# Patient Record
Sex: Female | Born: 1980
Health system: Southern US, Community
[De-identification: ages and names within clinical notes are randomized; demographics above are authoritative.]

## PROBLEM LIST (undated history)

## (undated) ENCOUNTER — Inpatient Hospital Stay (HOSPITAL_COMMUNITY): Payer: Self-pay

## (undated) DIAGNOSIS — N809 Endometriosis, unspecified: Secondary | ICD-10-CM

## (undated) DIAGNOSIS — Z862 Personal history of diseases of the blood and blood-forming organs and certain disorders involving the immune mechanism: Secondary | ICD-10-CM

## (undated) DIAGNOSIS — O24419 Gestational diabetes mellitus in pregnancy, unspecified control: Secondary | ICD-10-CM

## (undated) DIAGNOSIS — Z8742 Personal history of other diseases of the female genital tract: Secondary | ICD-10-CM

## (undated) DIAGNOSIS — F329 Major depressive disorder, single episode, unspecified: Secondary | ICD-10-CM

## (undated) DIAGNOSIS — Z973 Presence of spectacles and contact lenses: Secondary | ICD-10-CM

## (undated) DIAGNOSIS — E7800 Pure hypercholesterolemia, unspecified: Secondary | ICD-10-CM

## (undated) DIAGNOSIS — D649 Anemia, unspecified: Secondary | ICD-10-CM

## (undated) DIAGNOSIS — N801 Endometriosis of ovary: Secondary | ICD-10-CM

## (undated) DIAGNOSIS — F32A Depression, unspecified: Secondary | ICD-10-CM

## (undated) DIAGNOSIS — M7989 Other specified soft tissue disorders: Secondary | ICD-10-CM

## (undated) DIAGNOSIS — Z889 Allergy status to unspecified drugs, medicaments and biological substances status: Secondary | ICD-10-CM

## (undated) DIAGNOSIS — E785 Hyperlipidemia, unspecified: Secondary | ICD-10-CM

## (undated) DIAGNOSIS — F419 Anxiety disorder, unspecified: Secondary | ICD-10-CM

## (undated) DIAGNOSIS — O132 Gestational [pregnancy-induced] hypertension without significant proteinuria, second trimester: Secondary | ICD-10-CM

## (undated) DIAGNOSIS — E559 Vitamin D deficiency, unspecified: Secondary | ICD-10-CM

## (undated) DIAGNOSIS — E282 Polycystic ovarian syndrome: Secondary | ICD-10-CM

## (undated) HISTORY — DX: Vitamin D deficiency, unspecified: E55.9

## (undated) HISTORY — PX: OVARIAN CYST REMOVAL: SHX89

## (undated) HISTORY — DX: Other specified soft tissue disorders: M79.89

## (undated) HISTORY — DX: Pure hypercholesterolemia, unspecified: E78.00

## (undated) HISTORY — DX: Hyperlipidemia, unspecified: E78.5

## (undated) HISTORY — PX: LAPAROSCOPIC OVARIAN CYSTECTOMY: SUR786

## (undated) HISTORY — DX: Endometriosis, unspecified: N80.9

## (undated) HISTORY — DX: Anxiety disorder, unspecified: F41.9

## (undated) HISTORY — DX: Depression, unspecified: F32.A

## (undated) HISTORY — DX: Polycystic ovarian syndrome: E28.2

## (undated) HISTORY — PX: OTHER SURGICAL HISTORY: SHX169

## (undated) HISTORY — DX: Major depressive disorder, single episode, unspecified: F32.9

---

## 2011-12-11 HISTORY — PX: OTHER SURGICAL HISTORY: SHX169

## 2013-09-09 LAB — HM PAP SMEAR: HM Pap smear: NORMAL

## 2014-08-12 ENCOUNTER — Telehealth: Payer: Self-pay

## 2014-08-12 NOTE — Telephone Encounter (Signed)
PCP: [facility]  No previous records on file. Pt encouraged to have records faxed to office.  Pt only wants to establish care during visit.  Pt has already had her annual exam and labs.    Medication and allergies:  Reviewed and updated  90 day supply/mail order: n/a Local pharmacy:  [facility], [place] - [address]     Immunizations due:  Please see below  A/P: Personal, family history and past surgical hx: Reviewed and updated PAP- 09/2013 Flu- pt states she has already received for work Tdap- 03/2014   To Discuss with Provider: Nothing at this time.

## 2014-08-13 ENCOUNTER — Ambulatory Visit: Payer: Self-pay | Admitting: Family Medicine

## 2014-11-22 ENCOUNTER — Encounter: Payer: Self-pay | Admitting: Family Medicine

## 2014-11-22 ENCOUNTER — Ambulatory Visit (INDEPENDENT_AMBULATORY_CARE_PROVIDER_SITE_OTHER): Payer: 59 | Admitting: Family Medicine

## 2014-11-22 VITALS — BP 120/80 | HR 76 | Temp 98.2°F | Resp 16 | Ht 62.0 in | Wt 229.0 lb

## 2014-11-22 DIAGNOSIS — E669 Obesity, unspecified: Secondary | ICD-10-CM | POA: Insufficient documentation

## 2014-11-22 DIAGNOSIS — N926 Irregular menstruation, unspecified: Secondary | ICD-10-CM

## 2014-11-22 DIAGNOSIS — Z6838 Body mass index (BMI) 38.0-38.9, adult: Secondary | ICD-10-CM | POA: Insufficient documentation

## 2014-11-22 DIAGNOSIS — F411 Generalized anxiety disorder: Secondary | ICD-10-CM

## 2014-11-22 DIAGNOSIS — E785 Hyperlipidemia, unspecified: Secondary | ICD-10-CM | POA: Insufficient documentation

## 2014-11-22 NOTE — Progress Notes (Signed)
   Subjective:    Patient ID: Cynthia [name], female    DOB: 1981/01/28, 33 y.o.   MRN: [id number]  HPI New to establish.  Previous MD- Lin Landsman  Hyperlipidemia- chronic problem, on Pravastatin.  + family hx.  Denies abd pain, N/V, myalgias.  Anxiety- chronic problem.  Started shortly after father's death.  Came off meds and then restarted.  On Citalopram 10 mg daily (1/2 tab)  Irregular menses- ongoing issue for pt.  Pt is attempting to get pregnant.  Has tried for ~6 months.  Obese- pt is interested in seeing Nutritionist.  'massive weight gain' in the last 4 yrs.  Was exercising regularly up until recently.  Has gained ~100 lbs.  Review of Systems For ROS see HPI     Objective:   Physical Exam  Constitutional: She is oriented to person, place, and time. She appears well-developed and well-nourished. No distress.  obese  HENT:  Head: Normocephalic and atraumatic.  Eyes: Conjunctivae and EOM are normal. Pupils are equal, round, and reactive to light.  Neck: Normal range of motion. Neck supple. No thyromegaly present.  Cardiovascular: Normal rate, regular rhythm, normal heart sounds and intact distal pulses.   No murmur heard. Pulmonary/Chest: Effort normal and breath sounds normal. No respiratory distress.  Abdominal: Soft. She exhibits no distension. There is no tenderness.  Musculoskeletal: She exhibits no edema.  Lymphadenopathy:    She has no cervical adenopathy.  Neurological: She is alert and oriented to person, place, and time.  Skin: Skin is warm and dry.  Psychiatric: She has a normal mood and affect. Her behavior is normal.  Vitals reviewed.         Assessment & Plan:

## 2014-11-22 NOTE — Progress Notes (Signed)
Pre visit review using our clinic review tool, if applicable. No additional management support is needed unless otherwise documented below in the visit note. 

## 2014-11-22 NOTE — Patient Instructions (Signed)
Schedule your complete physical in 6 months Call [facility] and schedule an appt w/ one of their nutritionists We'll call you with your OB appt We'll notify you of your lab results and make any changes if needed Try and make healthy food choices and get regular exercise Call with any questions or concerns Welcome!  We're glad to have you!!! Happy Holidays!!!

## 2014-11-23 ENCOUNTER — Encounter: Payer: Self-pay | Admitting: Family Medicine

## 2014-11-23 LAB — CBC WITH DIFFERENTIAL/PLATELET
BASOS ABS: 0 10*3/uL (ref 0.0–0.1)
BASOS PCT: 0.3 % (ref 0.0–3.0)
EOS ABS: 0.3 10*3/uL (ref 0.0–0.7)
Eosinophils Relative: 3.4 % (ref 0.0–5.0)
HCT: 35.8 % — ABNORMAL LOW (ref 36.0–46.0)
HEMOGLOBIN: 11.6 g/dL — AB (ref 12.0–15.0)
LYMPHS PCT: 25.1 % (ref 12.0–46.0)
Lymphs Abs: 2.5 10*3/uL (ref 0.7–4.0)
MCHC: 32.5 g/dL (ref 30.0–36.0)
MCV: 78.4 fl (ref 78.0–100.0)
MONOS PCT: 4.8 % (ref 3.0–12.0)
Monocytes Absolute: 0.5 10*3/uL (ref 0.1–1.0)
Neutro Abs: 6.6 10*3/uL (ref 1.4–7.7)
Neutrophils Relative %: 66.4 % (ref 43.0–77.0)
Platelets: 405 10*3/uL — ABNORMAL HIGH (ref 150.0–400.0)
RBC: 4.57 Mil/uL (ref 3.87–5.11)
RDW: 16.1 % — AB (ref 11.5–15.5)
WBC: 9.9 10*3/uL (ref 4.0–10.5)

## 2014-11-23 LAB — LDL CHOLESTEROL, DIRECT: Direct LDL: 148.5 mg/dL

## 2014-11-23 LAB — BASIC METABOLIC PANEL
BUN: 10 mg/dL (ref 6–23)
CALCIUM: 9.2 mg/dL (ref 8.4–10.5)
CO2: 21 meq/L (ref 19–32)
Chloride: 106 mEq/L (ref 96–112)
Creatinine, Ser: 0.7 mg/dL (ref 0.4–1.2)
GFR: 100.04 mL/min (ref >60.00)
GLUCOSE: 80 mg/dL (ref 70–99)
Potassium: 4 mEq/L (ref 3.5–5.1)
SODIUM: 137 meq/L (ref 135–145)

## 2014-11-23 LAB — HEPATIC FUNCTION PANEL
ALBUMIN: 4.3 g/dL (ref 3.5–5.2)
ALT: 20 U/L (ref 0–35)
AST: 22 U/L (ref 0–37)
Alkaline Phosphatase: 68 U/L (ref 39–117)
Bilirubin, Direct: 0 mg/dL (ref 0.0–0.3)
TOTAL PROTEIN: 7.6 g/dL (ref 6.0–8.3)
Total Bilirubin: 0.3 mg/dL (ref 0.2–1.2)

## 2014-11-23 LAB — LIPID PANEL
CHOLESTEROL: 214 mg/dL — AB (ref 0–200)
HDL: 38 mg/dL — AB (ref >39.00)
NonHDL: 176
TRIGLYCERIDES: 203 mg/dL — AB (ref 0.0–149.0)
Total CHOL/HDL Ratio: 6
VLDL: 40.6 mg/dL — AB (ref 0.0–40.0)

## 2014-11-23 LAB — TSH: TSH: 2.22 u[IU]/mL (ref 0.35–4.50)

## 2014-11-23 NOTE — Assessment & Plan Note (Signed)
New.  Check labs to risk stratify.  Stressed need for healthy diet, regular exercise.  Discussed plan for pt to see nutritionist- names and #s provided.  Will follow.

## 2014-11-23 NOTE — Assessment & Plan Note (Signed)
Chronic problem.  Tolerating statin w/o difficulty.  Admits to excessive recent weight gain and limited exercise.  Pt plans to get back on track w/ both.  She would like to get off meds b/c she would like to get pregnant.  Will follow.

## 2014-11-23 NOTE — Assessment & Plan Note (Signed)
New to provider, ongoing for pt.  Will refer to OB/GYN as pt desires to get pregnant.

## 2014-11-23 NOTE — Assessment & Plan Note (Signed)
New to provider, ongoing for pt.  Adequate control.  Pt is considering weaning off meds in the near future but w/ recent career change, she is not going to do it now.  Will follow.

## 2014-11-24 ENCOUNTER — Encounter: Payer: Self-pay | Admitting: General Practice

## 2014-12-08 ENCOUNTER — Telehealth: Payer: Self-pay | Admitting: Family Medicine

## 2014-12-08 ENCOUNTER — Encounter: Payer: Self-pay | Admitting: Family Medicine

## 2014-12-08 NOTE — Telephone Encounter (Signed)
Ok for referral?

## 2014-12-08 NOTE — Telephone Encounter (Signed)
Caller name:Glandon, Alandria Relation to MQ:TTCN Call back number:[phone] Pharmacy:  Reason for call: pt states she needs a referral  For the nutritionist, pt has [facility], [facility] [phone].

## 2014-12-08 NOTE — Telephone Encounter (Signed)
Referral placed.

## 2014-12-18 ENCOUNTER — Ambulatory Visit (INDEPENDENT_AMBULATORY_CARE_PROVIDER_SITE_OTHER): Payer: 59 | Admitting: Family Medicine

## 2014-12-18 ENCOUNTER — Encounter: Payer: Self-pay | Admitting: Family Medicine

## 2014-12-18 VITALS — BP 102/78 | HR 97 | Temp 98.1°F | Wt 229.0 lb

## 2014-12-18 DIAGNOSIS — N921 Excessive and frequent menstruation with irregular cycle: Secondary | ICD-10-CM

## 2014-12-18 DIAGNOSIS — D508 Other iron deficiency anemias: Secondary | ICD-10-CM

## 2014-12-18 MED ORDER — MEDROXYPROGESTERONE ACETATE 10 MG PO TABS: 10.0000 mg | ORAL_TABLET | Freq: Two times a day (BID) | ORAL | Status: DC

## 2014-12-18 NOTE — Progress Notes (Addendum)
[facility] PCP Annye Asa, MD   Subjective:  Cynthia [name] is a 34 y.o. year old very pleasant female patient who presents with prolonged menstrual bleeding. She is present with her husband Dr. Tomi Likens of neurology.  LMP 11/30/14. Initially very heavy with chanign tampon every hour but has slowed to 4-5 tampons per day. Patient had an ob/gyn appointment scheduled but this had to be rescheduled for uncear reasons. When she was in the office, she mentioned her complaints and was advised to take an iron which she is taking 1 a day of. She states they did this based off a finger prick where they told her that her iron was low (? If this was POC CBC). Patient denies hgb being checked. Patient is not on birth control and states was last on them in 2004. No children. Periods susspicious for anovulatory bleeding as occur about ever 2 months and heavy for about 4 days. Denies history of PCOS. She has been able to go to work. She does get intermittently dizzy but is not dizzy with standing. Mild difficulty concentrating. Mild swelling in hands and at ankles. Patient with known mild anemia on last CBC.   ROS-denies  Shortness of breath or dyspnea on exertion. Patient is currently experiencing some URI symptoms which are improving and does have some fatigue (difficult to tease out level of fatigue and also more difficult to interpret given URI). She has no chest pain.   Past Medical History- history BPPV,  Anxiety, hyperlipidemia  Medications- reviewed  Current Outpatient Prescriptions  Medication Sig Dispense Refill  . citalopram (CELEXA) 20 MG tablet Take 0.5 tablets by mouth daily.  4  . pravastatin (PRAVACHOL) 40 MG tablet Take 1 tablet by mouth daily.  3   No current facility-administered medications for this visit.    Objective: BP 102/78 mmHg  Pulse 97  Temp(Src) 98.1 F (36.7 C) (Oral)  Wt 229 lb (103.874 kg)  SpO2 97% Gen: NAD, resting comfortably HEENT: only  mild mucus membrane pallor CV: RRR no murmurs rubs or gallops Lungs: CTAB no crackles, wheeze, rhonchi Ext:  Trace to 1+ pitting edema as well as swelling in hands Neuro: walks without difficulty   Assessment/Plan:  Prolonged menstrual bleeding Provera 10mg  BID. Suspicious for anovulatory bleeding and unopposed estrogen. Progesterone to stabilize uterine lining. We do not have hgb/cbc capability in office today. Given improving frequency of bleeding, we decided to hold off on sending to ED as long as no worsening of fatigue and if patient continues without DOE, SOB, chest pain. Close follow up Monday with Dr. Birdie Riddle. Patient does have BPPV and intermittent dizziness which seems to be worse with anemia-we discussed seeking care if this increased in frequncy before Monday.   Regarding anemia, baseline hgb at 11.6 with MCV at 78.4 and elevated RDW suspicious for possible baseline anemia. Patient states her iron level was 8 at ob/gyn (hopeful this was in fact iron and not hgb level). I encouraged her to continue her iron and suspect Dr. Birdie Riddle will increase frequency.   Meds ordered this encounter  Medications  . medroxyPROGESTERone (PROVERA) 10 MG tablet    Sig: Take 1 tablet (10 mg total) by mouth 2 (two) times daily.    Dispense:  20 tablet    Refill:  0

## 2014-12-18 NOTE — Patient Instructions (Signed)
Provera for at least 5 days twice a day. Take 10 days if not fully resolved.   See Dr. Birdie Riddle this week. Likely will get a CBC/hgb to check your blood counts.   Continue the iron.   If you had worsening dizziness, new chest pain or shortness of breath or worsening fatigue consider going to an  ED where they can check your hgb and transfuse if needed.

## 2014-12-18 NOTE — Progress Notes (Signed)
Pre visit review using our clinic review tool, if applicable. No additional management support is needed unless otherwise documented below in the visit note. 

## 2014-12-20 ENCOUNTER — Telehealth: Payer: Self-pay | Admitting: Family Medicine

## 2014-12-20 ENCOUNTER — Ambulatory Visit (INDEPENDENT_AMBULATORY_CARE_PROVIDER_SITE_OTHER): Payer: 59 | Admitting: Medical

## 2014-12-20 VITALS — BP 119/80 | HR 109 | Temp 98.5°F | Ht 62.0 in | Wt 229.2 lb

## 2014-12-20 DIAGNOSIS — D6489 Other specified anemias: Secondary | ICD-10-CM

## 2014-12-20 DIAGNOSIS — J069 Acute upper respiratory infection, unspecified: Secondary | ICD-10-CM

## 2014-12-20 DIAGNOSIS — D649 Anemia, unspecified: Secondary | ICD-10-CM | POA: Insufficient documentation

## 2014-12-20 DIAGNOSIS — N921 Excessive and frequent menstruation with irregular cycle: Secondary | ICD-10-CM | POA: Insufficient documentation

## 2014-12-20 LAB — CBC WITH DIFFERENTIAL/PLATELET
Basophils Absolute: 0 10*3/uL (ref 0.0–0.1)
Basophils Relative: 0 % (ref 0–1)
Eosinophils Absolute: 0.3 10*3/uL (ref 0.0–0.7)
Eosinophils Relative: 3 % (ref 0–5)
HCT: 29.3 % — ABNORMAL LOW (ref 36.0–46.0)
Hemoglobin: 9.6 g/dL — ABNORMAL LOW (ref 12.0–15.0)
LYMPHS ABS: 2.9 10*3/uL (ref 0.7–4.0)
LYMPHS PCT: 32 % (ref 12–46)
MCH: 25.9 pg — ABNORMAL LOW (ref 26.0–34.0)
MCHC: 32.8 g/dL (ref 30.0–36.0)
MCV: 79 fL (ref 78.0–100.0)
MONOS PCT: 6 % (ref 3–12)
MPV: 8.1 fL — AB (ref 8.6–12.4)
Monocytes Absolute: 0.6 10*3/uL (ref 0.1–1.0)
Neutro Abs: 5.4 10*3/uL (ref 1.7–7.7)
Neutrophils Relative %: 59 % (ref 43–77)
Platelets: 468 10*3/uL — ABNORMAL HIGH (ref 150–400)
RBC: 3.71 MIL/uL — ABNORMAL LOW (ref 3.87–5.11)
RDW: 16.8 % — AB (ref 11.5–15.5)
WBC: 9.2 10*3/uL (ref 4.0–10.5)

## 2014-12-20 LAB — IRON AND TIBC
%SAT: 5 % — ABNORMAL LOW (ref 20–55)
Iron: 22 ug/dL — ABNORMAL LOW (ref 42–145)
TIBC: 430 ug/dL (ref 250–470)
UIBC: 408 ug/dL — ABNORMAL HIGH (ref 125–400)

## 2014-12-20 LAB — FOLATE: Folate: >20 ng/mL

## 2014-12-20 LAB — VITAMIN B12: Vitamin B-12: 475 pg/mL (ref 211–911)

## 2014-12-20 LAB — FERRITIN: Ferritin: 10 ng/mL (ref 10–291)

## 2014-12-20 NOTE — Assessment & Plan Note (Signed)
For your uri type symptoms continue mucinex and flonase. If you have sinus pressure/pain, productive cough or ear pain let me know and would rx antibiotic. I don't think antibiotics indicated presently.

## 2014-12-20 NOTE — Progress Notes (Signed)
Pre visit review using our clinic review tool, if applicable. No additional management support is needed unless otherwise documented below in the visit note. 

## 2014-12-20 NOTE — Patient Instructions (Addendum)
For your irregular and  heavy menses, I will get stat cbc and anemia profile studies.  Continue the provera and follow up with your gyn on the 19th.  If your bleeding were to worsen before the gyn appointment then ED evaluation.  For your uri type symptoms continue mucinex and flonase. If you have sinus pressure/pain, productive cough or ear pain let me know and would rx antibiotic. I don't think antibiotics indicated presently.  Follow up in 3 wks pcp or as needed.  Repeat cbc stat on Wed am. Will actually have lpn ask pt to make appointnment that day so can check bs along with repeat cbc.

## 2014-12-20 NOTE — Assessment & Plan Note (Signed)
For your irregular and  heavy menses, I will get stat cbc and anemia profile studies.  Continue the provera and follow up with your gyn on the 19th.  If your bleeding were to worsen before the gyn appointment then ED evaluation.

## 2014-12-20 NOTE — Assessment & Plan Note (Addendum)
Recently made worse by heavy menses. Reviewed her lab today. We will need to repeat her cbc on Thursday morning and assure that hb/hct is stable. Make sure not dropping further. She is now on iron and provera has decreased the bleeding. If continuous drop in hb/hct then would likely need transfusion.

## 2014-12-20 NOTE — Telephone Encounter (Signed)
Pt anemia level needs to be checked. Placing cbc order to be done again on wed this week.

## 2014-12-20 NOTE — Progress Notes (Signed)
Subjective:    Patient ID: Cynthia [name], female    DOB: 02/10/1981, 34 y.o.   MRN: [id number]  HPI   LMP- started on December 22nd. Pt states states hx of irregular cycles. And was very heavy cycle on 22nd. Heavy cycle and  Dr. Yong Channel saw pt weekend clinic started her on provera 10 mg and he wrote her for 7-10 days. Pt bleeding decreased significantly. Before she went to see Dr. Geoffry Paradise she was changing tampons every hour. But since then Saturday after starting provera. She only changes tampon twice since that day. Pt had appointment with gyn set up with them on last Monday. Some confusion on that appointment. So she has reschedule on the 19 th. Pt has some dizziness. Pt has been taking iron supplements. Pt denies hx of anemia. No history of uterine fibroids.  Pt states on Monday last week checked and she was not pregnant.  Pt feels mild fatigue and dizzy.   But also over past 10 days, cough, congestion, runny nose, pnd. andnd subjective fever. Some sneezing and no itchy eyes.  Past Medical History  Diagnosis Date  . Ovarian cyst   . Depression   . Anxiety   . Hypertension   . Hyperlipidemia   . Endometriosis     History   Social History  . Marital Status: Married    Spouse Name: N/A    Number of Children: N/A  . Years of Education: N/A   Occupational History  . Not on file.   Social History Main Topics  . Smoking status: Former Smoker    Quit date: 11/23/2011  . Smokeless tobacco: Not on file  . Alcohol Use: Yes  . Drug Use: No  . Sexual Activity: Not on file   Other Topics Concern  . Not on file   Social History Narrative    Past Surgical History  Procedure Laterality Date  . Ovarian cyst removal      Family History  Problem Relation Age of Onset  . Cancer Mother     breast  . Diabetes Mother     and mother's side of family  . Hyperlipidemia Mother   . Cancer Father     thyroid  . Heart disease Maternal Uncle   . Hyperlipidemia Maternal Grandfather     . Hypertension Maternal Grandfather   . Diabetes Maternal Grandfather     No Known Allergies  Current Outpatient Prescriptions on File Prior to Visit  Medication Sig Dispense Refill  . citalopram (CELEXA) 20 MG tablet Take 0.5 tablets by mouth daily.  4  . medroxyPROGESTERone (PROVERA) 10 MG tablet Take 1 tablet (10 mg total) by mouth 2 (two) times daily. 20 tablet 0  . pravastatin (PRAVACHOL) 40 MG tablet Take 1 tablet by mouth daily.  3   No current facility-administered medications on file prior to visit.    BP 119/80 mmHg  Pulse 109  Temp(Src) 98.5 F (36.9 C) (Oral)  Ht 5\' 2"  (1.575 m)  Wt 229 lb 3.2 oz (103.964 kg)  BMI 41.91 kg/m2  SpO2 98%      Review of Systems  Constitutional: Positive for fatigue. Negative for fever and chills.       Subjective fever early on.  HENT: Positive for congestion and rhinorrhea. Negative for dental problem, drooling, ear discharge and ear pain.   Respiratory: Positive for cough.   Cardiovascular: Negative for chest pain and palpitations.  Gastrointestinal: Negative for abdominal pain, diarrhea, constipation, blood in stool, abdominal distention  and anal bleeding.  Neurological: Positive for dizziness.       Objective:   Physical Exam   General  Mental Status - Alert. General Appearance - Well groomed. Not in acute distress.  Skin Rashes- No Rashes. Not pale appearing skin.  HEENT Head- Normal. Ear Auditory Canal - Left- Normal. Right - Normal.Tympanic Membrane- Left- Normal. Right- Normal. Eye Sclera/Conjunctiva- Left- Normal. Right- Normal. Nose & Sinuses Nasal Mucosa- Left-  Mild boggy + Congested. Right-  Mild boggy + Congested. No sinus pressure. Mouth & Throat Lips: Upper Lip- Normal: no dryness, cracking, pallor, cyanosis, or vesicular eruption. Lower Lip-Normal: no dryness, cracking, pallor, cyanosis or vesicular eruption. Buccal Mucosa- Bilateral- No Aphthous ulcers. Oropharynx- No Discharge or  Erythema. Tonsils: Characteristics- Bilateral- No Erythema or Congestion. Size/Enlargement- Bilateral- No enlargement. Discharge- bilateral-None.  Neck Neck- Supple. No Masses.   Chest and Lung Exam Auscultation: Breath Sounds:- even and unlabored.  Cardiovascular Auscultation:Rythm- Regular, rate and rhythm. Murmurs & Other Heart Sounds:Ausculatation of the heart reveal- No Murmurs.  Lymphatic Head & Neck General Head & Neck Lymphatics: Bilateral: Description- No Localized lymphadenopathy.   Abdomen Inspection:-Inspection Normal.  Palpation/Perucssion: Palpation and Percussion of the abdomen reveal- Non Tender, No Rebound tenderness, No rigidity(Guarding) and No Palpable abdominal masses.  Liver:-Normal.  Spleen:- Normal.    Back- no cva tenderness. .        Assessment & Plan:

## 2014-12-21 NOTE — Telephone Encounter (Signed)
Patient has appointment scheduled for Wednesday 7am for labs and 3:30 pm for OV. States she has to come before and after classes.

## 2014-12-22 ENCOUNTER — Ambulatory Visit: Payer: 59 | Admitting: Medical

## 2014-12-22 ENCOUNTER — Other Ambulatory Visit: Payer: 59

## 2014-12-23 ENCOUNTER — Ambulatory Visit (INDEPENDENT_AMBULATORY_CARE_PROVIDER_SITE_OTHER): Payer: 59 | Admitting: Medical

## 2014-12-23 ENCOUNTER — Encounter: Payer: Self-pay | Admitting: Medical

## 2014-12-23 ENCOUNTER — Other Ambulatory Visit: Payer: 59

## 2014-12-23 ENCOUNTER — Telehealth: Payer: Self-pay | Admitting: Family Medicine

## 2014-12-23 VITALS — BP 148/84 | HR 103 | Temp 98.1°F | Ht 62.0 in | Wt 229.4 lb

## 2014-12-23 DIAGNOSIS — D6489 Other specified anemias: Secondary | ICD-10-CM

## 2014-12-23 DIAGNOSIS — D649 Anemia, unspecified: Secondary | ICD-10-CM

## 2014-12-23 DIAGNOSIS — N921 Excessive and frequent menstruation with irregular cycle: Secondary | ICD-10-CM

## 2014-12-23 LAB — CBC WITH DIFFERENTIAL/PLATELET
BASOS ABS: 0 10*3/uL (ref 0.0–0.1)
BASOS PCT: 0 % (ref 0–1)
EOS PCT: 3 % (ref 0–5)
Eosinophils Absolute: 0.3 10*3/uL (ref 0.0–0.7)
HCT: 29.1 % — ABNORMAL LOW (ref 36.0–46.0)
HEMOGLOBIN: 9.5 g/dL — AB (ref 12.0–15.0)
LYMPHS ABS: 3 10*3/uL (ref 0.7–4.0)
LYMPHS PCT: 33 % (ref 12–46)
MCH: 26 pg (ref 26.0–34.0)
MCHC: 32.6 g/dL (ref 30.0–36.0)
MCV: 79.7 fL (ref 78.0–100.0)
MONOS PCT: 5 % (ref 3–12)
MPV: 8.1 fL — ABNORMAL LOW (ref 8.6–12.4)
Monocytes Absolute: 0.5 10*3/uL (ref 0.1–1.0)
NEUTROS PCT: 59 % (ref 43–77)
Neutro Abs: 5.4 10*3/uL (ref 1.7–7.7)
Platelets: 494 10*3/uL — ABNORMAL HIGH (ref 150–400)
RBC: 3.65 MIL/uL — ABNORMAL LOW (ref 3.87–5.11)
RDW: 16.6 % — AB (ref 11.5–15.5)
WBC: 9.1 10*3/uL (ref 4.0–10.5)

## 2014-12-23 MED ORDER — FERROUS SULFATE 325 (65 FE) MG PO TABS: 325.0000 mg | ORAL_TABLET | Freq: Three times a day (TID) | ORAL | Status: DC

## 2014-12-23 NOTE — Progress Notes (Signed)
Pre visit review using our clinic review tool, if applicable. No additional management support is needed unless otherwise documented below in the visit note. 

## 2014-12-23 NOTE — Patient Instructions (Addendum)
For your anemia related to your heavy cycle. I want you to take the iron I prescribed 3 times daily. Continue the provera. Repeat cbc stat on Monday morning. If during the interim you start bleeding heavy or increased fatigue then ED evaluation. We will let you know on Monday regarding  your lab results. Tuesday after your gyn appointment please call Santiago Glad LPN and let us know what gyn found. Follow up Thursday next week with me.

## 2014-12-23 NOTE — Telephone Encounter (Signed)
error:[id number] ° °

## 2014-12-23 NOTE — Progress Notes (Signed)
Subjective:    Patient ID: Cynthia [name], female    DOB: 04-Jul-1981, 34 y.o.   MRN: [id number]  HPI   Pt in stating still mild fatigue. Not better or worse. But also one night only got 3 hours of sleep. Pt vaginal bleeding did stop but then started bleeding again. She stopped provera yesterday. Tuesday on had only streaking. Pt taking otc iron but this is mothers. Pt stopped the provera yesterday but then bleeding restarted today. Pt has very little bleeding(1/2 a teas spoon) today. Pt restared provera today and already stopped. Pt has enough to last until she see gyn on this coming Tuesday.  When I checked her cbc initially it was 3 days after seen at [facility]. She had 1 dose of provera and iron at that time.   Past Medical History  Diagnosis Date  . Ovarian cyst   . Depression   . Anxiety   . Hypertension   . Hyperlipidemia   . Endometriosis     History   Social History  . Marital Status: Married    Spouse Name: N/A    Number of Children: N/A  . Years of Education: N/A   Occupational History  . Not on file.   Social History Main Topics  . Smoking status: Former Smoker    Quit date: 11/23/2011  . Smokeless tobacco: Not on file  . Alcohol Use: Yes  . Drug Use: No  . Sexual Activity: Not on file   Other Topics Concern  . Not on file   Social History Narrative    Past Surgical History  Procedure Laterality Date  . Ovarian cyst removal      Family History  Problem Relation Age of Onset  . Cancer Mother     breast  . Diabetes Mother     and mother's side of family  . Hyperlipidemia Mother   . Cancer Father     thyroid  . Heart disease Maternal Uncle   . Hyperlipidemia Maternal Grandfather   . Hypertension Maternal Grandfather   . Diabetes Maternal Grandfather     No Known Allergies  Current Outpatient Prescriptions on File Prior to Visit  Medication Sig Dispense Refill  . citalopram (CELEXA) 20 MG tablet Take 0.5 tablets by mouth daily.  4  .  medroxyPROGESTERone (PROVERA) 10 MG tablet Take 1 tablet (10 mg total) by mouth 2 (two) times daily. 20 tablet 0  . pravastatin (PRAVACHOL) 40 MG tablet Take 1 tablet by mouth daily.  3   No current facility-administered medications on file prior to visit.    BP 148/84 mmHg  Pulse 103  Temp(Src) 98.1 F (36.7 C) (Oral)  Ht 5\' 2"  (1.575 m)  Wt 229 lb 6.4 oz (104.055 kg)  BMI 41.95 kg/m2  SpO2 100%  LMP 12/23/2014      Review of Systems  Constitutional: Positive for fatigue. Negative for fever and chills.       Mild.  Respiratory: Negative for cough, chest tightness, shortness of breath and wheezing.   Cardiovascular: Negative for chest pain and palpitations.  Gastrointestinal: Negative for nausea, abdominal pain, diarrhea, constipation, blood in stool, anal bleeding and rectal pain.  Genitourinary: Positive for vaginal bleeding and menstrual problem. Negative for dysuria, frequency, hematuria, flank pain, vaginal discharge, enuresis, difficulty urinating, genital sores, vaginal pain and dyspareunia.       See hop.  Neurological: Positive for dizziness. Negative for seizures, syncope, speech difficulty, weakness, light-headedness, numbness and headaches.  Hematological: Negative for adenopathy.  Does not bruise/bleed easily.       Objective:   Physical Exam   General Mental Status- Alert. General Appearance- Not in acute distress.   Skin General: Color- Normal Color. Moisture- Normal Moisture. No pale appearance.  Neck Carotid Arteries- Normal color. Moisture- Normal Moisture. No carotid bruits. No JVD.  Chest and Lung Exam Auscultation: Breath Sounds:-Normal.  Cardiovascular Auscultation:Rythm- Regular. Murmurs & Other Heart Sounds:Auscultation of the heart reveals- No Murmurs.  Abdomen Inspection:-Inspeection Normal. Palpation/Percussion:Note:No mass. Palpation and Percussion of the abdomen reveal- Non Tender, Non Distended + BS, no rebound or  guarding.    Neurologic Cranial Nerve exam:- CN III-XII intact(No nystagmus), symmetric smile. Romberg Exam:- Negative.  Heal to Toe Gait exam:-Normal.          Assessment & Plan:

## 2014-12-23 NOTE — Assessment & Plan Note (Addendum)
Bleeding for most part stopped. Her anemia is stable with no drop in 2 days. I do not think at transfusion level.(most part looks good and hemodynamically stable.) For your anemia related to your heavy cycle. I want you to take the iron I prescribed 3 times daily. Continue the provera. Repeat cbc stat on Monday morning. If during the interim you start bleeding heavy or increased fatigue then ED evaluation. We will let you know on Monday regarding  your lab results. Tuesday after your gyn appointment please call Santiago Glad LPN and let us know what gyn found. Follow up Thursday next week with me.  On recheck in 2 days hoping and expected mild increae hb/hct. And maybe get result of possible pelvic US by gyn on Tuesday.

## 2014-12-24 ENCOUNTER — Other Ambulatory Visit: Payer: Self-pay | Admitting: General Practice

## 2014-12-24 MED ORDER — PRAVASTATIN SODIUM 40 MG PO TABS: 40.0000 mg | ORAL_TABLET | Freq: Every day | ORAL | Status: DC

## 2014-12-27 ENCOUNTER — Other Ambulatory Visit: Payer: 59

## 2014-12-27 ENCOUNTER — Telehealth: Payer: Self-pay | Admitting: Medical

## 2014-12-27 DIAGNOSIS — D62 Acute posthemorrhagic anemia: Secondary | ICD-10-CM

## 2014-12-27 DIAGNOSIS — D508 Other iron deficiency anemias: Secondary | ICD-10-CM

## 2014-12-27 LAB — CBC WITH DIFFERENTIAL/PLATELET
Basophils Absolute: 0 10*3/uL (ref 0.0–0.1)
Basophils Relative: 0 % (ref 0–1)
EOS PCT: 2 % (ref 0–5)
Eosinophils Absolute: 0.2 10*3/uL (ref 0.0–0.7)
HCT: 30.6 % — ABNORMAL LOW (ref 36.0–46.0)
Hemoglobin: 10 g/dL — ABNORMAL LOW (ref 12.0–15.0)
Lymphocytes Relative: 28 % (ref 12–46)
Lymphs Abs: 2.3 10*3/uL (ref 0.7–4.0)
MCH: 26.2 pg (ref 26.0–34.0)
MCHC: 32.7 g/dL (ref 30.0–36.0)
MCV: 80.3 fL (ref 78.0–100.0)
MPV: 8.1 fL — ABNORMAL LOW (ref 8.6–12.4)
Monocytes Absolute: 0.5 10*3/uL (ref 0.1–1.0)
Monocytes Relative: 6 % (ref 3–12)
NEUTROS ABS: 5.2 10*3/uL (ref 1.7–7.7)
Neutrophils Relative %: 64 % (ref 43–77)
PLATELETS: 499 10*3/uL — AB (ref 150–400)
RBC: 3.81 MIL/uL — ABNORMAL LOW (ref 3.87–5.11)
RDW: 17.1 % — ABNORMAL HIGH (ref 11.5–15.5)
WBC: 8.2 10*3/uL (ref 4.0–10.5)

## 2014-12-27 LAB — FERRITIN: Ferritin: 45 ng/mL (ref 10–291)

## 2014-12-27 LAB — IRON AND TIBC
%SAT: 37 % (ref 20–55)
Iron: 158 ug/dL — ABNORMAL HIGH (ref 42–145)
TIBC: 422 ug/dL (ref 250–470)
UIBC: 264 ug/dL (ref 125–400)

## 2014-12-27 NOTE — Telephone Encounter (Signed)
Caller name: Nakkia Relation to pt: Call back number:[phone] Pharmacy:  Reason for call: Pt came in office wanting to know if she can still workout doing spinning at least 45 min a day. She wants to know if it will be ok for her to do that or if it will affected her health like passing out. Please advice.

## 2014-12-27 NOTE — Telephone Encounter (Signed)
Advise on iron

## 2014-12-30 ENCOUNTER — Other Ambulatory Visit: Payer: Self-pay

## 2014-12-30 ENCOUNTER — Ambulatory Visit: Payer: 59 | Admitting: Family Medicine

## 2014-12-30 NOTE — Telephone Encounter (Signed)
I want pt to repeat cbc in 10 days. Make sure hb/hct still improving.

## 2015-01-03 NOTE — Telephone Encounter (Signed)
I called pt twice. The number that I have states person unavailable and I was unable to leave message. So will send message to my nurse asking her to come in not for appointment but to repeat the cbc. She has future order in computer but in light of recent repeat vaginal bleeding want to see where her hb/hct are now. Pt has call her gyn since they are trying to control the bleeding.

## 2015-01-03 NOTE — Telephone Encounter (Signed)
PAtient states after starting [facility] medications she has started to bleed heavily. Waiting on GYN to call her back with instructions.

## 2015-01-12 NOTE — Telephone Encounter (Signed)
Called patient. Asked for call back with update on condition(Bleeding).

## 2015-01-12 NOTE — Telephone Encounter (Signed)
Calling to check how pt is and see if she ever got repeat cbc for her anemia. Remind her that I had placed that in the computer.

## 2015-01-14 NOTE — Telephone Encounter (Signed)
Left another message for patient to return call. You may want to compose letter for her because she is not returning phone call.

## 2015-01-17 NOTE — Telephone Encounter (Signed)
Is there standard generic letter that we can send stating we have been trying to contact her. Please call our office.

## 2015-01-23 ENCOUNTER — Telehealth: Payer: Self-pay | Admitting: Medical

## 2015-01-23 NOTE — Telephone Encounter (Signed)
Pt states her bleeding is now controlled. Her hb/hct has increased. Bleeding has decreased. Cyst has been found. Gyn is following. And [place] will be repeated in 2 weeks. Pt is on another ocp which controls her bleeding better. Also some meds for fertility as well.

## 2015-05-19 ENCOUNTER — Telehealth: Payer: Self-pay | Admitting: Family Medicine

## 2015-05-19 NOTE — Telephone Encounter (Signed)
Pre visit letter sent  °

## 2015-06-06 ENCOUNTER — Encounter: Payer: Self-pay | Admitting: Physician Assistant

## 2015-06-06 ENCOUNTER — Encounter: Payer: Self-pay | Admitting: Family Medicine

## 2015-06-06 ENCOUNTER — Ambulatory Visit (INDEPENDENT_AMBULATORY_CARE_PROVIDER_SITE_OTHER): Payer: 59 | Admitting: Physician Assistant

## 2015-06-06 VITALS — BP 122/55 | HR 101 | Temp 98.1°F | Ht 62.0 in | Wt 224.0 lb

## 2015-06-06 DIAGNOSIS — H11443 Conjunctival cysts, bilateral: Secondary | ICD-10-CM

## 2015-06-06 NOTE — Assessment & Plan Note (Signed)
Referral to Ophthalmology placed.  Lubricating drops recommended. Follow-up as scheduled with PCP.

## 2015-06-06 NOTE — Progress Notes (Signed)
    Patient presents to clinic today c/o intermittent "sacs" on her eyes that will rupture and cause watery eyes.  Denies red eyes, vision changes. Has never seen Ophthalmology regarding this.  Denies symptoms at present.  Past Medical History  Diagnosis Date  . Ovarian cyst   . Depression   . Anxiety   . Hypertension   . Hyperlipidemia   . Endometriosis     Current Outpatient Prescriptions on File Prior to Visit  Medication Sig Dispense Refill  . pravastatin (PRAVACHOL) 40 MG tablet Take 1 tablet (40 mg total) by mouth daily. 30 tablet 3  . citalopram (CELEXA) 20 MG tablet Take 0.5 tablets by mouth daily.  4  . ferrous sulfate (FERROUSUL) 325 (65 FE) MG tablet Take 1 tablet (325 mg total) by mouth 3 (three) times daily with meals. (Patient not taking: Reported on 06/06/2015) 90 tablet 0  . medroxyPROGESTERone (PROVERA) 10 MG tablet Take 1 tablet (10 mg total) by mouth 2 (two) times daily. (Patient not taking: Reported on 06/06/2015) 20 tablet 0   No current facility-administered medications on file prior to visit.    No Known Allergies  Family History  Problem Relation Age of Onset  . Cancer Mother     breast  . Diabetes Mother     and mother's side of family  . Hyperlipidemia Mother   . Cancer Father     thyroid  . Heart disease Maternal Uncle   . Hyperlipidemia Maternal Grandfather   . Hypertension Maternal Grandfather   . Diabetes Maternal Grandfather     History   Social History  . Marital Status: Married    Spouse Name: N/A  . Number of Children: N/A  . Years of Education: N/A   Social History Main Topics  . Smoking status: Former Smoker    Quit date: 11/23/2011  . Smokeless tobacco: Not on file  . Alcohol Use: Yes  . Drug Use: No  . Sexual Activity: Not on file   Other Topics Concern  . None   Social History Narrative   Review of Systems - See HPI.  All other ROS are negative.  BP 122/55 mmHg  Pulse 101  Temp(Src) 98.1 F (36.7 C) (Oral)  Ht 5'  2" (1.575 m)  Wt 224 lb (101.606 kg)  BMI 40.96 kg/m2  SpO2 100%  LMP 04/23/2015  Physical Exam  Constitutional: She is oriented to person, place, and time and well-developed, well-nourished, and in no distress.  HENT:  Head: Normocephalic and atraumatic.  Eyes: Conjunctivae and EOM are normal. Pupils are equal, round, and reactive to light.  Cardiovascular: Normal rate, regular rhythm, normal heart sounds and intact distal pulses.   Pulmonary/Chest: Effort normal and breath sounds normal. No respiratory distress. She has no wheezes. She has no rales. She exhibits no tenderness.  Neurological: She is alert and oriented to person, place, and time.  Skin: Skin is warm and dry. No rash noted.  Psychiatric: Affect normal.  Vitals reviewed.  No results found for this or any previous visit (from the past 2160 hour(s)).  Assessment/Plan: Conjunctival cyst of both eyes Referral to Ophthalmology placed.  Lubricating drops recommended. Follow-up as scheduled with PCP.

## 2015-06-06 NOTE — Progress Notes (Signed)
Pre visit review using our clinic review tool, if applicable. No additional management support is needed unless otherwise documented below in the visit note. 

## 2015-06-06 NOTE — Patient Instructions (Signed)
You will be contacted for an appointment with an Ophthalmologist for further assessment.  Get some Visine allergy lubricating drops for itch and hydration. Call if new symptoms develop.

## 2015-06-08 ENCOUNTER — Encounter: Payer: 59 | Admitting: Family Medicine

## 2015-06-09 NOTE — H&P (Signed)
  Patient name Cynthia [name], Cynthia [name] DICTATION#  [id number] CSN# [id number]  Darlyn Chamber, MD 06/09/2015 8:28 AM

## 2015-06-09 NOTE — H&P (Signed)
Cynthia [name], Cynthia [name] NO.:  [id number]  MEDICAL RECORD NO.:  [id number]  LOCATION:                               FACILITY:  [facility]  PHYSICIAN:  Darlyn Chamber, M.D.   DATE OF BIRTH:  1981-01-27  DATE OF ADMISSION:  06/17/2015 DATE OF DISCHARGE:                             HISTORY & PHYSICAL   HISTORY OF PRESENT ILLNESS:  The patient is a 34 year old nulligravida female, who was initially seen in our practice in January of this year. She was having some complaints of irregular cycling and she has had a history of a dermoid tumor of the ovary and a previous laparoscopic cystectomy in 2013.  She underwent ultrasound here finding of a 4-cm ovarian cyst involving the left ovary.  It did not change in size over several months of observation.  It does look like an endometrioma. Because of this, she was desirous of proceeding with laparoscopic evaluation for which she is admitted at the present time.  MEDICATIONS:  She has been on pravastatin, iron sulfate supplementation, and oral Provera.  Also on Lexapro.  PAST SURGICAL HISTORY:  She had a laparoscopic cystectomy in 2013. Otherwise, she has had usual childhood diseases.  SOCIAL HISTORY:  No tobacco and only occasional alcohol use.  FAMILY HISTORY:  Noncontributory.  REVIEW OF SYSTEMS:  Noncontributory.  PHYSICAL EXAMINATION:  VITAL SIGNS:  The patient is afebrile, stable vital signs. HEENT:  The patient is normocephalic.  Pupils are equal, round, and reactive to light and accommodation.  Extraocular movements were intact. Sclerae and conjunctivae were clear.  Oropharynx clear. NECK:  Without thyromegaly. BREASTS:  Not examined. LUNGS:  Clear. CARDIAC:  Regular rhythm and rate without murmurs or gallops.  There were no carotid or abdominal bruits. ABDOMEN:  Benign.  No mass, organomegaly, or tenderness. PELVIC:  Normal external genitalia.  Vaginal mucosa is clear.  Cervix unremarkable.  Uterus normal size,  shape, and contour.  Adnexa unremarkable.  Cannot feel the above-noted cyst. EXTREMITIES:  Trace edema. NEUROLOGIC:  Grossly within normal limits.  IMPRESSION: 1. Cystic enlargement left ovary, possible endometrioma. 2. Prior history of laparoscopic removal of a dermoid tumor.  PLAN:  The patient will undergo laparoscopy, an attempt at ovarian cystectomy.  The risks have been discussed including the risk of infection.  Risk of hemorrhage that could require transfusion with the risk of AIDS or hepatitis.  Risk of injury to adjacent organs including bladder, bowel, ureters that could require further exploratory surgery, risk of deep venous thrombosis and pulmonary embolus.  She understands if there are no adhesions or other conditions, we may not be able to get to the left ovary to complete cystectomy, we will try our best to do evaluation.  All other alternatives including conservative followup were discussed.     Darlyn Chamber, M.D.     JSM/MEDQ  D:  06/09/2015  T:  06/09/2015  Job:  [id number]

## 2015-06-10 ENCOUNTER — Encounter (HOSPITAL_BASED_OUTPATIENT_CLINIC_OR_DEPARTMENT_OTHER): Payer: Self-pay | Admitting: *Deleted

## 2015-06-14 ENCOUNTER — Encounter (HOSPITAL_BASED_OUTPATIENT_CLINIC_OR_DEPARTMENT_OTHER): Payer: Self-pay | Admitting: *Deleted

## 2015-06-14 NOTE — Progress Notes (Signed)
NPO AFTER MN.  ARRIVE AT 0600.  PT GETTING LAB WORK DONE Wednesday 06-15-2015.

## 2015-06-16 ENCOUNTER — Other Ambulatory Visit: Payer: Self-pay | Admitting: General Practice

## 2015-06-16 DIAGNOSIS — Z87891 Personal history of nicotine dependence: Secondary | ICD-10-CM | POA: Diagnosis not present

## 2015-06-16 DIAGNOSIS — N801 Endometriosis of ovary: Secondary | ICD-10-CM | POA: Diagnosis not present

## 2015-06-16 DIAGNOSIS — N809 Endometriosis, unspecified: Secondary | ICD-10-CM | POA: Diagnosis present

## 2015-06-16 DIAGNOSIS — Z6841 Body Mass Index (BMI) 40.0 and over, adult: Secondary | ICD-10-CM | POA: Diagnosis not present

## 2015-06-16 DIAGNOSIS — N736 Female pelvic peritoneal adhesions (postinfective): Secondary | ICD-10-CM | POA: Diagnosis not present

## 2015-06-16 DIAGNOSIS — E785 Hyperlipidemia, unspecified: Secondary | ICD-10-CM | POA: Diagnosis not present

## 2015-06-16 DIAGNOSIS — N926 Irregular menstruation, unspecified: Secondary | ICD-10-CM | POA: Diagnosis not present

## 2015-06-16 DIAGNOSIS — Z79899 Other long term (current) drug therapy: Secondary | ICD-10-CM | POA: Diagnosis not present

## 2015-06-16 LAB — CBC
HEMATOCRIT: 37.9 % (ref 36.0–46.0)
Hemoglobin: 12.1 g/dL (ref 12.0–15.0)
MCH: 27.2 pg (ref 26.0–34.0)
MCHC: 31.9 g/dL (ref 30.0–36.0)
MCV: 85.2 fL (ref 78.0–100.0)
Platelets: 413 10*3/uL — ABNORMAL HIGH (ref 150–400)
RBC: 4.45 MIL/uL (ref 3.87–5.11)
RDW: 13.3 % (ref 11.5–15.5)
WBC: 6.9 10*3/uL (ref 4.0–10.5)

## 2015-06-16 LAB — HCG, SERUM, QUALITATIVE: Preg, Serum: NEGATIVE

## 2015-06-16 MED ORDER — PRAVASTATIN SODIUM 40 MG PO TABS: 40.0000 mg | ORAL_TABLET | Freq: Every day | ORAL | Status: DC

## 2015-06-16 NOTE — Anesthesia Preprocedure Evaluation (Addendum)
Anesthesia Evaluation  Patient identified by MRN, date of birth, ID band Patient awake    Reviewed: Allergy & Precautions, H&P , NPO status , Patient's Chart, lab work & pertinent test results  Airway Mallampati: II  TM Distance: >3 FB Neck ROM: full    Dental no notable dental hx. (+) Dental Advisory Given, Teeth Intact   Pulmonary neg pulmonary ROS, former smoker,  breath sounds clear to auscultation  Pulmonary exam normal       Cardiovascular Exercise Tolerance: Good negative cardio ROS Normal cardiovascular examRhythm:regular Rate:Normal     Neuro/Psych negative neurological ROS  negative psych ROS   GI/Hepatic negative GI ROS, Neg liver ROS,   Endo/Other  negative endocrine ROSMorbid obesity  Renal/GU negative Renal ROS  negative genitourinary   Musculoskeletal   Abdominal (+) + obese,   Peds  Hematology negative hematology ROS (+)   Anesthesia Other Findings   Reproductive/Obstetrics negative OB ROS                           Anesthesia Physical Anesthesia Plan  ASA: III  Anesthesia Plan: General   Post-op Pain Management:    Induction: Intravenous  Airway Management Planned: Oral ETT  Additional Equipment:   Intra-op Plan:   Post-operative Plan: Extubation in OR  Informed Consent: I have reviewed the patients History and Physical, chart, labs and discussed the procedure including the risks, benefits and alternatives for the proposed anesthesia with the patient or authorized representative who has indicated his/her understanding and acceptance.   Dental Advisory Given  Plan Discussed with: CRNA and Surgeon  Anesthesia Plan Comments:        Anesthesia Quick Evaluation

## 2015-06-17 ENCOUNTER — Encounter (HOSPITAL_BASED_OUTPATIENT_CLINIC_OR_DEPARTMENT_OTHER)
Admission: RE | Disposition: A | Discharge status: Home / Self Care | Payer: Self-pay | Source: Ambulatory Visit | Attending: Obstetrics and Gynecology

## 2015-06-17 ENCOUNTER — Ambulatory Visit (HOSPITAL_BASED_OUTPATIENT_CLINIC_OR_DEPARTMENT_OTHER): Payer: 59 | Admitting: Anesthesiology

## 2015-06-17 ENCOUNTER — Ambulatory Visit (HOSPITAL_BASED_OUTPATIENT_CLINIC_OR_DEPARTMENT_OTHER)
Admission: RE | Admit: 2015-06-17 | Discharge: 2015-06-17 | Disposition: A | Discharge status: Home / Self Care | Payer: 59 | Source: Ambulatory Visit | Attending: Obstetrics and Gynecology | Admitting: Obstetrics and Gynecology

## 2015-06-17 ENCOUNTER — Encounter (HOSPITAL_BASED_OUTPATIENT_CLINIC_OR_DEPARTMENT_OTHER): Payer: Self-pay | Admitting: *Deleted

## 2015-06-17 DIAGNOSIS — E785 Hyperlipidemia, unspecified: Secondary | ICD-10-CM | POA: Insufficient documentation

## 2015-06-17 DIAGNOSIS — N801 Endometriosis of ovary: Secondary | ICD-10-CM

## 2015-06-17 DIAGNOSIS — N80129 Deep endometriosis of ovary, unspecified ovary: Secondary | ICD-10-CM

## 2015-06-17 DIAGNOSIS — Z87891 Personal history of nicotine dependence: Secondary | ICD-10-CM | POA: Insufficient documentation

## 2015-06-17 DIAGNOSIS — N926 Irregular menstruation, unspecified: Secondary | ICD-10-CM | POA: Insufficient documentation

## 2015-06-17 DIAGNOSIS — Z6841 Body Mass Index (BMI) 40.0 and over, adult: Secondary | ICD-10-CM | POA: Insufficient documentation

## 2015-06-17 DIAGNOSIS — Z79899 Other long term (current) drug therapy: Secondary | ICD-10-CM | POA: Insufficient documentation

## 2015-06-17 DIAGNOSIS — N736 Female pelvic peritoneal adhesions (postinfective): Secondary | ICD-10-CM | POA: Insufficient documentation

## 2015-06-17 HISTORY — DX: Endometriosis of ovary: N80.1

## 2015-06-17 HISTORY — DX: Personal history of other diseases of the female genital tract: Z87.42

## 2015-06-17 HISTORY — DX: Personal history of diseases of the blood and blood-forming organs and certain disorders involving the immune mechanism: Z86.2

## 2015-06-17 HISTORY — PX: LAPAROSCOPIC OVARIAN CYSTECTOMY: SHX6248

## 2015-06-17 HISTORY — DX: Deep endometriosis of ovary, unspecified ovary: N80.129

## 2015-06-17 SURGERY — EXCISION, CYST, OVARY, LAPAROSCOPIC
Anesthesia: General | Site: Abdomen | Laterality: Left

## 2015-06-17 MED ORDER — FENTANYL CITRATE (PF) 100 MCG/2ML IJ SOLN
INTRAMUSCULAR | Status: AC
  Filled 2015-06-17: qty 2

## 2015-06-17 MED ORDER — PROPOFOL 10 MG/ML IV BOLUS
INTRAVENOUS | Status: DC | PRN
  Administered 2015-06-17: 200 mg via INTRAVENOUS

## 2015-06-17 MED ORDER — ONDANSETRON HCL 4 MG/2ML IJ SOLN
INTRAMUSCULAR | Status: DC | PRN
  Administered 2015-06-17: 4 mg via INTRAVENOUS

## 2015-06-17 MED ORDER — DEXAMETHASONE SODIUM PHOSPHATE 4 MG/ML IJ SOLN
INTRAMUSCULAR | Status: DC | PRN
  Administered 2015-06-17: 10 mg via INTRAVENOUS

## 2015-06-17 MED ORDER — FENTANYL CITRATE (PF) 100 MCG/2ML IJ SOLN
25.0000 ug | INTRAMUSCULAR | Status: DC | PRN
  Administered 2015-06-17: 25 ug via INTRAVENOUS
  Filled 2015-06-17: qty 1

## 2015-06-17 MED ORDER — MIDAZOLAM HCL 2 MG/2ML IJ SOLN
INTRAMUSCULAR | Status: AC
  Filled 2015-06-17: qty 2

## 2015-06-17 MED ORDER — LACTATED RINGERS IR SOLN
Status: DC | PRN
  Administered 2015-06-17: 3000 mL

## 2015-06-17 MED ORDER — KETOROLAC TROMETHAMINE 30 MG/ML IJ SOLN
INTRAMUSCULAR | Status: DC | PRN
  Administered 2015-06-17: 30 mg via INTRAVENOUS

## 2015-06-17 MED ORDER — LACTATED RINGERS IV SOLN
INTRAVENOUS | Status: DC
  Administered 2015-06-17 (×2): via INTRAVENOUS
  Filled 2015-06-17: qty 1000

## 2015-06-17 MED ORDER — MIDAZOLAM HCL 5 MG/5ML IJ SOLN
INTRAMUSCULAR | Status: DC | PRN
  Administered 2015-06-17: 2 mg via INTRAVENOUS

## 2015-06-17 MED ORDER — SUCCINYLCHOLINE CHLORIDE 20 MG/ML IJ SOLN
INTRAMUSCULAR | Status: DC | PRN
  Administered 2015-06-17: 100 mg via INTRAVENOUS

## 2015-06-17 MED ORDER — BUPIVACAINE HCL 0.25 % IJ SOLN
INTRAMUSCULAR | Status: DC | PRN
  Administered 2015-06-17: 6 mL

## 2015-06-17 MED ORDER — PROMETHAZINE HCL 25 MG/ML IJ SOLN
12.5000 mg | Freq: Four times a day (QID) | INTRAMUSCULAR | Status: DC | PRN
  Administered 2015-06-17: 6.25 mg via INTRAVENOUS
  Filled 2015-06-17: qty 1

## 2015-06-17 MED ORDER — LIDOCAINE HCL (CARDIAC) 20 MG/ML IV SOLN
INTRAVENOUS | Status: DC | PRN
  Administered 2015-06-17: 60 mg via INTRAVENOUS

## 2015-06-17 MED ORDER — GLYCOPYRROLATE 0.2 MG/ML IJ SOLN
INTRAMUSCULAR | Status: DC | PRN
  Administered 2015-06-17: 0.4 mg via INTRAVENOUS

## 2015-06-17 MED ORDER — PROMETHAZINE HCL 25 MG/ML IJ SOLN
INTRAMUSCULAR | Status: AC
  Filled 2015-06-17: qty 1

## 2015-06-17 MED ORDER — ROCURONIUM BROMIDE 100 MG/10ML IV SOLN
INTRAVENOUS | Status: DC | PRN
  Administered 2015-06-17: 20 mg via INTRAVENOUS
  Administered 2015-06-17 (×3): 5 mg via INTRAVENOUS

## 2015-06-17 MED ORDER — CEFAZOLIN SODIUM-DEXTROSE 2-3 GM-% IV SOLR
2.0000 g | INTRAVENOUS | Status: AC
  Administered 2015-06-17: 2 g via INTRAVENOUS
  Filled 2015-06-17: qty 50

## 2015-06-17 MED ORDER — LACTATED RINGERS IV SOLN
INTRAVENOUS | Status: DC
  Filled 2015-06-17: qty 1000

## 2015-06-17 MED ORDER — OXYCODONE-ACETAMINOPHEN 5-325 MG PO TABS
1.0000 | ORAL_TABLET | Freq: Once | ORAL | Status: AC
  Administered 2015-06-17: 1 via ORAL
  Filled 2015-06-17: qty 1

## 2015-06-17 MED ORDER — CEFAZOLIN SODIUM-DEXTROSE 2-3 GM-% IV SOLR
INTRAVENOUS | Status: AC
  Filled 2015-06-17: qty 50

## 2015-06-17 MED ORDER — NEOSTIGMINE METHYLSULFATE 10 MG/10ML IV SOLN
INTRAVENOUS | Status: DC | PRN
  Administered 2015-06-17: 3 mg via INTRAVENOUS

## 2015-06-17 MED ORDER — OXYCODONE-ACETAMINOPHEN 5-325 MG PO TABS
ORAL_TABLET | ORAL | Status: AC
  Filled 2015-06-17: qty 1

## 2015-06-17 MED ORDER — FENTANYL CITRATE (PF) 100 MCG/2ML IJ SOLN
INTRAMUSCULAR | Status: AC
  Filled 2015-06-17: qty 6

## 2015-06-17 MED ORDER — FENTANYL CITRATE (PF) 100 MCG/2ML IJ SOLN
INTRAMUSCULAR | Status: DC | PRN
  Administered 2015-06-17: 50 ug via INTRAVENOUS
  Administered 2015-06-17: 25 ug via INTRAVENOUS
  Administered 2015-06-17: 50 ug via INTRAVENOUS
  Administered 2015-06-17 (×2): 25 ug via INTRAVENOUS
  Administered 2015-06-17: 50 ug via INTRAVENOUS
  Administered 2015-06-17 (×3): 25 ug via INTRAVENOUS

## 2015-06-17 MED ORDER — OXYCODONE-ACETAMINOPHEN 7.5-325 MG PO TABS: 1.0000 | ORAL_TABLET | ORAL | Status: DC | PRN

## 2015-06-17 SURGICAL SUPPLY — 49 items
BAG URINE DRAINAGE (UROLOGICAL SUPPLIES) ×2 IMPLANT
BLADE SURG 11 STRL SS (BLADE) ×2 IMPLANT
CANISTER SUCTION 2500CC (MISCELLANEOUS) ×2 IMPLANT
CATH FOLEY 2WAY SLVR  5CC 14FR (CATHETERS) ×1
CATH FOLEY 2WAY SLVR 5CC 14FR (CATHETERS) ×1 IMPLANT
CATH ROBINSON RED A/P 16FR (CATHETERS) IMPLANT
COVER MAYO STAND STRL (DRAPES) ×2 IMPLANT
DRSG TEGADERM 2-3/8X2-3/4 SM (GAUZE/BANDAGES/DRESSINGS) ×2 IMPLANT
ELECT REM PT RETURN 9FT ADLT (ELECTROSURGICAL) ×2
ELECTRODE REM PT RTRN 9FT ADLT (ELECTROSURGICAL) ×1 IMPLANT
GAUZE SPONGE 4X4 16PLY XRAY LF (GAUZE/BANDAGES/DRESSINGS) ×2 IMPLANT
GLOVE BIO SURGEON STRL SZ7 (GLOVE) ×4 IMPLANT
GOWN STRL REUS W/TWL XL LVL3 (GOWN DISPOSABLE) ×6 IMPLANT
IV LACTATED RINGER IRRG 3000ML (IV SOLUTION) ×1
IV LR IRRIG 3000ML ARTHROMATIC (IV SOLUTION) ×1 IMPLANT
LIQUID BAND (GAUZE/BANDAGES/DRESSINGS) ×2 IMPLANT
NEEDLE INSUFFLATION 120MM (ENDOMECHANICALS) ×2 IMPLANT
NEEDLE INSUFFLATION 14GA 120MM (NEEDLE) ×2 IMPLANT
NS IRRIG 500ML POUR BTL (IV SOLUTION) ×2 IMPLANT
PACK BASIN DAY SURGERY FS (CUSTOM PROCEDURE TRAY) ×2 IMPLANT
PACK LAPAROSCOPY II (CUSTOM PROCEDURE TRAY) ×2 IMPLANT
PAD OB MATERNITY 4.3X12.25 (PERSONAL CARE ITEMS) ×2 IMPLANT
PAD PREP 24X48 CUFFED NSTRL (MISCELLANEOUS) ×2 IMPLANT
PADDING ION DISPOSABLE (MISCELLANEOUS) ×2 IMPLANT
PENCIL BUTTON HOLSTER BLD 10FT (ELECTRODE) ×2 IMPLANT
POUCH SPECIMEN RETRIEVAL 10MM (ENDOMECHANICALS) IMPLANT
SCISSORS LAP 5X35 DISP (ENDOMECHANICALS) IMPLANT
SCISSORS LAP 5X45 EPIX DISP (ENDOMECHANICALS) ×2 IMPLANT
SEALER TISSUE G2 CVD JAW 35 (ENDOMECHANICALS) IMPLANT
SEALER TISSUE G2 CVD JAW 45CM (ENDOMECHANICALS) IMPLANT
SET IRRIG TUBING LAPAROSCOPIC (IRRIGATION / IRRIGATOR) ×2 IMPLANT
SLEEVE ENDOPATH XCEL 5M (ENDOMECHANICALS) ×2 IMPLANT
SOLUTION ANTI FOG 6CC (MISCELLANEOUS) ×4 IMPLANT
SPONGE GAUZE 2X2 8PLY STRL LF (GAUZE/BANDAGES/DRESSINGS) ×2 IMPLANT
SUT VIC AB 2-0 CT2 27 (SUTURE) ×2 IMPLANT
SUT VIC AB 3-0 PS2 18 (SUTURE) ×2
SUT VIC AB 3-0 PS2 18XBRD (SUTURE) ×2 IMPLANT
SUT VICRYL 0 UR6 27IN ABS (SUTURE) ×6 IMPLANT
SUT VICRYL 4-0 PS2 18IN ABS (SUTURE) IMPLANT
SYRINGE 10CC LL (SYRINGE) ×2 IMPLANT
TOWEL OR 17X24 6PK STRL BLUE (TOWEL DISPOSABLE) ×2 IMPLANT
TRAY DSU PREP LF (CUSTOM PROCEDURE TRAY) ×2 IMPLANT
TROCAR 12M 150ML BLUNT (TROCAR) ×4 IMPLANT
TROCAR BALLN 12MMX100 BLUNT (TROCAR) ×2 IMPLANT
TROCAR OPTI TIP 5M 100M (ENDOMECHANICALS) ×2 IMPLANT
TROCAR XCEL BLUNT TIP 100MML (ENDOMECHANICALS) ×2 IMPLANT
TUBING INSUFFLATION 10FT LAP (TUBING) ×2 IMPLANT
WARMER LAPAROSCOPE (MISCELLANEOUS) ×2 IMPLANT
WATER STERILE IRR 500ML POUR (IV SOLUTION) ×2 IMPLANT

## 2015-06-17 NOTE — Anesthesia Postprocedure Evaluation (Signed)
  Anesthesia Post-op Note  Patient: Cynthia [name]  Procedure(s) Performed: Procedure(s) (LRB): LAPAROSCOPIC OVARIAN CYSTECTOMY, LYSIS OF ADHESIONS (Left)  Patient Location: PACU  Anesthesia Type: General  Level of Consciousness: awake and alert   Airway and Oxygen Therapy: Patient Spontanous Breathing  Post-op Pain: mild  Post-op Assessment: Post-op Vital signs reviewed, Patient's Cardiovascular Status Stable, Respiratory Function Stable, Patent Airway and No signs of Nausea or vomiting  Last Vitals:  Filed Vitals:   06/17/15 1045  BP: 107/70  Pulse: 60  Temp:   Resp: 12    Post-op Vital Signs: stable   Complications: No apparent anesthesia complications

## 2015-06-17 NOTE — Discharge Instructions (Signed)

## 2015-06-17 NOTE — Anesthesia Procedure Notes (Signed)
Procedure Name: Intubation Date/Time: 06/17/2015 7:36 AM Performed by: Denna Haggard D Pre-anesthesia Checklist: Patient identified, Emergency Drugs available, Suction available and Patient being monitored Patient Re-evaluated:Patient Re-evaluated prior to inductionOxygen Delivery Method: Circle System Utilized Preoxygenation: Pre-oxygenation with 100% oxygen Intubation Type: IV induction Ventilation: Mask ventilation without difficulty Laryngoscope Size: Mac and 3 Grade View: Grade I Tube type: Oral Tube size: 7.0 mm Number of attempts: 1 Airway Equipment and Method: Stylet and Oral airway Placement Confirmation: ETT inserted through vocal cords under direct vision,  positive ETCO2 and breath sounds checked- equal and bilateral Secured at: 21 cm Tube secured with: Tape Dental Injury: Teeth and Oropharynx as per pre-operative assessment

## 2015-06-17 NOTE — Brief Op Note (Signed)
06/17/2015  9:51 AM  PATIENT:  Cynthia [name]  34 y.o. female  PRE-OPERATIVE DIAGNOSIS:  endometrioma  POST-OPERATIVE DIAGNOSIS:  endometrioma  PROCEDURE:  Procedure(s): LAPAROSCOPIC OVARIAN CYSTECTOMY, LYSIS OF ADHESIONS (Left)  SURGEON:  Surgeon(s) and Role:    * Arvella Nigh, MD - Primary  PHYSICIAN ASSISTANT:   ASSISTANTS: none   ANESTHESIA:   general  EBL:  Total I/O In: 200 [I.V.:200] Out: 1200 [Urine:1100; Blood:100]  BLOOD ADMINISTERED:none  DRAINS: none   LOCAL MEDICATIONS USED:  MARCAINE     SPECIMEN:  No Specimen  DISPOSITION OF SPECIMEN:  N/A  COUNTS:  YES  TOURNIQUET:  * No tourniquets in log *  DICTATION: .Other Dictation: Dictation Number [id number]  PLAN OF CARE: Discharge to home after PACU  PATIENT DISPOSITION:  PACU - hemodynamically stable.   Delay start of Pharmacological VTE agent (>24hrs) due to surgical blood loss or risk of bleeding: not applicable

## 2015-06-17 NOTE — H&P (Signed)
  History and physical exam unchanged 

## 2015-06-17 NOTE — Transfer of Care (Signed)
Immediate Anesthesia Transfer of Care Note  Patient: Cynthia [name]  Procedure(s) Performed: Procedure(s) (LRB): LAPAROSCOPIC OVARIAN CYSTECTOMY, LYSIS OF ADHESIONS (Left)  Patient Location: PACU  Anesthesia Type: General  Level of Consciousness: awake, oriented, sedated and patient cooperative  Airway & Oxygen Therapy: Patient Spontanous Breathing and Patient connected to face mask oxygen  Post-op Assessment: Report given to PACU RN and Post -op Vital signs reviewed and stable  Post vital signs: Reviewed and stable  Complications: No apparent anesthesia complications

## 2015-06-18 NOTE — Op Note (Signed)
NAMEELINE, GENG NO.:  [id number]  MEDICAL RECORD NO.:  [id number]  LOCATION:                               FACILITY:  [facility]  PHYSICIAN:  Darlyn Chamber, M.D.   DATE OF BIRTH:  1981-10-09  DATE OF PROCEDURE:  06/17/2015 DATE OF DISCHARGE:  06/17/2015                              OPERATIVE REPORT   PREOPERATIVE DIAGNOSIS:  Probable endometrioma of the left ovary.  POSTOPERATIVE DIAGNOSIS:  Extensive pelvic adhesions with left ovarian endometrioma.  PROCEDURE:  Open laparoscopy, lysis of adhesions, left ovarian cystotomy with drainage of endometrioma and cautery of the line of the endometrioma.  SURGEON:  Darlyn Chamber, M.D.  ANESTHESIA:  General endotracheal.  ESTIMATED BLOOD LOSS:  Minimal.  PACKS:  None.  DRAINS:  None.  INTRAOPERATIVE BLOOD PLACEMENT:  None.  COMPLICATIONS:  None.  INDICATION:  Dictated in history and physical.  DESCRIPTION OF PROCEDURE:  The patient was taken to the OR and placed in supine position.  After satisfactory level of general endotracheal anesthesia was obtained, the patient was placed in dorsal lithotomy position using the Allen stirrups.  The abdomen, perineum, and vagina were prepped out with Betadine.  Bladder was emptied by in-and-out catheterization.  The Hulka tenaculum was put in place and secured.  The patient was draped in sterile field.  Subumbilical incision was made with the knife and extended through subcutaneous tissue.  The anterior rectus fascia was identified and entered sharply.  We had some arterial bleeding at this time, which I felt came from the muscle.  We got this suctioning out as well as with the Bovie.  Using the Bovie, we eventually brought the bleeding under control.  We separated the muscles.  We had difficulty getting to the peritoneum due to the thickness of the abdominal wall.  At this point in time, we elevated the fascia with a Kocher.  We used the Veress needle, introduced  it through the peritoneum, inflated the peritoneum with CO2, and then using the [place], we were able to enter the peritoneum.  We tried to use the open laparoscopic trocar, but it was too short, did not have a long one; therefore, we adapted an open laparoscopic trocar seal to a long plane trocar.  It was introduced and we secured it to the skin with sutures. We then inflated the abdomen.  Laparoscope was then introduced.  We were in the abdominal cavity.  No evidence of injury to adjacent organs.  The patient was placed in the Trendelenburg position.  A 5 mm trocar was put in place in the suprapubic area under direct visualization.  She had adhesions between the back of the uterus and the bowel and from the right ovary to the uterus.  Left ovary was densely adherent to the left pelvic sidewall.  Both tubes were actually normal and basically free. We brought in the monopolar scissors.  We took down all the adhesions in the cul-de-sac completely clearing it up.  The right ovary was completely free and tube.  We then went to the left side.  We took down the adhesions between the left tube and the sigmoid colon, thus freeing it up.  We were able to partially free up the left ovary from its adhesions.  At this point in time, we entered the endometrioma, it was drained.  We could identify the inside of the endometrioma and the lining was cauterized using the bipolar.  With this, we had adequately cauterized the endometrioma.  We thoroughly irrigated the pelvis.  No active bleeding was noted.  We did put in a third trocar in the left lower quadrant.  This was used to help manipulate.  At the end of the procedure, the left endometrium had been drained and cauterized.  All the adhesions had been taken down.  I could not completely free the left ovary from the pelvic sidewall, but the left tube was completely free and normal.  Right tube and ovary were free and normal.  Again, we thoroughly   irrigated the pelvis.  We had good hemostasis.  The abdomen was completely deflated with carbon dioxide.  All trocars removed. Subumbilical fascia closed with two figure-of-eights of 0 Vicryl.  Skin was closed with interrupted subcuticulars of 4-0 Vicryl.  The suprapubic incision was closed with Dermabond.  The Foley had been placed in the middle of the case to deflate the bladder.  This was removed at this time along with the Hulka tenaculum.  The patient was taken out of the dorsal lithotomy position.  Once alert and extubated, transferred to recovery room in good condition.  Sponge, instrument, and needle count was correct by circulating nurse.     Darlyn Chamber, M.D.     JSM/MEDQ  D:  06/17/2015  T:  06/18/2015  Job:  [id number]

## 2015-06-20 ENCOUNTER — Encounter (HOSPITAL_BASED_OUTPATIENT_CLINIC_OR_DEPARTMENT_OTHER): Payer: Self-pay | Admitting: Obstetrics and Gynecology

## 2015-10-18 ENCOUNTER — Telehealth: Payer: Self-pay | Admitting: Family Medicine

## 2015-10-18 NOTE — Telephone Encounter (Signed)
Caller name: Self   Can be reached:[phone] (H)   Reason for call: Has CPE scheduled for 10/20/2015 and needs to reschedule. Wants to reschedule with either Einar Pheasant or Percell Miller if [place] with Dr. Birdie Riddle

## 2015-10-19 NOTE — Telephone Encounter (Signed)
Ok to schedule with either of them if they are ok with it

## 2015-10-19 NOTE — Telephone Encounter (Signed)
Patient of Dr. Birdie Riddle had CPE scheduled for 11/10 but needed to change appt and wants to know if its ok with either Einar Pheasant or Percell Miller if she schedules her CPE with one of them. OK with Dr. Birdie Riddle

## 2015-10-19 NOTE — Telephone Encounter (Signed)
I am happy to see her

## 2015-10-19 NOTE — Telephone Encounter (Signed)
Left message on voicemail for patient to call the office and schedule appointment with [facility] for her CPE

## 2015-10-20 ENCOUNTER — Encounter: Payer: 59 | Admitting: Family Medicine

## 2015-12-14 MED FILL — CLOMIPHENE CITRATE 50 MG TA: 50 | 5 days supply | Qty: 10 | Fill #0

## 2015-12-21 DIAGNOSIS — N801 Endometriosis of ovary: Secondary | ICD-10-CM | POA: Diagnosis not present

## 2015-12-21 DIAGNOSIS — Z31430 Encounter of female for testing for genetic disease carrier status for procreative management: Secondary | ICD-10-CM | POA: Diagnosis not present

## 2015-12-21 DIAGNOSIS — Z319 Encounter for procreative management, unspecified: Secondary | ICD-10-CM | POA: Diagnosis not present

## 2015-12-21 DIAGNOSIS — N840 Polyp of corpus uteri: Secondary | ICD-10-CM | POA: Diagnosis not present

## 2015-12-21 DIAGNOSIS — E282 Polycystic ovarian syndrome: Secondary | ICD-10-CM | POA: Diagnosis not present

## 2015-12-21 DIAGNOSIS — Z3161 Procreative counseling and advice using natural family planning: Secondary | ICD-10-CM | POA: Diagnosis not present

## 2015-12-21 DIAGNOSIS — Z13228 Encounter for screening for other metabolic disorders: Secondary | ICD-10-CM | POA: Diagnosis not present

## 2015-12-21 DIAGNOSIS — Z130 Encounter for screening for diseases of the blood and blood-forming organs and certain disorders involving the immune mechanism: Secondary | ICD-10-CM | POA: Diagnosis not present

## 2015-12-23 DIAGNOSIS — Z319 Encounter for procreative management, unspecified: Secondary | ICD-10-CM | POA: Diagnosis not present

## 2015-12-28 ENCOUNTER — Ambulatory Visit (INDEPENDENT_AMBULATORY_CARE_PROVIDER_SITE_OTHER): Payer: 59 | Admitting: Physician Assistant

## 2015-12-28 ENCOUNTER — Encounter: Payer: Self-pay | Admitting: Physician Assistant

## 2015-12-28 VITALS — BP 111/53 | HR 80 | Temp 97.9°F | Ht 62.0 in | Wt 218.8 lb

## 2015-12-28 DIAGNOSIS — Z0000 Encounter for general adult medical examination without abnormal findings: Secondary | ICD-10-CM

## 2015-12-28 DIAGNOSIS — E785 Hyperlipidemia, unspecified: Secondary | ICD-10-CM

## 2015-12-28 LAB — URINALYSIS, ROUTINE W REFLEX MICROSCOPIC
Bilirubin Urine: NEGATIVE
Ketones, ur: NEGATIVE
Leukocytes, UA: NEGATIVE
NITRITE: NEGATIVE
RBC / HPF: NONE SEEN (ref 0–?)
Specific Gravity, Urine: 1.025 (ref 1.000–1.030)
Total Protein, Urine: NEGATIVE
URINE GLUCOSE: NEGATIVE
Urobilinogen, UA: 0.2 (ref 0.0–1.0)
WBC UA: NONE SEEN (ref 0–?)
pH: 6 (ref 5.0–8.0)

## 2015-12-28 LAB — COMPREHENSIVE METABOLIC PANEL
ALT: 9 U/L (ref 0–35)
AST: 12 U/L (ref 0–37)
Albumin: 3.8 g/dL (ref 3.5–5.2)
Alkaline Phosphatase: 55 U/L (ref 39–117)
BUN: 12 mg/dL (ref 6–23)
CO2: 26 mEq/L (ref 19–32)
Calcium: 9 mg/dL (ref 8.4–10.5)
Chloride: 105 mEq/L (ref 96–112)
Creatinine, Ser: 0.7 mg/dL (ref 0.40–1.20)
GFR: 101.04 mL/min (ref >60.00)
GLUCOSE: 87 mg/dL (ref 70–99)
Potassium: 4.2 mEq/L (ref 3.5–5.1)
Sodium: 139 mEq/L (ref 135–145)
Total Bilirubin: 0.3 mg/dL (ref 0.2–1.2)
Total Protein: 7 g/dL (ref 6.0–8.3)

## 2015-12-28 LAB — HEMOGLOBIN A1C: Hgb A1c MFr Bld: 5.9 % (ref 4.6–6.5)

## 2015-12-28 LAB — CBC
HCT: 33.4 % — ABNORMAL LOW (ref 36.0–46.0)
Hemoglobin: 10.8 g/dL — ABNORMAL LOW (ref 12.0–15.0)
MCHC: 32.2 g/dL (ref 30.0–36.0)
MCV: 78.9 fl (ref 78.0–100.0)
Platelets: 441 10*3/uL — ABNORMAL HIGH (ref 150.0–400.0)
RBC: 4.24 Mil/uL (ref 3.87–5.11)
RDW: 14.1 % (ref 11.5–15.5)
WBC: 7.5 10*3/uL (ref 4.0–10.5)

## 2015-12-28 LAB — LDL CHOLESTEROL, DIRECT: Direct LDL: 110 mg/dL

## 2015-12-28 LAB — LIPID PANEL
CHOLESTEROL: 188 mg/dL (ref 0–200)
HDL: 37.5 mg/dL — AB (ref >39.00)
NonHDL: 150.51
TRIGLYCERIDES: 204 mg/dL — AB (ref 0.0–149.0)
Total CHOL/HDL Ratio: 5
VLDL: 40.8 mg/dL — ABNORMAL HIGH (ref 0.0–40.0)

## 2015-12-28 LAB — TSH: TSH: 2.89 u[IU]/mL (ref 0.35–4.50)

## 2015-12-28 MED FILL — METFORMIN HCL ER 500 MG TAB: 500 | 28 days supply | Qty: 50 | Fill #3

## 2015-12-28 NOTE — Assessment & Plan Note (Signed)
Depression screen negative. Health Maintenance reviewed -- Followed by GYN for PAP which is up-to-date. Will be having first mammogram this year. Up-to-date on immunizations. Preventive schedule discussed and handout given in AVS. Will obtain fasting labs today.

## 2015-12-28 NOTE — Patient Instructions (Signed)
Please go to the lab for blood work.  I will call you with your results. If your blood work is normal we will follow-up yearly for physicals.  We will treat any abnormal findings while being respectful of the fact that you are trying to conceive.  It was a pleasure to meet you.  Preventive Care for Adults, Female A healthy lifestyle and preventive care can promote health and wellness. Preventive health guidelines for women include the following key practices.  A routine yearly physical is a good way to check with your health care provider about your health and preventive screening. It is a chance to share any concerns and updates on your health and to receive a thorough exam.  Visit your dentist for a routine exam and preventive care every 6 months. Brush your teeth twice a day and floss once a day. Good oral hygiene prevents tooth decay and gum disease.  The frequency of eye exams is based on your age, health, family medical history, use of contact lenses, and other factors. Follow your health care provider's recommendations for frequency of eye exams.  Eat a healthy diet. Foods like vegetables, fruits, whole grains, low-fat dairy products, and lean protein foods contain the nutrients you need without too many calories. Decrease your intake of foods high in solid fats, added sugars, and salt. Eat the right amount of calories for you.Get information about a proper diet from your health care provider, if necessary.  Regular physical exercise is one of the most important things you can do for your health. Most adults should get at least 150 minutes of moderate-intensity exercise (any activity that increases your heart rate and causes you to sweat) each week. In addition, most adults need muscle-strengthening exercises on 2 or more days a week.  Maintain a healthy weight. The body mass index (BMI) is a screening tool to identify possible weight problems. It provides an estimate of body fat based on  height and weight. Your health care provider can find your BMI and can help you achieve or maintain a healthy weight.For adults 20 years and older:  A BMI below 18.5 is considered underweight.  A BMI of 18.5 to 24.9 is normal.  A BMI of 25 to 29.9 is considered overweight.  A BMI of 30 and above is considered obese.  Maintain normal blood lipids and cholesterol levels by exercising and minimizing your intake of saturated fat. Eat a balanced diet with plenty of fruit and vegetables. Blood tests for lipids and cholesterol should begin at age 22 and be repeated every 5 years. If your lipid or cholesterol levels are high, you are over 50, or you are at high risk for heart disease, you may need your cholesterol levels checked more frequently.Ongoing high lipid and cholesterol levels should be treated with medicines if diet and exercise are not working.  If you smoke, find out from your health care provider how to quit. If you do not use tobacco, do not start.  Lung cancer screening is recommended for adults aged 7-80 years who are at high risk for developing lung cancer because of a history of smoking. A yearly low-dose CT scan of the lungs is recommended for people who have at least a 30-pack-year history of smoking and are a current smoker or have quit within the past 15 years. A pack year of smoking is smoking an average of 1 pack of cigarettes a day for 1 year (for example: 1 pack a day for 30 years  or 2 packs a day for 15 years). Yearly screening should continue until the smoker has stopped smoking for at least 15 years. Yearly screening should be stopped for people who develop a health problem that would prevent them from having lung cancer treatment.  If you are pregnant, do not drink alcohol. If you are breastfeeding, be very cautious about drinking alcohol. If you are not pregnant and choose to drink alcohol, do not have more than 1 drink per day. One drink is considered to be 12 ounces (355  mL) of beer, 5 ounces (148 mL) of wine, or 1.5 ounces (44 mL) of liquor.  Avoid use of street drugs. Do not share needles with anyone. Ask for help if you need support or instructions about stopping the use of drugs.  High blood pressure causes heart disease and increases the risk of stroke. Your blood pressure should be checked at least every 1 to 2 years. Ongoing high blood pressure should be treated with medicines if weight loss and exercise do not work.  If you are 74-67 years old, ask your health care provider if you should take aspirin to prevent strokes.  Diabetes screening is done by taking a blood sample to check your blood glucose level after you have not eaten for a certain period of time (fasting). If you are not overweight and you do not have risk factors for diabetes, you should be screened once every 3 years starting at age 23. If you are overweight or obese and you are 90-70 years of age, you should be screened for diabetes every year as part of your cardiovascular risk assessment.  Breast cancer screening is essential preventive care for women. You should practice "breast self-awareness." This means understanding the normal appearance and feel of your breasts and may include breast self-examination. Any changes detected, no matter how small, should be reported to a health care provider. Women in their 76s and 30s should have a clinical breast exam (CBE) by a health care provider as part of a regular health exam every 1 to 3 years. After age 69, women should have a CBE every year. Starting at age 56, women should consider having a mammogram (breast X-ray test) every year. Women who have a family history of breast cancer should talk to their health care provider about genetic screening. Women at a high risk of breast cancer should talk to their health care providers about having an MRI and a mammogram every year.  Breast cancer gene (BRCA)-related cancer risk assessment is recommended for  women who have family members with BRCA-related cancers. BRCA-related cancers include breast, ovarian, tubal, and peritoneal cancers. Having family members with these cancers may be associated with an increased risk for harmful changes (mutations) in the breast cancer genes BRCA1 and BRCA2. Results of the assessment will determine the need for genetic counseling and BRCA1 and BRCA2 testing.  Your health care provider may recommend that you be screened regularly for cancer of the pelvic organs (ovaries, uterus, and vagina). This screening involves a pelvic examination, including checking for microscopic changes to the surface of your cervix (Pap test). You may be encouraged to have this screening done every 3 years, beginning at age 85.  For women ages 67-65, health care providers may recommend pelvic exams and Pap testing every 3 years, or they may recommend the Pap and pelvic exam, combined with testing for human papilloma virus (HPV), every 5 years. Some types of HPV increase your risk of cervical cancer. Testing  for HPV may also be done on women of any age with unclear Pap test results.  Other health care providers may not recommend any screening for nonpregnant women who are considered low risk for pelvic cancer and who do not have symptoms. Ask your health care provider if a screening pelvic exam is right for you.  If you have had past treatment for cervical cancer or a condition that could lead to cancer, you need Pap tests and screening for cancer for at least 20 years after your treatment. If Pap tests have been discontinued, your risk factors (such as having a new sexual partner) need to be reassessed to determine if screening should resume. Some women have medical problems that increase the chance of getting cervical cancer. In these cases, your health care provider may recommend more frequent screening and Pap tests.  Colorectal cancer can be detected and often prevented. Most routine colorectal  cancer screening begins at the age of 24 years and continues through age 68 years. However, your health care provider may recommend screening at an earlier age if you have risk factors for colon cancer. On a yearly basis, your health care provider may provide home test kits to check for hidden blood in the stool. Use of a small camera at the end of a tube, to directly examine the colon (sigmoidoscopy or colonoscopy), can detect the earliest forms of colorectal cancer. Talk to your health care provider about this at age 52, when routine screening begins. Direct exam of the colon should be repeated every 5-10 years through age 42 years, unless early forms of precancerous polyps or small growths are found.  People who are at an increased risk for hepatitis B should be screened for this virus. You are considered at high risk for hepatitis B if:  You were born in a country where hepatitis B occurs often. Talk with your health care provider about which countries are considered high risk.  Your parents were born in a high-risk country and you have not received a shot to protect against hepatitis B (hepatitis B vaccine).  You have HIV or AIDS.  You use needles to inject street drugs.  You live with, or have sex with, someone who has hepatitis B.  You get hemodialysis treatment.  You take certain medicines for conditions like cancer, organ transplantation, and autoimmune conditions.  Hepatitis C blood testing is recommended for all people born from [birth year] through 1965 and any individual with known risks for hepatitis C.  Practice safe sex. Use condoms and avoid high-risk sexual practices to reduce the spread of sexually transmitted infections (STIs). STIs include gonorrhea, chlamydia, syphilis, trichomonas, herpes, HPV, and human immunodeficiency virus (HIV). Herpes, HIV, and HPV are viral illnesses that have no cure. They can result in disability, cancer, and death.  You should be screened for sexually  transmitted illnesses (STIs) including gonorrhea and chlamydia if:  You are sexually active and are younger than 24 years.  You are older than 24 years and your health care provider tells you that you are at risk for this type of infection.  Your sexual activity has changed since you were last screened and you are at an increased risk for chlamydia or gonorrhea. Ask your health care provider if you are at risk.  If you are at risk of being infected with HIV, it is recommended that you take a prescription medicine daily to prevent HIV infection. This is called preexposure prophylaxis (PrEP). You are considered at risk if:  You are sexually active and do not regularly use condoms or know the HIV status of your partner(s).  You take drugs by injection.  You are sexually active with a partner who has HIV.  Talk with your health care provider about whether you are at high risk of being infected with HIV. If you choose to begin PrEP, you should first be tested for HIV. You should then be tested every 3 months for as long as you are taking PrEP.  Osteoporosis is a disease in which the bones lose minerals and strength with aging. This can result in serious bone fractures or breaks. The risk of osteoporosis can be identified using a bone density scan. Women ages 64 years and over and women at risk for fractures or osteoporosis should discuss screening with their health care providers. Ask your health care provider whether you should take a calcium supplement or vitamin D to reduce the rate of osteoporosis.  Menopause can be associated with physical symptoms and risks. Hormone replacement therapy is available to decrease symptoms and risks. You should talk to your health care provider about whether hormone replacement therapy is right for you.  Use sunscreen. Apply sunscreen liberally and repeatedly throughout the day. You should seek shade when your shadow is shorter than you. Protect yourself by  wearing long sleeves, pants, a wide-brimmed hat, and sunglasses year round, whenever you are outdoors.  Once a month, do a whole body skin exam, using a mirror to look at the skin on your back. Tell your health care provider of new moles, moles that have irregular borders, moles that are larger than a pencil eraser, or moles that have changed in shape or color.  Stay current with required vaccines (immunizations).  Influenza vaccine. All adults should be immunized every year.  Tetanus, diphtheria, and acellular pertussis (Td, Tdap) vaccine. Pregnant women should receive 1 dose of Tdap vaccine during each pregnancy. The dose should be obtained regardless of the length of time since the last dose. Immunization is preferred during the 27th-36th week of gestation. An adult who has not previously received Tdap or who does not know her vaccine status should receive 1 dose of Tdap. This initial dose should be followed by tetanus and diphtheria toxoids (Td) booster doses every 10 years. Adults with an unknown or incomplete history of completing a 3-dose immunization series with Td-containing vaccines should begin or complete a primary immunization series including a Tdap dose. Adults should receive a Td booster every 10 years.  Varicella vaccine. An adult without evidence of immunity to varicella should receive 2 doses or a second dose if she has previously received 1 dose. Pregnant females who do not have evidence of immunity should receive the first dose after pregnancy. This first dose should be obtained before leaving the health care facility. The second dose should be obtained 4-8 weeks after the first dose.  Human papillomavirus (HPV) vaccine. Females aged 13-26 years who have not received the vaccine previously should obtain the 3-dose series. The vaccine is not recommended for use in pregnant females. However, pregnancy testing is not needed before receiving a dose. If a female is found to be pregnant  after receiving a dose, no treatment is needed. In that case, the remaining doses should be delayed until after the pregnancy. Immunization is recommended for any person with an immunocompromised condition through the age of 48 years if she did not get any or all doses earlier. During the 3-dose series, the second dose should  be obtained 4-8 weeks after the first dose. The third dose should be obtained 24 weeks after the first dose and 16 weeks after the second dose.  Zoster vaccine. One dose is recommended for adults aged 18 years or older unless certain conditions are present.  Measles, mumps, and rubella (MMR) vaccine. Adults born before [birth year] generally are considered immune to measles and mumps. Adults born in [birth year] or later should have 1 or more doses of MMR vaccine unless there is a contraindication to the vaccine or there is laboratory evidence of immunity to each of the three diseases. A routine second dose of MMR vaccine should be obtained at least 28 days after the first dose for students attending postsecondary schools, health care workers, or international travelers. People who received inactivated measles vaccine or an unknown type of measles vaccine during 1963-1967 should receive 2 doses of MMR vaccine. People who received inactivated mumps vaccine or an unknown type of mumps vaccine before 1979 and are at high risk for mumps infection should consider immunization with 2 doses of MMR vaccine. For females of childbearing age, rubella immunity should be determined. If there is no evidence of immunity, females who are not pregnant should be vaccinated. If there is no evidence of immunity, females who are pregnant should delay immunization until after pregnancy. Unvaccinated health care workers born before [birth year] who lack laboratory evidence of measles, mumps, or rubella immunity or laboratory confirmation of disease should consider measles and mumps immunization with 2 doses of MMR vaccine or rubella  immunization with 1 dose of MMR vaccine.  Pneumococcal 13-valent conjugate (PCV13) vaccine. When indicated, a person who is uncertain of his immunization history and has no record of immunization should receive the PCV13 vaccine. All adults 77 years of age and older should receive this vaccine. An adult aged 56 years or older who has certain medical conditions and has not been previously immunized should receive 1 dose of PCV13 vaccine. This PCV13 should be followed with a dose of pneumococcal polysaccharide (PPSV23) vaccine. Adults who are at high risk for pneumococcal disease should obtain the PPSV23 vaccine at least 8 weeks after the dose of PCV13 vaccine. Adults older than 36 years of age who have normal immune system function should obtain the PPSV23 vaccine dose at least 1 year after the dose of PCV13 vaccine.  Pneumococcal polysaccharide (PPSV23) vaccine. When PCV13 is also indicated, PCV13 should be obtained first. All adults aged 44 years and older should be immunized. An adult younger than age 26 years who has certain medical conditions should be immunized. Any person who resides in a nursing home or long-term care facility should be immunized. An adult smoker should be immunized. People with an immunocompromised condition and certain other conditions should receive both PCV13 and PPSV23 vaccines. People with human immunodeficiency virus (HIV) infection should be immunized as soon as possible after diagnosis. Immunization during chemotherapy or radiation therapy should be avoided. Routine use of PPSV23 vaccine is not recommended for American Indians, [place] Natives, or people younger than 65 years unless there are medical conditions that require PPSV23 vaccine. When indicated, people who have unknown immunization and have no record of immunization should receive PPSV23 vaccine. One-time revaccination 5 years after the first dose of PPSV23 is recommended for people aged 19-64 years who have chronic  kidney failure, nephrotic syndrome, asplenia, or immunocompromised conditions. People who received 1-2 doses of PPSV23 before age 90 years should receive another dose of PPSV23 vaccine at age 18 years or  later if at least 5 years have passed since the previous dose. Doses of PPSV23 are not needed for people immunized with PPSV23 at or after age 80 years.  Meningococcal vaccine. Adults with asplenia or persistent complement component deficiencies should receive 2 doses of quadrivalent meningococcal conjugate (MenACWY-D) vaccine. The doses should be obtained at least 2 months apart. Microbiologists working with certain meningococcal bacteria, [place] recruits, people at risk during an outbreak, and people who travel to or live in countries with a high rate of meningitis should be immunized. A first-year college student up through age 55 years who is living in a residence hall should receive a dose if she did not receive a dose on or after her 16th birthday. Adults who have certain high-risk conditions should receive one or more doses of vaccine.  Hepatitis A vaccine. Adults who wish to be protected from this disease, have certain high-risk conditions, work with hepatitis A-infected animals, work in hepatitis A research labs, or travel to or work in countries with a high rate of hepatitis A should be immunized. Adults who were previously unvaccinated and who anticipate close contact with an international adoptee during the first 60 days after arrival in the [place] from a country with a high rate of hepatitis A should be immunized.  Hepatitis B vaccine. Adults who wish to be protected from this disease, have certain high-risk conditions, may be exposed to blood or other infectious body fluids, are household contacts or sex partners of hepatitis B positive people, are clients or workers in certain care facilities, or travel to or work in countries with a high rate of hepatitis B should be  immunized.  Haemophilus influenzae type b (Hib) vaccine. A previously unvaccinated person with asplenia or sickle cell disease or having a scheduled splenectomy should receive 1 dose of Hib vaccine. Regardless of previous immunization, a recipient of a hematopoietic stem cell transplant should receive a 3-dose series 6-12 months after her successful transplant. Hib vaccine is not recommended for adults with HIV infection. Preventive Services / Frequency Ages 44 to 34 years  Blood pressure check.** / Every 3-5 years.  Lipid and cholesterol check.** / Every 5 years beginning at age 68.  Clinical breast exam.** / Every 3 years for women in their 2s and 38s.  BRCA-related cancer risk assessment.** / For women who have family members with a BRCA-related cancer (breast, ovarian, tubal, or peritoneal cancers).  Pap test.** / Every 2 years from ages 70 through 13. Every 3 years starting at age 37 through age 39 or 53 with a history of 3 consecutive normal Pap tests.  HPV screening.** / Every 3 years from ages 25 through ages 73 to 66 with a history of 3 consecutive normal Pap tests.  Hepatitis C blood test.** / For any individual with known risks for hepatitis C.  Skin self-exam. / Monthly.  Influenza vaccine. / Every year.  Tetanus, diphtheria, and acellular pertussis (Tdap, Td) vaccine.** / Consult your health care provider. Pregnant women should receive 1 dose of Tdap vaccine during each pregnancy. 1 dose of Td every 10 years.  Varicella vaccine.** / Consult your health care provider. Pregnant females who do not have evidence of immunity should receive the first dose after pregnancy.  HPV vaccine. / 3 doses over 6 months, if 51 and younger. The vaccine is not recommended for use in pregnant females. However, pregnancy testing is not needed before receiving a dose.  Measles, mumps, rubella (MMR) vaccine.** / You need at  least 1 dose of MMR if you were born in 1957 or later. You may also need  a 2nd dose. For females of childbearing age, rubella immunity should be determined. If there is no evidence of immunity, females who are not pregnant should be vaccinated. If there is no evidence of immunity, females who are pregnant should delay immunization until after pregnancy.  Pneumococcal 13-valent conjugate (PCV13) vaccine.** / Consult your health care provider.  Pneumococcal polysaccharide (PPSV23) vaccine.** / 1 to 2 doses if you smoke cigarettes or if you have certain conditions.  Meningococcal vaccine.** / 1 dose if you are age 95 to 16 years and a Market researcher living in a residence hall, or have one of several medical conditions, you need to get vaccinated against meningococcal disease. You may also need additional booster doses.  Hepatitis A vaccine.** / Consult your health care provider.  Hepatitis B vaccine.** / Consult your health care provider.  Haemophilus influenzae type b (Hib) vaccine.** / Consult your health care provider. Ages 47 to 61 years  Blood pressure check.** / Every year.  Lipid and cholesterol check.** / Every 5 years beginning at age 54 years.  Lung cancer screening. / Every year if you are aged 68-80 years and have a 30-pack-year history of smoking and currently smoke or have quit within the past 15 years. Yearly screening is stopped once you have quit smoking for at least 15 years or develop a health problem that would prevent you from having lung cancer treatment.  Clinical breast exam.** / Every year after age 42 years.  BRCA-related cancer risk assessment.** / For women who have family members with a BRCA-related cancer (breast, ovarian, tubal, or peritoneal cancers).  Mammogram.** / Every year beginning at age 53 years and continuing for as long as you are in good health. Consult with your health care provider.  Pap test.** / Every 3 years starting at age 66 years through age 75 or 62 years with a history of 3 consecutive normal Pap  tests.  HPV screening.** / Every 3 years from ages 77 years through ages 36 to 52 years with a history of 3 consecutive normal Pap tests.  Fecal occult blood test (FOBT) of stool. / Every year beginning at age 73 years and continuing until age 25 years. You may not need to do this test if you get a colonoscopy every 10 years.  Flexible sigmoidoscopy or colonoscopy.** / Every 5 years for a flexible sigmoidoscopy or every 10 years for a colonoscopy beginning at age 75 years and continuing until age 11 years.  Hepatitis C blood test.** / For all people born from [birth year] through 1966 and any individual with known risks for hepatitis C.  Skin self-exam. / Monthly.  Influenza vaccine. / Every year.  Tetanus, diphtheria, and acellular pertussis (Tdap/Td) vaccine.** / Consult your health care provider. Pregnant women should receive 1 dose of Tdap vaccine during each pregnancy. 1 dose of Td every 10 years.  Varicella vaccine.** / Consult your health care provider. Pregnant females who do not have evidence of immunity should receive the first dose after pregnancy.  Zoster vaccine.** / 1 dose for adults aged 28 years or older.  Measles, mumps, rubella (MMR) vaccine.** / You need at least 1 dose of MMR if you were born in 1957 or later. You may also need a second dose. For females of childbearing age, rubella immunity should be determined. If there is no evidence of immunity, females who are not pregnant should  be vaccinated. If there is no evidence of immunity, females who are pregnant should delay immunization until after pregnancy.  Pneumococcal 13-valent conjugate (PCV13) vaccine.** / Consult your health care provider.  Pneumococcal polysaccharide (PPSV23) vaccine.** / 1 to 2 doses if you smoke cigarettes or if you have certain conditions.  Meningococcal vaccine.** / Consult your health care provider.  Hepatitis A vaccine.** / Consult your health care provider.  Hepatitis B vaccine.** / Consult  your health care provider.  Haemophilus influenzae type b (Hib) vaccine.** / Consult your health care provider. Ages 5 years and over  Blood pressure check.** / Every year.  Lipid and cholesterol check.** / Every 5 years beginning at age 12 years.  Lung cancer screening. / Every year if you are aged 32-80 years and have a 30-pack-year history of smoking and currently smoke or have quit within the past 15 years. Yearly screening is stopped once you have quit smoking for at least 15 years or develop a health problem that would prevent you from having lung cancer treatment.  Clinical breast exam.** / Every year after age 22 years.  BRCA-related cancer risk assessment.** / For women who have family members with a BRCA-related cancer (breast, ovarian, tubal, or peritoneal cancers).  Mammogram.** / Every year beginning at age 55 years and continuing for as long as you are in good health. Consult with your health care provider.  Pap test.** / Every 3 years starting at age 85 years through age 37 or 69 years with 3 consecutive normal Pap tests. Testing can be stopped between 65 and 70 years with 3 consecutive normal Pap tests and no abnormal Pap or HPV tests in the past 10 years.  HPV screening.** / Every 3 years from ages 24 years through ages 69 or 91 years with a history of 3 consecutive normal Pap tests. Testing can be stopped between 65 and 70 years with 3 consecutive normal Pap tests and no abnormal Pap or HPV tests in the past 10 years.  Fecal occult blood test (FOBT) of stool. / Every year beginning at age 69 years and continuing until age 93 years. You may not need to do this test if you get a colonoscopy every 10 years.  Flexible sigmoidoscopy or colonoscopy.** / Every 5 years for a flexible sigmoidoscopy or every 10 years for a colonoscopy beginning at age 11 years and continuing until age 66 years.  Hepatitis C blood test.** / For all people born from [birth year] through 1966 and any  individual with known risks for hepatitis C.  Osteoporosis screening.** / A one-time screening for women ages 70 years and over and women at risk for fractures or osteoporosis.  Skin self-exam. / Monthly.  Influenza vaccine. / Every year.  Tetanus, diphtheria, and acellular pertussis (Tdap/Td) vaccine.** / 1 dose of Td every 10 years.  Varicella vaccine.** / Consult your health care provider.  Zoster vaccine.** / 1 dose for adults aged 40 years or older.  Pneumococcal 13-valent conjugate (PCV13) vaccine.** / Consult your health care provider.  Pneumococcal polysaccharide (PPSV23) vaccine.** / 1 dose for all adults aged 67 years and older.  Meningococcal vaccine.** / Consult your health care provider.  Hepatitis A vaccine.** / Consult your health care provider.  Hepatitis B vaccine.** / Consult your health care provider.  Haemophilus influenzae type b (Hib) vaccine.** / Consult your health care provider. ** Family history and personal history of risk and conditions may change your health care provider's recommendations.   This information is not  intended to replace advice given to you by your health care provider. Make sure you discuss any questions you have with your health care provider.   Document Released: 01/22/2002 Document Revised: 12/17/2014 Document Reviewed: 04/23/2011 Elsevier Interactive Patient Education [employer].

## 2015-12-28 NOTE — Progress Notes (Signed)
Pre visit review using our clinic review tool, if applicable. No additional management support is needed unless otherwise documented below in the visit note. 

## 2015-12-28 NOTE — Assessment & Plan Note (Signed)
Continue diet and exercise. Will repeat lipid panel today.

## 2015-12-28 NOTE — Progress Notes (Signed)
Patient presents to clinic today for annual exam.  Patient is fasting for labs.  Acute Concerns: No acute concerns. Body mass index is 40.01 kg/(m^2).  Chronic Issues: Hyperlipidemia -- Previously prescribed Pravastatin but was taken off by her fertility specialist. Has been working on diet and exercise. Is due for repeat lipids.  Health Maintenance: Immunizations -- up-to-date PAP -- up to date  Past Medical History  Diagnosis Date  . Depression   . Anxiety   . Hyperlipidemia   . Endometriosis   . Enlarged ovary     CYSTITIC LEFT SIDE  . History of ovarian cyst     dermoid  . Endometrioma   . History of anemia     Past Surgical History  Procedure Laterality Date  . Ovarian cyst removal    . Laparoscopy ovarian cystectomy  2013    dermoid  . Laparoscopic ovarian cystectomy Left 06/17/2015    Procedure: LAPAROSCOPIC OVARIAN CYSTECTOMY, LYSIS OF ADHESIONS;  Surgeon: Arvella Nigh, MD;  Location: [facility];  Service: Gynecology;  Laterality: Left;    Current Outpatient Prescriptions on File Prior to Visit  Medication Sig Dispense Refill  . ibuprofen (ADVIL,MOTRIN) 200 MG tablet Take 200 mg by mouth every 6 (six) hours as needed.    . prenatal vitamin w/FE, FA (PRENATAL 1 + 1) 27-1 MG TABS tablet Take 1 tablet by mouth daily at 12 noon.     No current facility-administered medications on file prior to visit.    No Known Allergies  Family History  Problem Relation Age of Onset  . Cancer Mother     breast  . Diabetes Mother     and mother's side of family  . Hyperlipidemia Mother   . Cancer Father     thyroid  . Heart disease Maternal Uncle   . Hyperlipidemia Maternal Grandfather   . Hypertension Maternal Grandfather   . Diabetes Maternal Grandfather     Social History   Social History  . Marital Status: Married    Spouse Name: N/A  . Number of Children: N/A  . Years of Education: N/A   Occupational History  . Not on file.   Social  History Main Topics  . Smoking status: Former Smoker -- 1.00 packs/day for 10 years    Types: Cigarettes    Quit date: 11/23/2011  . Smokeless tobacco: Never Used  . Alcohol Use: Yes     Comment: rare  . Drug Use: No  . Sexual Activity: Not on file   Other Topics Concern  . Not on file   Social History Narrative   Review of Systems  Constitutional: Negative for fever and weight loss.  HENT: Negative for ear discharge, ear pain, hearing loss and tinnitus.   Eyes: Negative for blurred vision, double vision, photophobia and pain.  Respiratory: Negative for cough and shortness of breath.   Cardiovascular: Negative for chest pain and palpitations.  Gastrointestinal: Negative for heartburn, nausea, vomiting, abdominal pain, diarrhea, constipation, blood in stool and melena.  Genitourinary: Negative for dysuria, urgency, frequency, hematuria and flank pain.  Musculoskeletal: Negative for falls.  Neurological: Negative for dizziness, loss of consciousness and headaches.  Endo/Heme/Allergies: Negative for environmental allergies.  Psychiatric/Behavioral: Negative for depression, suicidal ideas, hallucinations and substance abuse. The patient is not nervous/anxious and does not have insomnia.     BP 111/53 mmHg  Pulse 80  Temp(Src) 97.9 F (36.6 C) (Oral)  Ht 5\' 2"  (1.575 m)  Wt 218 lb 12.8 oz (  99.247 kg)  BMI 40.01 kg/m2  SpO2 100%  LMP 12/15/2015  Physical Exam  Constitutional: She is oriented to person, place, and time and well-developed, well-nourished, and in no distress.  HENT:  Head: Normocephalic and atraumatic.  Right Ear: Tympanic membrane, external ear and ear canal normal.  Left Ear: Tympanic membrane, external ear and ear canal normal.  Nose: Nose normal. No mucosal edema.  Mouth/Throat: Uvula is midline, oropharynx is clear and moist and mucous membranes are normal. No oropharyngeal exudate or posterior oropharyngeal erythema.  Eyes: Conjunctivae are normal. Pupils  are equal, round, and reactive to light.  Neck: Neck supple. No thyromegaly present.  Cardiovascular: Normal rate, regular rhythm, normal heart sounds and intact distal pulses.   Pulmonary/Chest: Effort normal and breath sounds normal. No respiratory distress. She has no wheezes. She has no rales.  Abdominal: Soft. Bowel sounds are normal. She exhibits no distension and no mass. There is no tenderness. There is no rebound and no guarding.  Genitourinary:  Patient defers to Gynecology  Lymphadenopathy:    She has no cervical adenopathy.  Neurological: She is alert and oriented to person, place, and time. No cranial nerve deficit.  Skin: Skin is warm and dry. No rash noted.  Psychiatric: Affect normal.  Vitals reviewed.   No results found for this or any previous visit (from the past 2160 hour(s)).  Assessment/Plan: Visit for preventive health examination Depression screen negative. Health Maintenance reviewed -- Followed by GYN for PAP which is up-to-date. Will be having first mammogram this year. Up-to-date on immunizations. Preventive schedule discussed and handout given in AVS. Will obtain fasting labs today.   Hyperlipidemia Continue diet and exercise. Will repeat lipid panel today.

## 2016-01-18 MED FILL — METFORMIN HCL ER 500 MG TAB: 500 | 28 days supply | Qty: 50 | Fill #0

## 2016-01-19 MED FILL — MONO-LINYAH 28 TABLET: 0.25-35 | 28 days supply | Qty: 28 | Fill #0

## 2016-02-15 MED FILL — METFORMIN HCL ER 500 MG TAB: 500 | 28 days supply | Qty: 50 | Fill #1

## 2016-02-15 MED FILL — MONO-LINYAH 28 TABLET: 0.25-35 | 28 days supply | Qty: 28 | Fill #1

## 2016-02-27 ENCOUNTER — Encounter (HOSPITAL_BASED_OUTPATIENT_CLINIC_OR_DEPARTMENT_OTHER): Payer: Self-pay | Admitting: *Deleted

## 2016-02-27 NOTE — Progress Notes (Signed)
Pt instructed npo m mn 3/27. To [facility] 3/28 @ 0930.  Needs hgb , urine hcg on arrival. Pt planned to use [employer] . Pt informed that a responsible adult would have to accompany her in [place].  Pt verbalized her understanding.

## 2016-02-28 NOTE — H&P (Signed)
Cynthia [name] is a 35 y.o. female , originally referred to me by Dr. Almon Hercules, for an infertility evaluation.  She was diagnosed with an endometrial polyp by recent ultrasound. The polyp measures 1.33x0.73x0.79/ Patient would like to preserve her childbearing potential.  Pertinent Gynecological History: Menses: Normal Bleeding: Normal Contraception: none DES exposure: denies Blood transfusions: none Sexually transmitted diseases: no past history Previous GYN Procedures: Ovarian Cystectomy  Last mammogram: normal Last pap: normal  OB History: G 0   Menstrual History: Menarche age: 60 No LMP recorded.    Past Medical History  Diagnosis Date  . H/O seasonal allergies   . Anemia     hx of anemia  . Wears glasses                     Past Surgical History  Procedure Laterality Date  . Laparoscopic ovarian cystectomy  2014, 2016             History reviewed. No pertinent family history. No hereditary disease.  No cancer of breast, ovary, uterus. No cutaneous leiomyomatosis or renal cell carcinoma.  Social History   Social History  . Marital Status: Married    Spouse Name: N/A  . Number of Children: N/A  . Years of Education: N/A   Occupational History  . Not on file.   Social History Main Topics  . Smoking status: Former Smoker    Types: Cigarettes    Quit date: 02/27/2008  . Smokeless tobacco: Never Used  . Alcohol Use: Yes     Comment: rare 1-2 glasses of wine /month  . Drug Use: Not on file  . Sexual Activity: Not on file   Other Topics Concern  . Not on file   Social History Narrative  . No narrative on file    No Known Allergies  No current facility-administered medications on file prior to encounter.   No current outpatient prescriptions on file prior to encounter.     Review of Systems  Constitutional: Negative.   HENT: Negative.   Eyes: Negative.   Respiratory: Negative.   Cardiovascular: Negative.   Gastrointestinal: Negative.    Genitourinary: Negative.   Musculoskeletal: Negative.   Skin: Negative.   Neurological: Negative.   Endo/Heme/Allergies: Negative.   Psychiatric/Behavioral: Negative.      Physical Exam  Ht 5\' 2"  (1.575 m)  Wt 98.884 kg (218 lb)  BMI 39.86 kg/m2  LMP 02/12/2016 (Approximate) Constitutional: She is oriented to person, place, and time. She appears well-developed and well-nourished.  HENT:  Head: Normocephalic and atraumatic.  Nose: Nose normal.  Mouth/Throat: Oropharynx is clear and moist. No oropharyngeal exudate.  Eyes: Conjunctivae normal and EOM are normal. Pupils are equal, round, and reactive to light. No scleral icterus.  Neck: Normal range of motion. Neck supple. No tracheal deviation present. No thyromegaly present.  Cardiovascular: Normal rate.   Respiratory: Effort normal and breath sounds normal.  GI: Soft. Bowel sounds are normal. She exhibits no distension and no mass. There is no tenderness.  Lymphadenopathy:    She has no cervical adenopathy.  Neurological: She is alert and oriented to person, place, and time. She has normal reflexes.  Skin: Skin is warm.  Psychiatric: She has a normal mood and affect. Her behavior is normal. Judgment and thought content normal.       Assessment/Plan:  Hysteroscopy, Polypectomy.

## 2016-03-06 ENCOUNTER — Encounter (HOSPITAL_BASED_OUTPATIENT_CLINIC_OR_DEPARTMENT_OTHER): Payer: Self-pay | Admitting: *Deleted

## 2016-03-06 ENCOUNTER — Ambulatory Visit (HOSPITAL_BASED_OUTPATIENT_CLINIC_OR_DEPARTMENT_OTHER)
Admission: RE | Admit: 2016-03-06 | Discharge: 2016-03-06 | Disposition: A | Discharge status: Home / Self Care | Payer: 59 | Source: Ambulatory Visit | Attending: Obstetrics and Gynecology | Admitting: Obstetrics and Gynecology

## 2016-03-06 ENCOUNTER — Ambulatory Visit (HOSPITAL_BASED_OUTPATIENT_CLINIC_OR_DEPARTMENT_OTHER): Payer: 59 | Admitting: Anesthesiology

## 2016-03-06 ENCOUNTER — Encounter (HOSPITAL_BASED_OUTPATIENT_CLINIC_OR_DEPARTMENT_OTHER)
Admission: RE | Disposition: A | Discharge status: Home / Self Care | Payer: Self-pay | Source: Ambulatory Visit | Attending: Obstetrics and Gynecology

## 2016-03-06 DIAGNOSIS — N840 Polyp of corpus uteri: Secondary | ICD-10-CM | POA: Insufficient documentation

## 2016-03-06 DIAGNOSIS — N838 Other noninflammatory disorders of ovary, fallopian tube and broad ligament: Secondary | ICD-10-CM | POA: Diagnosis not present

## 2016-03-06 DIAGNOSIS — Z6839 Body mass index (BMI) 39.0-39.9, adult: Secondary | ICD-10-CM | POA: Diagnosis not present

## 2016-03-06 DIAGNOSIS — D649 Anemia, unspecified: Secondary | ICD-10-CM | POA: Insufficient documentation

## 2016-03-06 DIAGNOSIS — Z87891 Personal history of nicotine dependence: Secondary | ICD-10-CM | POA: Insufficient documentation

## 2016-03-06 HISTORY — DX: Presence of spectacles and contact lenses: Z97.3

## 2016-03-06 HISTORY — PX: HYSTEROSCOPY: SHX211

## 2016-03-06 HISTORY — DX: Anemia, unspecified: D64.9

## 2016-03-06 HISTORY — DX: Allergy status to unspecified drugs, medicaments and biological substances: Z88.9

## 2016-03-06 LAB — POCT PREGNANCY, URINE: PREG TEST UR: NEGATIVE

## 2016-03-06 LAB — POCT HEMOGLOBIN-HEMACUE: HEMOGLOBIN: 11.5 g/dL — AB (ref 12.0–15.0)

## 2016-03-06 SURGERY — HYSTEROSCOPY
Anesthesia: General | Site: Uterus

## 2016-03-06 MED ORDER — OXYCODONE HCL 5 MG PO TABS
5.0000 mg | ORAL_TABLET | Freq: Once | ORAL | Status: DC | PRN
  Filled 2016-03-06: qty 1

## 2016-03-06 MED ORDER — KETOROLAC TROMETHAMINE 15 MG/ML IJ SOLN
30.0000 mg | Freq: Once | INTRAMUSCULAR | Status: AC
  Administered 2016-03-06: 30 mg via INTRAVENOUS
  Filled 2016-03-06: qty 2

## 2016-03-06 MED ORDER — ACETAMINOPHEN 325 MG PO TABS
325.0000 mg | ORAL_TABLET | ORAL | Status: DC | PRN
  Filled 2016-03-06: qty 2

## 2016-03-06 MED ORDER — PROPOFOL 10 MG/ML IV BOLUS
INTRAVENOUS | Status: AC
  Filled 2016-03-06: qty 20

## 2016-03-06 MED ORDER — VASOPRESSIN 20 UNIT/ML IV SOLN
INTRAVENOUS | Status: DC | PRN
  Administered 2016-03-06: 7 mL via INTRAMUSCULAR

## 2016-03-06 MED ORDER — KETOROLAC TROMETHAMINE 30 MG/ML IJ SOLN
INTRAMUSCULAR | Status: AC
  Filled 2016-03-06: qty 1

## 2016-03-06 MED ORDER — SODIUM CHLORIDE 0.9 % IR SOLN
Status: DC | PRN
  Administered 2016-03-06: 500 mL
  Administered 2016-03-06: 3000 mL

## 2016-03-06 MED ORDER — MIDAZOLAM HCL 5 MG/5ML IJ SOLN
INTRAMUSCULAR | Status: DC | PRN
  Administered 2016-03-06: 2 mg via INTRAVENOUS

## 2016-03-06 MED ORDER — MIDAZOLAM HCL 2 MG/2ML IJ SOLN
INTRAMUSCULAR | Status: AC
  Filled 2016-03-06: qty 2

## 2016-03-06 MED ORDER — ONDANSETRON HCL 4 MG/2ML IJ SOLN
4.0000 mg | Freq: Once | INTRAMUSCULAR | Status: AC
  Administered 2016-03-06: 4 mg via INTRAVENOUS
  Filled 2016-03-06: qty 2

## 2016-03-06 MED ORDER — PROPOFOL 10 MG/ML IV BOLUS
INTRAVENOUS | Status: DC | PRN
  Administered 2016-03-06: 200 mg via INTRAVENOUS

## 2016-03-06 MED ORDER — ACETAMINOPHEN 160 MG/5ML PO SOLN
325.0000 mg | ORAL | Status: DC | PRN
  Filled 2016-03-06: qty 20.3

## 2016-03-06 MED ORDER — ONDANSETRON HCL 4 MG/2ML IJ SOLN
INTRAMUSCULAR | Status: AC
  Filled 2016-03-06: qty 2

## 2016-03-06 MED ORDER — CEFAZOLIN SODIUM-DEXTROSE 2-4 GM/100ML-% IV SOLN
2.0000 g | INTRAVENOUS | Status: AC
  Administered 2016-03-06: 2 g via INTRAVENOUS
  Filled 2016-03-06: qty 100

## 2016-03-06 MED ORDER — DEXAMETHASONE SODIUM PHOSPHATE 4 MG/ML IJ SOLN
INTRAMUSCULAR | Status: DC | PRN
  Administered 2016-03-06: 10 mg via INTRAVENOUS

## 2016-03-06 MED ORDER — FENTANYL CITRATE (PF) 100 MCG/2ML IJ SOLN
INTRAMUSCULAR | Status: AC
  Filled 2016-03-06: qty 2

## 2016-03-06 MED ORDER — FENTANYL CITRATE (PF) 100 MCG/2ML IJ SOLN
INTRAMUSCULAR | Status: DC | PRN
  Administered 2016-03-06: 50 ug via INTRAVENOUS
  Administered 2016-03-06 (×2): 25 ug via INTRAVENOUS
  Administered 2016-03-06: 50 ug via INTRAVENOUS

## 2016-03-06 MED ORDER — CEFAZOLIN SODIUM-DEXTROSE 2-3 GM-% IV SOLR
INTRAVENOUS | Status: AC
  Filled 2016-03-06: qty 50

## 2016-03-06 MED ORDER — LACTATED RINGERS IV SOLN
INTRAVENOUS | Status: DC
  Administered 2016-03-06 (×3): via INTRAVENOUS
  Filled 2016-03-06: qty 1000

## 2016-03-06 MED ORDER — ONDANSETRON HCL 4 MG/2ML IJ SOLN
INTRAMUSCULAR | Status: DC | PRN
  Administered 2016-03-06: 4 mg via INTRAVENOUS

## 2016-03-06 MED ORDER — LIDOCAINE HCL (CARDIAC) 20 MG/ML IV SOLN
INTRAVENOUS | Status: DC | PRN
  Administered 2016-03-06: 60 mg via INTRAVENOUS

## 2016-03-06 MED ORDER — FENTANYL CITRATE (PF) 100 MCG/2ML IJ SOLN
25.0000 ug | INTRAMUSCULAR | Status: DC | PRN
  Filled 2016-03-06: qty 1

## 2016-03-06 MED ORDER — OXYCODONE HCL 5 MG/5ML PO SOLN
5.0000 mg | Freq: Once | ORAL | Status: DC | PRN
  Filled 2016-03-06: qty 5

## 2016-03-06 SURGICAL SUPPLY — 44 items
CANISTER SUCTION 2500CC (MISCELLANEOUS) ×2 IMPLANT
CATH HSG 5FRX28CM (CATHETERS) IMPLANT
CATH INTRA ACCESS BALLN (BALLOONS) IMPLANT
CATH ROBINSON RED A/P 16FR (CATHETERS) ×2 IMPLANT
CATH SALPINGOGRAPHY SELECT (BALLOONS) IMPLANT
CATH SSG INJECTION W/GUIDEWIRE (BALLOONS) IMPLANT
CORD ACTIVE DISPOSABLE (ELECTRODE)
CORD ELECTRO ACTIVE DISP (ELECTRODE) IMPLANT
COVER BACK TABLE 60X90IN (DRAPES) ×2 IMPLANT
DRAPE LG THREE QUARTER DISP (DRAPES) ×4 IMPLANT
ELECT LOOP GYNE PRO 24FR (CUTTING LOOP)
ELECT REM PT RETURN 9FT ADLT (ELECTROSURGICAL)
ELECT VAPORTRODE GRVD BAR (ELECTRODE) IMPLANT
ELECTRODE KNIFE SHAPED 22FR (ELECTROSURGICAL) IMPLANT
ELECTRODE LOOP GYNE PRO 24FR (CUTTING LOOP) IMPLANT
ELECTRODE REM PT RTRN 9FT ADLT (ELECTROSURGICAL) IMPLANT
GLOVE BIO SURGEON STRL SZ 6.5 (GLOVE) ×2 IMPLANT
GLOVE BIO SURGEON STRL SZ7.5 (GLOVE) ×2 IMPLANT
GLOVE BIO SURGEON STRL SZ8 (GLOVE) ×2 IMPLANT
GLOVE BIOGEL PI IND STRL 8.5 (GLOVE) ×1 IMPLANT
GLOVE BIOGEL PI INDICATOR 8.5 (GLOVE) ×1
GLOVE INDICATOR 6.5 STRL GRN (GLOVE) ×2 IMPLANT
GLOVE INDICATOR 7.5 STRL GRN (GLOVE) ×2 IMPLANT
GOWN STRL REUS W/ TWL LRG LVL3 (GOWN DISPOSABLE) ×1 IMPLANT
GOWN STRL REUS W/ TWL XL LVL3 (GOWN DISPOSABLE) ×2 IMPLANT
GOWN STRL REUS W/TWL LRG LVL3 (GOWN DISPOSABLE) ×1
GOWN STRL REUS W/TWL XL LVL3 (GOWN DISPOSABLE) ×6 IMPLANT
KIT ROOM TURNOVER WOR (KITS) ×2 IMPLANT
LEGGING LITHOTOMY PAIR STRL (DRAPES) ×2 IMPLANT
LOOP ANGLED CUTTING 22FR (CUTTING LOOP) IMPLANT
MANIFOLD NEPTUNE II (INSTRUMENTS) IMPLANT
NEEDLE SPNL 22GX3.5 QUINCKE BK (NEEDLE) ×2 IMPLANT
PACK BASIN DAY SURGERY FS (CUSTOM PROCEDURE TRAY) ×2 IMPLANT
PAD OB MATERNITY 4.3X12.25 (PERSONAL CARE ITEMS) ×2 IMPLANT
SET IRRIG Y TYPE TUR BLADDER L (SET/KITS/TRAYS/PACK) ×2 IMPLANT
STENT BALLN UTERINE 4CM 6FR (STENTS) IMPLANT
SUT SILK 2 0 SH (SUTURE) IMPLANT
SUT SILK 3 0 PS 1 (SUTURE) IMPLANT
SYR 20CC LL (SYRINGE) IMPLANT
SYR 3ML 18GX1 1/2 (SYRINGE) ×2 IMPLANT
TOWEL OR 17X24 6PK STRL BLUE (TOWEL DISPOSABLE) ×4 IMPLANT
TRAY DSU PREP LF (CUSTOM PROCEDURE TRAY) ×2 IMPLANT
TUBE CONNECTING 12X1/4 (SUCTIONS) ×2 IMPLANT
WATER STERILE IRR 500ML POUR (IV SOLUTION) ×2 IMPLANT

## 2016-03-06 NOTE — Op Note (Signed)
OPERATIVE NOTE  Preoperative diagnosis: Endometrial polyp  Postoperative diagnosis: The same  Procedure: Hysteroscopy, polypectomy,  D&C  Surgeon: Governor Specking  Anesthesia: General  Complications: None  Estimated blood loss: Less than 20 mL  Specimen: Endometrial curettings to pathology  Findings: Uterus was normal sized, anteverted and mobile. There were no posterior fornix nodularities. There were no adnexal masses.  Endocervical canal appeared normal. The uterus sounded to 8.5 cm.  endometrium was thin, as the patient has been on birth control pills. Endometrial cavity had a 1.5 x 0.5 cm polyp at the fundus, near the right tubal ostium. There was a 0.5 x 0.5 cm polyp and a 1 x 0.5 cm polyp in the posterior left lower uterine segment. Finally there were 2 polyps at the level of the internal os on the left side: 0.5 x 0.5 cm each. Both tubal ostia were seen.  Description of procedure: Patient was placed in dorsal supine position. General anesthesia was administered. She was placed in lithotomy position. She was prepped and draped in sterile manner. A vaginal speculum was placed. A dilute vasopressin solution containing 0.33 units per milliliter was injected into the cervical stroma x5 cc. A Slimline hysteroscope with 30 lens was inserted into the canal and above findings were noted. Distention medium was  saline. Distention method wasgravityove findings were noted.  Using hysteroscopic scissors each polyp was excised right at its stalk and then grasped with hysteroscopic graspers and removed and submitted to pathology.  All of the polyps were taken out. Using a sharp curet endometrium was curetted and the specimen was sent to pathology.  Hemostasis was insured. Instrument count was correct. Estimated blood loss was less than 20 mL. The patient tolerated the procedure well and was transferred to recovery in satisfactory condition.  Patient will stop the birth control pills as of today.  In 2 days she will start Femara 7.5 mg x 5 d. I will see her in the office in 8 days for a follicle scan.  Governor Specking, MD

## 2016-03-06 NOTE — Transfer of Care (Signed)
Immediate Anesthesia Transfer of Care Note  Patient: Cynthia [name]  Procedure(s) Performed: Procedure(s): HYSTEROSCOPY WITH POLYPECTOMY AND D & C (N/A)  Patient Location: PACU  Anesthesia Type:General  Level of Consciousness: awake, alert  and oriented  Airway & Oxygen Therapy: Patient Spontanous Breathing and Patient connected to face mask oxygen  Post-op Assessment: Report given to RN and Post -op Vital signs reviewed and stable  Post vital signs: Reviewed and stable  Last Vitals:  Filed Vitals:   03/06/16 0916  BP: 126/76  Pulse: 82  Temp: 36.7 C  Resp: 16    Complications: No apparent anesthesia complications

## 2016-03-06 NOTE — Discharge Instructions (Addendum)

## 2016-03-06 NOTE — Anesthesia Preprocedure Evaluation (Signed)
Anesthesia Evaluation  Patient identified by MRN, date of birth, ID band Patient awake    Reviewed: Allergy & Precautions, NPO status , Patient's Chart, lab work & pertinent test results  History of Anesthesia Complications Negative for: history of anesthetic complications  Airway Mallampati: I  TM Distance: >3 FB Neck ROM: Full    Dental  (+) Teeth Intact   Pulmonary neg shortness of breath, neg sleep apnea, neg COPD, neg recent URI, former smoker,    breath sounds clear to auscultation       Cardiovascular negative cardio ROS   Rhythm:Regular     Neuro/Psych negative neurological ROS  negative psych ROS   GI/Hepatic negative GI ROS, Neg liver ROS,   Endo/Other  Morbid obesity  Renal/GU negative Renal ROS     Musculoskeletal negative musculoskeletal ROS (+)   Abdominal   Peds  Hematology  (+) anemia ,   Anesthesia Other Findings   Reproductive/Obstetrics                             Anesthesia Physical Anesthesia Plan  ASA: II  Anesthesia Plan: General   Post-op Pain Management:    Induction: Intravenous  Airway Management Planned: LMA  Additional Equipment: None  Intra-op Plan:   Post-operative Plan: Extubation in OR  Informed Consent: I have reviewed the patients History and Physical, chart, labs and discussed the procedure including the risks, benefits and alternatives for the proposed anesthesia with the patient or authorized representative who has indicated his/her understanding and acceptance.   Dental advisory given  Plan Discussed with: CRNA and Surgeon  Anesthesia Plan Comments:         Anesthesia Quick Evaluation

## 2016-03-06 NOTE — Anesthesia Postprocedure Evaluation (Signed)
Anesthesia Post Note  Patient: Cynthia [name]  Procedure(s) Performed: Procedure(s) (LRB): HYSTEROSCOPY WITH POLYPECTOMY AND D & C (N/A)  Patient location during evaluation: PACU Anesthesia Type: General Level of consciousness: awake Pain management: pain level controlled Vital Signs Assessment: post-procedure vital signs reviewed and stable Respiratory status: spontaneous breathing and respiratory function stable Cardiovascular status: stable Postop Assessment: no signs of nausea or vomiting Anesthetic complications: no    Last Vitals:  Filed Vitals:   03/06/16 1200 03/06/16 1215  BP: 138/81 136/77  Pulse: 66 66  Temp:    Resp: 17 12    Last Pain:  Filed Vitals:   03/06/16 1259  PainSc: 2                  Bunnie Rehberg

## 2016-03-06 NOTE — Anesthesia Procedure Notes (Signed)
Procedure Name: LMA Insertion Date/Time: 03/06/2016 11:08 AM Performed by: Maryella Shivers Pre-anesthesia Checklist: Patient identified, Emergency Drugs available, Suction available and Patient being monitored Patient Re-evaluated:Patient Re-evaluated prior to inductionOxygen Delivery Method: Circle System Utilized Preoxygenation: Pre-oxygenation with 100% oxygen Intubation Type: IV induction Ventilation: Mask ventilation without difficulty LMA: LMA inserted LMA Size: 4.0 Number of attempts: 1 Airway Equipment and Method: Bite block Placement Confirmation: positive ETCO2 Tube secured with: Tape Dental Injury: Teeth and Oropharynx as per pre-operative assessment

## 2016-03-07 ENCOUNTER — Encounter (HOSPITAL_BASED_OUTPATIENT_CLINIC_OR_DEPARTMENT_OTHER): Payer: Self-pay | Admitting: Obstetrics and Gynecology

## 2016-04-13 DIAGNOSIS — Z319 Encounter for procreative management, unspecified: Secondary | ICD-10-CM | POA: Diagnosis not present

## 2016-04-13 MED FILL — metFORMIN HCL 1000 MG TABS: 1000 | 30 days supply | Qty: 60 | Fill #0

## 2016-04-13 MED FILL — OVIDREL 250 MCG/0.5 ML SYRG: 250 | 30 days supply | Qty: 1 | Fill #0

## 2016-05-03 MED FILL — LETROZOLE 2.5 MG TABLET: 2.5 | 5 days supply | Qty: 15 | Fill #0

## 2016-05-11 DIAGNOSIS — Z319 Encounter for procreative management, unspecified: Secondary | ICD-10-CM | POA: Diagnosis not present

## 2016-05-11 MED FILL — OVIDREL 250 MCG/0.5 ML SYRG: 250 | 30 days supply | Qty: 1 | Fill #1

## 2016-05-28 MED FILL — LETROZOLE 2.5 MG TABLET: 2.5 | 5 days supply | Qty: 15 | Fill #1

## 2016-06-07 DIAGNOSIS — E288 Other ovarian dysfunction: Secondary | ICD-10-CM | POA: Diagnosis not present

## 2016-06-19 DIAGNOSIS — N938 Other specified abnormal uterine and vaginal bleeding: Secondary | ICD-10-CM | POA: Diagnosis not present

## 2016-06-19 DIAGNOSIS — E288 Other ovarian dysfunction: Secondary | ICD-10-CM | POA: Diagnosis not present

## 2016-06-19 MED FILL — LETROZOLE 2.5 MG TABLET: 2.5 | 5 days supply | Qty: 15 | Fill #0

## 2016-06-21 ENCOUNTER — Telehealth: Payer: Self-pay | Admitting: Family Medicine

## 2016-06-21 MED FILL — metFORMIN HCL 1000 MG TABS: 1000 | 30 days supply | Qty: 60 | Fill #0

## 2016-06-21 NOTE — Telephone Encounter (Signed)
Called pt and LMOVM to return call.  °

## 2016-06-21 NOTE — Telephone Encounter (Signed)
Pt states that she did not change pcp to [facility] who did her cpe in Jan and is wanting to make an appt with Dr Birdie Riddle. Is this ok to schedule?

## 2016-06-21 NOTE — Telephone Encounter (Signed)
Ok to schedule.

## 2016-06-22 ENCOUNTER — Ambulatory Visit (INDEPENDENT_AMBULATORY_CARE_PROVIDER_SITE_OTHER): Payer: 59 | Admitting: Family Medicine

## 2016-06-22 ENCOUNTER — Encounter: Payer: Self-pay | Admitting: Family Medicine

## 2016-06-22 VITALS — BP 112/82 | HR 90 | Temp 97.9°F | Resp 16 | Ht 62.0 in | Wt 219.2 lb

## 2016-06-22 DIAGNOSIS — H1011 Acute atopic conjunctivitis, right eye: Secondary | ICD-10-CM | POA: Diagnosis not present

## 2016-06-22 DIAGNOSIS — M25461 Effusion, right knee: Secondary | ICD-10-CM

## 2016-06-22 NOTE — Progress Notes (Signed)
Pre visit review using our clinic review tool, if applicable. No additional management support is needed unless otherwise documented below in the visit note. 

## 2016-06-22 NOTE — Patient Instructions (Signed)
Follow up as needed Your knee swelling is most likely inflammation of the lateral quad tendon but this is not a tear.  Start 3 ibuprofen 3x/day w/ meals.  Ice when sitting. It is ok to exercise! The itching and burning of your eye appears to be allergy related.  Add a daily Claritin or Zyrtec.  OTC Visine Allergy drops as needed.  Try and avoid itching or rubbing the eye. Call if no improvement in either! Have a great weekend!!!

## 2016-06-22 NOTE — Progress Notes (Signed)
   Subjective:    Patient ID: Cynthia [name], female    DOB: [birth year], 35 y.o.   MRN: [id number]  HPI R knee swelling- pt reports doing a spin class 3 days ago and the next AM there was swelling.  No pain but some decreased ROM.  Pt reports the swelling has decreased today.  'i have 0 pain'.  Able to weight bear w/o difficulty.  Some stiffness w/ prolonged sitting.  R eye itching- pt reports lower lid redness, itching, burning.  Need to rub eye.  No visual changes.  No upper lid changes.  Review of Systems For ROS see HPI     Objective:   Physical Exam  Constitutional: She is oriented to person, place, and time. She appears well-developed and well-nourished. No distress.  obese  HENT:  Head: Normocephalic and atraumatic.  Eyes: Conjunctivae and EOM are normal. Pupils are equal, round, and reactive to light. Right eye exhibits no discharge. Left eye exhibits no discharge.  R lower lid w/ mild erythema Pt rubbing eye throughout visit No drainage or discharge, no conjunctival abnormality  Cardiovascular: Intact distal pulses.   Musculoskeletal: She exhibits edema (swelling over insertion of R vastus lateralis w/o TTP). She exhibits no tenderness.  Full ROM of R knee  Neurological: She is alert and oriented to person, place, and time. She has normal reflexes.  Skin: Skin is warm and dry.  Psychiatric: She has a normal mood and affect. Her behavior is normal. Thought content normal.  Vitals reviewed.         Assessment & Plan:  R knee swelling- new.  This appears to be an inflammatory response but there is no pain.  Start scheduled NSAIDs (pt has at home) and ice for improvement in swelling.  Since there is no pain, pt is cleared to exercise.  If no improvement, pt is to call for ortho appt.  Pt expressed understanding and is in agreement w/ plan.   R eye allergies- new.  Conjunctiva normal- no evidence of inflammation or corneal abrasion.  sxs consistent w/ eye allergies.  Add  daily antihistmaine.  OTC eye drops prn.  Reviewed supportive care and red flags that should prompt return.  Pt expressed understanding and is in agreement w/ plan.

## 2016-06-25 DIAGNOSIS — Z319 Encounter for procreative management, unspecified: Secondary | ICD-10-CM | POA: Diagnosis not present

## 2016-06-25 MED FILL — OVIDREL 250 MCG/0.5 ML SYRG: 250 | 30 days supply | Qty: 1 | Fill #2

## 2016-07-02 ENCOUNTER — Encounter: Payer: Self-pay | Admitting: Family Medicine

## 2016-07-12 MED FILL — LETROZOLE 2.5 MG TABLET: 2.5 | 5 days supply | Qty: 15 | Fill #1

## 2016-07-19 MED FILL — OVIDREL 250 MCG/0.5 ML SYRG: 250 | 30 days supply | Qty: 1 | Fill #3

## 2016-07-20 DIAGNOSIS — Z319 Encounter for procreative management, unspecified: Secondary | ICD-10-CM | POA: Diagnosis not present

## 2016-07-23 DIAGNOSIS — Z3189 Encounter for other procreative management: Secondary | ICD-10-CM | POA: Diagnosis not present

## 2016-08-03 MED FILL — metFORMIN HCL 1000 MG TABS: 1000 | 30 days supply | Qty: 60 | Fill #1

## 2016-08-06 MED FILL — LETROZOLE 2.5 MG TABLET: 2.5 | 5 days supply | Qty: 15 | Fill #2

## 2016-08-15 DIAGNOSIS — N801 Endometriosis of ovary: Secondary | ICD-10-CM | POA: Diagnosis not present

## 2016-08-15 DIAGNOSIS — Z319 Encounter for procreative management, unspecified: Secondary | ICD-10-CM | POA: Diagnosis not present

## 2016-08-15 MED FILL — OVIDREL 250 MCG/0.5 ML SYRG: 250 | 30 days supply | Qty: 1 | Fill #0

## 2016-08-18 DIAGNOSIS — Z3189 Encounter for other procreative management: Secondary | ICD-10-CM | POA: Diagnosis not present

## 2016-09-14 MED FILL — metFORMIN HCL 1000 MG TABS: 1000 | 30 days supply | Qty: 60 | Fill #2

## 2016-09-17 DIAGNOSIS — E669 Obesity, unspecified: Secondary | ICD-10-CM | POA: Diagnosis not present

## 2016-09-17 DIAGNOSIS — Z113 Encounter for screening for infections with a predominantly sexual mode of transmission: Secondary | ICD-10-CM | POA: Diagnosis not present

## 2016-09-17 DIAGNOSIS — E282 Polycystic ovarian syndrome: Secondary | ICD-10-CM | POA: Diagnosis not present

## 2016-09-17 DIAGNOSIS — N8500 Endometrial hyperplasia, unspecified: Secondary | ICD-10-CM | POA: Diagnosis not present

## 2016-09-17 DIAGNOSIS — N801 Endometriosis of ovary: Secondary | ICD-10-CM | POA: Diagnosis not present

## 2016-09-17 MED FILL — SYNAREL 2 MG/ML NASAL SPRAY: 2 | 30 days supply | Qty: 8 | Fill #0

## 2016-09-17 MED FILL — DOXYCYCLINE HYCLATE 100 MG: 100 | 5 days supply | Qty: 10 | Fill #0

## 2016-09-19 MED FILL — GONAL-F 1,050 UNITS VIAL: 1050 | 30 days supply | Qty: 2 | Fill #0

## 2016-09-19 MED FILL — PREGNYL 10,000 UNITS VIAL: 10000 | 30 days supply | Qty: 1 | Fill #0

## 2016-09-19 MED FILL — BD NEEDLES 30GX0.5": 30G X 1/2" | 20 days supply | Qty: 20 | Fill #0

## 2016-09-19 MED FILL — METHYLPREDNISOLONE 4 MG TAB: 4 | 4 days supply | Qty: 16 | Fill #0

## 2016-09-19 MED FILL — BD NEEDLES 22GX1.5: 22G X 1-1/2 | 30 days supply | Qty: 30 | Fill #0

## 2016-09-19 MED FILL — BD NEEDLES 22GX1.5": 22G X 1-1/2 | 30 days supply | Qty: 30 | Fill #0

## 2016-09-19 MED FILL — PROGESTERONE OIL 50 MG/ML V: 50 | 30 days supply | Qty: 30 | Fill #0

## 2016-09-19 MED FILL — MENOPUR 75 UNIT VIAL: 75 | 30 days supply | Qty: 10 | Fill #0

## 2016-09-19 MED FILL — BD 3 ML SYRINGE 18GX1-1/2": 18G X 1-1/2 | 30 days supply | Qty: 50 | Fill #0

## 2016-09-19 MED FILL — GONAL-F RFF REDI-JECT 450 U: 450 | 30 days supply | Qty: 1 | Fill #0

## 2016-09-19 MED FILL — BD 3 ML SYRINGE 18GX1-1/2: 18G X 1-1/2 | 30 days supply | Qty: 50 | Fill #0

## 2016-09-19 MED FILL — BD NEEDLES 30GX0.5: 30G X 1/2" | 20 days supply | Qty: 20 | Fill #0

## 2016-09-20 MED FILL — DOXYCYCLINE HYCLATE 100 MG: 100 | 20 days supply | Qty: 40 | Fill #0

## 2016-09-25 DIAGNOSIS — Z3183 Encounter for assisted reproductive fertility procedure cycle: Secondary | ICD-10-CM | POA: Diagnosis not present

## 2016-10-05 DIAGNOSIS — Z319 Encounter for procreative management, unspecified: Secondary | ICD-10-CM | POA: Diagnosis not present

## 2016-10-08 DIAGNOSIS — Z3183 Encounter for assisted reproductive fertility procedure cycle: Secondary | ICD-10-CM | POA: Diagnosis not present

## 2016-10-11 DIAGNOSIS — Z3183 Encounter for assisted reproductive fertility procedure cycle: Secondary | ICD-10-CM | POA: Diagnosis not present

## 2016-10-13 DIAGNOSIS — Z3183 Encounter for assisted reproductive fertility procedure cycle: Secondary | ICD-10-CM | POA: Diagnosis not present

## 2016-10-15 DIAGNOSIS — Z3183 Encounter for assisted reproductive fertility procedure cycle: Secondary | ICD-10-CM | POA: Diagnosis not present

## 2016-10-20 DIAGNOSIS — Z3183 Encounter for assisted reproductive fertility procedure cycle: Secondary | ICD-10-CM | POA: Diagnosis not present

## 2016-10-22 MED FILL — metFORMIN HCL 1000 MG TABS: 1000 | 30 days supply | Qty: 60 | Fill #3

## 2016-10-29 DIAGNOSIS — N801 Endometriosis of ovary: Secondary | ICD-10-CM | POA: Diagnosis not present

## 2016-10-29 DIAGNOSIS — Z3200 Encounter for pregnancy test, result unknown: Secondary | ICD-10-CM | POA: Diagnosis not present

## 2016-10-29 DIAGNOSIS — Z113 Encounter for screening for infections with a predominantly sexual mode of transmission: Secondary | ICD-10-CM | POA: Diagnosis not present

## 2016-10-29 DIAGNOSIS — E669 Obesity, unspecified: Secondary | ICD-10-CM | POA: Diagnosis not present

## 2016-10-29 DIAGNOSIS — E282 Polycystic ovarian syndrome: Secondary | ICD-10-CM | POA: Diagnosis not present

## 2016-10-31 DIAGNOSIS — Z3200 Encounter for pregnancy test, result unknown: Secondary | ICD-10-CM | POA: Diagnosis not present

## 2016-11-02 DIAGNOSIS — Z3200 Encounter for pregnancy test, result unknown: Secondary | ICD-10-CM | POA: Diagnosis not present

## 2016-11-06 MED FILL — ESTRADIOL 2 MG TABLET: 2 | 30 days supply | Qty: 60 | Fill #0

## 2016-11-06 MED FILL — METHYLPREDNISOLONE 4 MG TAB: 4 | 4 days supply | Qty: 16 | Fill #0

## 2016-11-06 MED FILL — ESTRADIOL 0.1 MG PATCH: 0.1 | 28 days supply | Qty: 8 | Fill #0

## 2016-11-08 MED FILL — ONDANSETRON HCL 4 MG TABLET: 4 | 5 days supply | Qty: 20 | Fill #0

## 2016-11-20 DIAGNOSIS — Z3183 Encounter for assisted reproductive fertility procedure cycle: Secondary | ICD-10-CM | POA: Diagnosis not present

## 2016-11-26 DIAGNOSIS — Z3183 Encounter for assisted reproductive fertility procedure cycle: Secondary | ICD-10-CM | POA: Diagnosis not present

## 2016-11-29 MED FILL — BD 3 ML SYRINGE 18GX1-1/2": 18G X 1-1/2 | 30 days supply | Qty: 50 | Fill #1

## 2016-11-29 MED FILL — BD NEEDLES 30GX0.5": 30G X 1/2" | 20 days supply | Qty: 20 | Fill #1

## 2016-11-29 MED FILL — BD NEEDLES 30GX0.5: 30G X 1/2" | 20 days supply | Qty: 20 | Fill #1

## 2016-11-29 MED FILL — ESTRADIOL 0.1 MG PATCH: 0.1 | 28 days supply | Qty: 8 | Fill #1

## 2016-11-29 MED FILL — BD NEEDLES 22GX1.5": 22G X 1-1/2 | 30 days supply | Qty: 30 | Fill #1

## 2016-11-29 MED FILL — BD 3 ML SYRINGE 18GX1-1/2: 18G X 1-1/2 | 30 days supply | Qty: 50 | Fill #1

## 2016-11-29 MED FILL — BD NEEDLES 22GX1.5: 22G X 1-1/2 | 30 days supply | Qty: 30 | Fill #1

## 2016-12-04 DIAGNOSIS — Z3200 Encounter for pregnancy test, result unknown: Secondary | ICD-10-CM | POA: Diagnosis not present

## 2016-12-06 DIAGNOSIS — Z3201 Encounter for pregnancy test, result positive: Secondary | ICD-10-CM | POA: Diagnosis not present

## 2016-12-07 MED FILL — ESTRADIOL 2 MG TABLET: 2 | 30 days supply | Qty: 60 | Fill #1

## 2016-12-11 MED FILL — metFORMIN HCL 1000 MG TABS: 1000 | 30 days supply | Qty: 60 | Fill #4

## 2016-12-18 DIAGNOSIS — Z3200 Encounter for pregnancy test, result unknown: Secondary | ICD-10-CM | POA: Diagnosis not present

## 2016-12-27 MED FILL — ESTRADIOL 0.1 MG PATCH: 0.1 | 28 days supply | Qty: 8 | Fill #2

## 2016-12-31 DIAGNOSIS — O0900 Supervision of pregnancy with history of infertility, unspecified trimester: Secondary | ICD-10-CM | POA: Diagnosis not present

## 2017-01-01 MED FILL — PROGESTERONE 200 MG CAPSULE: 200 | 14 days supply | Qty: 14 | Fill #0

## 2017-01-15 DIAGNOSIS — O09511 Supervision of elderly primigravida, first trimester: Secondary | ICD-10-CM | POA: Diagnosis not present

## 2017-01-16 ENCOUNTER — Encounter: Payer: Self-pay | Admitting: Obstetrics & Gynecology

## 2017-01-16 ENCOUNTER — Ambulatory Visit (INDEPENDENT_AMBULATORY_CARE_PROVIDER_SITE_OTHER): Payer: 59 | Admitting: Obstetrics & Gynecology

## 2017-01-16 DIAGNOSIS — Z1151 Encounter for screening for human papillomavirus (HPV): Secondary | ICD-10-CM | POA: Diagnosis not present

## 2017-01-16 DIAGNOSIS — Z124 Encounter for screening for malignant neoplasm of cervix: Secondary | ICD-10-CM | POA: Diagnosis not present

## 2017-01-16 DIAGNOSIS — O09512 Supervision of elderly primigravida, second trimester: Secondary | ICD-10-CM | POA: Diagnosis not present

## 2017-01-16 DIAGNOSIS — Z113 Encounter for screening for infections with a predominantly sexual mode of transmission: Secondary | ICD-10-CM | POA: Diagnosis not present

## 2017-01-16 LAB — TSH: TSH: 0.67 m[IU]/L

## 2017-01-16 LAB — GLUCOSE, RANDOM: Glucose, Bld: 91 mg/dL (ref 65–99)

## 2017-01-16 MED ORDER — PROMETHAZINE HCL 25 MG PO TABS: ORAL_TABLET | ORAL | 2 refills | Status: DC

## 2017-01-16 MED FILL — PROMETHAZINE 25 MG TABLET: 25 | 8 days supply | Qty: 30 | Fill #0

## 2017-01-16 NOTE — Progress Notes (Signed)
Twins by IVF

## 2017-01-17 LAB — PAIN MGMT, PROFILE 6 CONF W/O MM, U
6 Acetylmorphine: NEGATIVE ng/mL (ref <10)
Alcohol Metabolites: NEGATIVE ng/mL (ref <500)
Amphetamines: NEGATIVE ng/mL (ref <500)
Barbiturates: NEGATIVE ng/mL (ref <300)
Benzodiazepines: NEGATIVE ng/mL (ref <100)
CREATININE: 228.7 mg/dL (ref >=20.0)
Cocaine Metabolite: NEGATIVE ng/mL (ref <150)
MARIJUANA METABOLITE: NEGATIVE ng/mL (ref <20)
Methadone Metabolite: NEGATIVE ng/mL (ref <100)
OPIATES: NEGATIVE ng/mL (ref <100)
OXYCODONE: NEGATIVE ng/mL (ref <100)
Oxidant: NEGATIVE ug/mL (ref <200)
Phencyclidine: NEGATIVE ng/mL (ref <25)
Please note:: 0
pH: 6.78 (ref 4.5–9.0)

## 2017-01-17 LAB — PRENATAL PROFILE (SOLSTAS)
Antibody Screen: NEGATIVE
BASOS PCT: 0 %
Basophils Absolute: 0 cells/uL (ref 0–200)
Eosinophils Absolute: 93 cells/uL (ref 15–500)
Eosinophils Relative: 1 %
HEMATOCRIT: 39.2 % (ref 35.0–45.0)
HEMOGLOBIN: 12.6 g/dL (ref 11.7–15.5)
HEP B S AG: NEGATIVE
HIV: NONREACTIVE
LYMPHS ABS: 2139 {cells}/uL (ref 850–3900)
Lymphocytes Relative: 23 %
MCH: 26.7 pg — AB (ref 27.0–33.0)
MCHC: 32.1 g/dL (ref 32.0–36.0)
MCV: 83.1 fL (ref 80.0–100.0)
MONO ABS: 558 {cells}/uL (ref 200–950)
MPV: 9.2 fL (ref 7.5–12.5)
Monocytes Relative: 6 %
NEUTROS ABS: 6510 {cells}/uL (ref 1500–7800)
NEUTROS PCT: 70 %
Platelets: 386 10*3/uL (ref 140–400)
RBC: 4.72 MIL/uL (ref 3.80–5.10)
RDW: 16.9 % — AB (ref 11.0–15.0)
Rh Type: POSITIVE
Rubella: 21.7 Index — ABNORMAL HIGH (ref <0.90)
WBC: 9.3 10*3/uL (ref 3.8–10.8)

## 2017-01-17 LAB — HEMOGLOBIN A1C
Hgb A1c MFr Bld: 5.5 % (ref <5.7)
Mean Plasma Glucose: 111 mg/dL

## 2017-01-17 LAB — SICKLE CELL SCREEN: SICKLE CELL SCREEN: NEGATIVE

## 2017-01-17 NOTE — Progress Notes (Signed)
Subjective:    Cynthia [name] is a G65P0 [redacted]w[redacted]d being seen today for her first obstetrical visit.  Her obstetrical history is significant for AMA, obesity, Twins, IVF.   Patient does intend to breast feed. Pregnancy history fully reviewed.  Patient reports nausea and vomiting.  Vitals:   01/16/17 0944  BP: 135/79  Pulse: 82  Weight: 210 lb (95.3 kg)    HISTORY: OB History  Gravida Para Term Preterm AB Living  1            SAB TAB Ectopic Multiple Live Births               # Outcome Date GA Lbr Len/2nd Weight Sex Delivery Anes PTL Lv  1 Current              Past Medical History:  Diagnosis Date  . Anemia    hx of anemia  . Anxiety   . Depression   . Endometrioma   . Endometriosis   . Enlarged ovary    CYSTITIC LEFT SIDE  . H/O seasonal allergies   . History of anemia   . History of ovarian cyst    dermoid  . Hyperlipidemia   . Wears glasses    Past Surgical History:  Procedure Laterality Date  . HYSTEROSCOPY N/A 03/06/2016   Procedure: HYSTEROSCOPY WITH POLYPECTOMY AND D & C;  Surgeon: Governor Specking, MD;  Location: [place];  Service: Gynecology;  Laterality: N/A;  . ivf    . LAPAROSCOPIC OVARIAN CYSTECTOMY  2014, 2016  . LAPAROSCOPIC OVARIAN CYSTECTOMY Left 06/17/2015   Procedure: LAPAROSCOPIC OVARIAN CYSTECTOMY, LYSIS OF ADHESIONS;  Surgeon: Arvella Nigh, MD;  Location: [place];  Service: Gynecology;  Laterality: Left;  . LAPAROSCOPY OVARIAN CYSTECTOMY  2013   dermoid  . OVARIAN CYST REMOVAL     Family History  Problem Relation Age of Onset  . Cancer Mother     breast  . Diabetes Mother     and mother's side of family  . Hyperlipidemia Mother   . Cancer Father     thyroid  . Heart disease Maternal Uncle   . Hyperlipidemia Maternal Grandfather   . Hypertension Maternal Grandfather   . Diabetes Maternal Grandfather      Exam    Uterus:     Pelvic Exam:    Perineum: No Hemorrhoids   Vulva: normal   Vagina:   normal mucosa, normal discharge   pH: n/a   Cervix: no lesions   Adnexa: normal adnexa   Bony Pelvis: average  System: Breast:  normal appearance, no masses or tenderness   Skin: normal coloration and turgor, no rashes    Neurologic: oriented, normal mood   Extremities: no deformities   HEENT sclera clear, anicteric, oropharynx clear, no lesions, neck supple with midline trachea, thyroid without masses and trachea midline   Mouth/Teeth mucous membranes moist, pharynx normal without lesions and dental hygiene good   Neck supple and no masses   Cardiovascular: regular rate and rhythm   Respiratory:  appears well, vitals normal, no respiratory distress, acyanotic, normal RR, neck free of mass or lymphadenopathy, chest clear, no wheezing, crepitations, rhonchi, normal symmetric air entry   Abdomen: soft, non-tender; bowel sounds normal; no masses,  no organomegaly   Urinary: urethral meatus normal      Assessment:    Pregnancy: G1P0 Patient Active Problem List   Diagnosis Date Noted  . History of endometriosis 01/16/2017  . Supervision of high  risk primigravida in second trimester in patient 75 years or older at time of delivery 01/16/2017  . Pelvic adhesive disease 06/17/2015    Class: Present on Admission  . Endometrioma of ovary 06/17/2015    Class: Present on Admission  . Conjunctival cyst of both eyes 06/06/2015  . Menometrorrhagia 12/20/2014  . Anemia 12/20/2014  . Hyperlipidemia 11/22/2014  . Severe obesity (BMI >= 40) ([place]) 11/22/2014  . Anxiety state 11/22/2014        Plan:     Initial labs drawn. Prenatal vitamins. Problem list reviewed and updated. Genetic Screening discussed -->[place] and AFP  Ultrasound discussed; fetal survey: requested. 18-20 weeks with MFM  1.  PreE risk--AMA, twins, PCO-->Baby [facility] 2.  GDM risk-->obesity and PCO--early 2 hour 3.  [employer] employee product   Follow up in 3 weeks.   [name] 01/17/2017

## 2017-01-18 LAB — CYTOLOGY - PAP
Chlamydia: NEGATIVE
Diagnosis: NEGATIVE
HPV: NOT DETECTED
NEISSERIA GONORRHEA: NEGATIVE

## 2017-01-18 LAB — CULTURE, OB URINE

## 2017-01-19 ENCOUNTER — Other Ambulatory Visit: Payer: Self-pay | Admitting: Obstetrics & Gynecology

## 2017-01-19 ENCOUNTER — Encounter: Payer: Self-pay | Admitting: Obstetrics & Gynecology

## 2017-01-19 DIAGNOSIS — R8271 Bacteriuria: Secondary | ICD-10-CM | POA: Insufficient documentation

## 2017-01-19 MED ORDER — PENICILLIN V POTASSIUM 500 MG PO TABS: 500.0000 mg | ORAL_TABLET | Freq: Four times a day (QID) | ORAL | 0 refills | Status: DC

## 2017-01-19 NOTE — Progress Notes (Signed)
GBs Bacteruria.  Treat with PEN V K for 7 days.  RN to call patient.

## 2017-01-21 MED FILL — PENICILLIN VK 500 MG TABLET: 500 | 7 days supply | Qty: 28 | Fill #0

## 2017-01-22 ENCOUNTER — Other Ambulatory Visit: Payer: Self-pay

## 2017-01-22 ENCOUNTER — Telehealth: Payer: Self-pay | Admitting: *Deleted

## 2017-01-22 DIAGNOSIS — O99210 Obesity complicating pregnancy, unspecified trimester: Secondary | ICD-10-CM | POA: Diagnosis not present

## 2017-01-22 NOTE — Telephone Encounter (Signed)
-----   Message from Guss Bunde, MD sent at 01/19/2017  3:23 PM EST ----- GBS Bacteruria.  Treat with PEN V K for 7 days.  RN to call patient.

## 2017-01-22 NOTE — Telephone Encounter (Signed)
Pt notified of urine results and RX for Pen V K was sent to her pharmacy per Dr Gala Romney.

## 2017-01-23 ENCOUNTER — Telehealth: Payer: Self-pay | Admitting: *Deleted

## 2017-01-23 ENCOUNTER — Encounter: Payer: Self-pay | Admitting: *Deleted

## 2017-01-23 DIAGNOSIS — O24419 Gestational diabetes mellitus in pregnancy, unspecified control: Secondary | ICD-10-CM

## 2017-01-23 LAB — GLUCOSE TOLERANCE, 2 HOURS W/ 1HR
GLUCOSE, 2 HOUR: 151 mg/dL — AB (ref <140)
Glucose, 1 hour: 191 mg/dL
Glucose, Fasting: 84 mg/dL (ref 65–99)

## 2017-01-23 NOTE — Telephone Encounter (Signed)
Pt notified of abnormal early 2 hr GTT.  Referral made to N & D for consultation and meter testing.

## 2017-01-24 ENCOUNTER — Other Ambulatory Visit: Payer: Self-pay | Admitting: *Deleted

## 2017-01-26 LAB — CFVANTAGE CYSTIC FIB EXP SCREEN

## 2017-01-27 ENCOUNTER — Inpatient Hospital Stay (HOSPITAL_COMMUNITY)
Admission: AD | Admit: 2017-01-27 | Discharge: 2017-01-27 | Disposition: A | Discharge status: Home / Self Care | Payer: 59 | Source: Ambulatory Visit | Attending: Obstetrics & Gynecology | Admitting: Obstetrics & Gynecology

## 2017-01-27 ENCOUNTER — Encounter (HOSPITAL_COMMUNITY): Payer: Self-pay | Admitting: *Deleted

## 2017-01-27 ENCOUNTER — Inpatient Hospital Stay (HOSPITAL_COMMUNITY): Payer: 59

## 2017-01-27 DIAGNOSIS — O24415 Gestational diabetes mellitus in pregnancy, controlled by oral hypoglycemic drugs: Secondary | ICD-10-CM | POA: Insufficient documentation

## 2017-01-27 DIAGNOSIS — Z3A11 11 weeks gestation of pregnancy: Secondary | ICD-10-CM | POA: Diagnosis not present

## 2017-01-27 DIAGNOSIS — Z79899 Other long term (current) drug therapy: Secondary | ICD-10-CM | POA: Diagnosis not present

## 2017-01-27 DIAGNOSIS — O09511 Supervision of elderly primigravida, first trimester: Secondary | ICD-10-CM | POA: Insufficient documentation

## 2017-01-27 DIAGNOSIS — O468X1 Other antepartum hemorrhage, first trimester: Secondary | ICD-10-CM

## 2017-01-27 DIAGNOSIS — O208 Other hemorrhage in early pregnancy: Secondary | ICD-10-CM | POA: Insufficient documentation

## 2017-01-27 DIAGNOSIS — O418X12 Other specified disorders of amniotic fluid and membranes, first trimester, fetus 2: Secondary | ICD-10-CM

## 2017-01-27 DIAGNOSIS — O09811 Supervision of pregnancy resulting from assisted reproductive technology, first trimester: Secondary | ICD-10-CM | POA: Insufficient documentation

## 2017-01-27 DIAGNOSIS — Z87891 Personal history of nicotine dependence: Secondary | ICD-10-CM | POA: Insufficient documentation

## 2017-01-27 DIAGNOSIS — O30041 Twin pregnancy, dichorionic/diamniotic, first trimester: Secondary | ICD-10-CM | POA: Diagnosis not present

## 2017-01-27 DIAGNOSIS — Z7984 Long term (current) use of oral hypoglycemic drugs: Secondary | ICD-10-CM | POA: Insufficient documentation

## 2017-01-27 DIAGNOSIS — O4690 Antepartum hemorrhage, unspecified, unspecified trimester: Secondary | ICD-10-CM

## 2017-01-27 DIAGNOSIS — O209 Hemorrhage in early pregnancy, unspecified: Secondary | ICD-10-CM | POA: Insufficient documentation

## 2017-01-27 DIAGNOSIS — N939 Abnormal uterine and vaginal bleeding, unspecified: Secondary | ICD-10-CM | POA: Diagnosis present

## 2017-01-27 DIAGNOSIS — O30001 Twin pregnancy, unspecified number of placenta and unspecified number of amniotic sacs, first trimester: Secondary | ICD-10-CM | POA: Diagnosis not present

## 2017-01-27 HISTORY — DX: Gestational diabetes mellitus in pregnancy, unspecified control: O24.419

## 2017-01-27 LAB — URINALYSIS, ROUTINE W REFLEX MICROSCOPIC
Bilirubin Urine: NEGATIVE
GLUCOSE, UA: NEGATIVE mg/dL
Ketones, ur: NEGATIVE mg/dL
Nitrite: NEGATIVE
Protein, ur: 30 mg/dL — AB
SPECIFIC GRAVITY, URINE: 1.025 (ref 1.005–1.030)
pH: 6 (ref 5.0–8.0)

## 2017-01-27 LAB — WET PREP, GENITAL
Clue Cells Wet Prep HPF POC: NONE SEEN
SPERM: NONE SEEN
Trich, Wet Prep: NONE SEEN
YEAST WET PREP: NONE SEEN

## 2017-01-27 LAB — URINALYSIS, MICROSCOPIC (REFLEX)

## 2017-01-27 NOTE — Discharge Instructions (Signed)
Subchorionic Hematoma A subchorionic hematoma is a gathering of blood between the outer wall of the placenta and the inner wall of the womb (uterus). The placenta is the organ that connects the fetus to the wall of the uterus. The placenta performs the feeding, breathing (oxygen to the fetus), and waste removal (excretory work) of the fetus.  Subchorionic hematoma is the most common abnormality found on a result from ultrasonography done during the first trimester or early second trimester of pregnancy. If there has been little or no vaginal bleeding, early small hematomas usually shrink on their own and do not affect your baby or pregnancy. The blood is gradually absorbed over 1-2 weeks. When bleeding starts later in pregnancy or the hematoma is larger or occurs in an older pregnant woman, the outcome may not be as good. Larger hematomas may get bigger, which increases the chances for miscarriage. Subchorionic hematoma also increases the risk of premature detachment of the placenta from the uterus, preterm (premature) labor, and stillbirth. HOME CARE INSTRUCTIONS  Stay on bed rest if your health care provider recommends this. Although bed rest will not prevent more bleeding or prevent a miscarriage, your health care provider may recommend bed rest until you are advised otherwise.  Avoid heavy lifting (more than 10 lb [4.5 kg]), exercise, sexual intercourse, or douching as directed by your health care provider.  Keep track of the number of pads you use each day and how soaked (saturated) they are. Write down this information.  Do not use tampons.  Keep all follow-up appointments as directed by your health care provider. Your health care provider may ask you to have follow-up blood tests or ultrasound tests or both. SEEK IMMEDIATE MEDICAL CARE IF:  You have severe cramps in your stomach, back, abdomen, or pelvis.  You have a fever.  You pass large clots or tissue. Save any tissue for your health  care provider to look at.  Your bleeding increases or you become lightheaded, feel weak, or have fainting episodes. This information is not intended to replace advice given to you by your health care provider. Make sure you discuss any questions you have with your health care provider. Document Released: 03/13/2007 Document Revised: 12/17/2014 Document Reviewed: 06/25/2013 Elsevier Interactive Patient Education  2017 [employer].

## 2017-01-27 NOTE — MAU Provider Note (Signed)
Chief Complaint: Vaginal Bleeding   SUBJECTIVE HPI: Cynthia [name] is a 36 y.o. G1P0 at [redacted]w[redacted]d who presents to [facility] Admissions reporting vaginal bleeding and cramping.  Patient noticed this morning when she woke up after going the bathroom and wiping, right red blood on the toilet paper. She also knows that she's been having some mild cramping, like a period. She denies any passage of tissue or clots. This is an IVF pregnancy with confirmed twin IUP gestation. Denies any recent intercourse. Denies any abnormal vaginal discharge. Currently being treated for GBS bacturia found in office labs at her initial prenatal visit last week.  Pregnancy Complications: [id number] (failed early 2 hour GTT); AMA; IVF pregnancy  Past Medical History:  Diagnosis Date  . Anemia    hx of anemia  . Anxiety   . Depression   . Endometrioma   . Endometriosis   . Enlarged ovary    CYSTITIC LEFT SIDE  . Gestational diabetes   . H/O seasonal allergies   . History of anemia   . History of ovarian cyst    dermoid  . Hyperlipidemia   . Wears glasses    OB History  Gravida Para Term Preterm AB Living  1            SAB TAB Ectopic Multiple Live Births               # Outcome Date GA Lbr Len/2nd Weight Sex Delivery Anes PTL Lv  1 Current              Past Surgical History:  Procedure Laterality Date  . HYSTEROSCOPY N/A 03/06/2016   Procedure: HYSTEROSCOPY WITH POLYPECTOMY AND D & C;  Surgeon: Governor Specking, MD;  Location: [facility];  Service: Gynecology;  Laterality: N/A;  . ivf    . LAPAROSCOPIC OVARIAN CYSTECTOMY  2014, 2016  . LAPAROSCOPIC OVARIAN CYSTECTOMY Left 06/17/2015   Procedure: LAPAROSCOPIC OVARIAN CYSTECTOMY, LYSIS OF ADHESIONS;  Surgeon: Arvella Nigh, MD;  Location: [place];  Service: Gynecology;  Laterality: Left;  . LAPAROSCOPY OVARIAN CYSTECTOMY  2013   dermoid  . OVARIAN CYST REMOVAL     Social History   Social History  . Marital status:  Married    Spouse name: N/A  . Number of children: N/A  . Years of education: N/A   Occupational History  . homemaker    Social History Main Topics  . Smoking status: Former Smoker    Packs/day: 1.00    Years: 10.00    Types: Cigarettes    Quit date: 11/23/2011  . Smokeless tobacco: Never Used  . Alcohol use Yes     Comment: rare  . Drug use: No  . Sexual activity: Yes    Partners: Male    Birth control/ protection: None   Other Topics Concern  . Not on file   Social History Narrative   ** Merged History Encounter **       No current facility-administered medications on file prior to encounter.    Current Outpatient Prescriptions on File Prior to Encounter  Medication Sig Dispense Refill  . acetaminophen (TYLENOL) 325 MG tablet Take 650 mg by mouth as needed.    . diphenhydrAMINE (BENADRYL) 25 MG tablet Take 25 mg by mouth every 6 (six) hours as needed for sleep.    . diphenhydrAMINE (SOMINEX) 25 MG tablet Take 25 mg by mouth as needed for allergies.    . metFORMIN (GLUCOPHAGE) 1000 MG tablet Take  1,000 mg by mouth at bedtime.     . ondansetron (ZOFRAN) 4 MG tablet Take 4 mg by mouth as needed for nausea or vomiting.   1  . penicillin v potassium (VEETID) 500 MG tablet Take 1 tablet (500 mg total) by mouth 4 (four) times daily. 28 tablet 0  . prenatal vitamin w/FE, FA (PRENATAL 1 + 1) 27-1 MG TABS tablet Take 1 tablet by mouth daily at 12 noon.     . progesterone (PROMETRIUM) 200 MG capsule Take 200 mg by mouth daily.   0  . promethazine (PHENERGAN) 25 MG tablet Take 1/2 to 1 tablet every 6 hours as needed 30 tablet 2   No Known Allergies  I have reviewed the past Medical Hx, Surgical Hx, Social Hx, Allergies and Medications.   REVIEW OF SYSTEMS  RESPIRATORY: no cough, shortness of breath, or wheezing CARDIOVASCULAR: no chest pain or dyspnea on exertion GASTROINTESTINAL: no abdominal pain, change in bowel habits, or black or bloody stools GENITO-URINARY: no  dysuria, trouble voiding, or hematuria negative for - genital discharge, vulvar/vaginal symptoms. +VAGINAL BLEEDING; +CRAMPING MUSKULOSKELETAL: negative for - gait disturbance or swelling in ankle - bilateral, foot - bilateral and leg - bilateral NEUROLOGICAL: negative for - dizziness, gait disturbance, headaches, numbness/tingling or visual changes DERMATOLOGICAL: negative   OBJECTIVE Patient Vitals for the past 24 hrs:  BP Temp Temp src Pulse Resp SpO2 Height Weight  01/27/17 1200 147/88 98 F (36.7 C) Oral 90 18 99 % 5\' 2"  (1.575 m) 210 lb 8 oz (95.5 kg)    PHYSICAL EXAM Constitutional: Well-developed, well-nourished female in no acute distress.  Cardiovascular: normal rate Respiratory: normal rate and effort.  GI: Abd soft, non-tender, non-distended. Pos BS x 4 MS: Extremities nontender, no edema, normal ROM Neurologic: Alert and oriented x 4.  GU: Neg CVAT. SPECULUM EXAM: NEFG, dark red-brown blood noted on vaginal mucosa and cervix and in os, no blood clots, no active bleeding from cervical os. BIMANUAL: cervix closed; uterus normal size for gestational age, no adnexal tenderness or masses, no pelvic tenderness on palpation. No CMT.  LAB RESULTS Results for orders placed or performed during the hospital encounter of 01/27/17 (from the past 24 hour(s))  Urinalysis, Routine w reflex microscopic     Status: Abnormal   Collection Time: 01/27/17 12:00 PM  Result Value Ref Range   Color, Urine AMBER (A) YELLOW   APPearance CLOUDY (A) CLEAR   Specific Gravity, Urine 1.025 1.005 - 1.030   pH 6.0 5.0 - 8.0   Glucose, UA NEGATIVE NEGATIVE mg/dL   Hgb urine dipstick LARGE (A) NEGATIVE   Bilirubin Urine NEGATIVE NEGATIVE   Ketones, ur NEGATIVE NEGATIVE mg/dL   Protein, ur 30 (A) NEGATIVE mg/dL   Nitrite NEGATIVE NEGATIVE   Leukocytes, UA TRACE (A) NEGATIVE  Urinalysis, Microscopic (reflex)     Status: Abnormal   Collection Time: 01/27/17 12:00 PM  Result Value Ref Range   RBC /  HPF TOO NUMEROUS TO COUNT 0 - 5 RBC/hpf   WBC, UA 0-5 0 - 5 WBC/hpf   Bacteria, UA RARE (A) NONE SEEN   Squamous Epithelial / LPF 0-5 (A) NONE SEEN    IMAGING US Ob Comp Less 14 Wks  Result Date: 01/27/2017 CLINICAL DATA:  36 year old pregnant female with vaginal bleeding. EXAM: TWIN OBSTETRIC <14WK [place] AND TRANSVAGINAL OB US COMPARISON:  No priors. FINDINGS: Number of IUPs:  2 Chorionicity/Amnionicity:  Dichorionic diamniotic. TWIN 1 Yolk sac:  None Embryo:  Present Cardiac Activity:  Present Heart Rate: 167 bpm CRL:  52.9  mm   11 w 6 d                  [place] EDC: TWIN 2 Yolk sac:  None Embryo:  Present Cardiac Activity: Present Heart Rate: 164 bpm CRL:  51.5  mm   11 w 5 d                  [place] EDC: Subchorionic hemorrhage: Moderate volume of hypoechoic material adjacent to the gestational sacs, compatible with a moderate subchorionic hemorrhage. This measures up to 3.6 x 1.2 x 2.5 cm. Maternal uterus/adnexae: Right ovary is normal in appearance. Left ovary is not visualized. No significant volume of free fluid in the cul-de-sac. IMPRESSION: 1. Dichorionic diamniotic twin pregnancy with 2 viable fetuses with estimated gestational ages of 15 weeks and 5-6 days and normal fetal heart rate of 167 and 164 beats per minute. 2. Moderate subchorionic hemorrhage. Electronically Signed   By: Vinnie Langton M.D.   On: 01/27/2017 13:12   US Ob Comp Addl Gest Less 14 Wks  Result Date: 01/27/2017 CLINICAL DATA:  36 year old pregnant female with vaginal bleeding. EXAM: TWIN OBSTETRIC <14WK [place] AND TRANSVAGINAL OB US COMPARISON:  No priors. FINDINGS: Number of IUPs:  2 Chorionicity/Amnionicity:  Dichorionic diamniotic. TWIN 1 Yolk sac:  None Embryo:  Present Cardiac Activity: Present Heart Rate: 167 bpm CRL:  52.9  mm   11 w 6 d                  [place] EDC: TWIN 2 Yolk sac:  None Embryo:  Present Cardiac Activity: Present Heart Rate: 164 bpm CRL:  51.5  mm   11 w 5 d                  [place] EDC: Subchorionic hemorrhage: Moderate  volume of hypoechoic material adjacent to the gestational sacs, compatible with a moderate subchorionic hemorrhage. This measures up to 3.6 x 1.2 x 2.5 cm. Maternal uterus/adnexae: Right ovary is normal in appearance. Left ovary is not visualized. No significant volume of free fluid in the cul-de-sac. IMPRESSION: 1. Dichorionic diamniotic twin pregnancy with 2 viable fetuses with estimated gestational ages of 63 weeks and 5-6 days and normal fetal heart rate of 167 and 164 beats per minute. 2. Moderate subchorionic hemorrhage. Electronically Signed   By: Vinnie Langton M.D.   On: 01/27/2017 13:12    MAU COURSE SSE SVE GC/CT - pending Wet Prep TVUS -   IUP twin gestation with cardiac activity x2 with mod subchorionic hemorrhage  MDM Plan of care reviewed with patient, including labs and tests ordered and medical treatment. Discussed signs and symptoms of concern to return to MAU. Patient has appointment coming up in 1-1/2 weeks. Discussed with patient results of ultrasound and pelvic exam findings. Showed patient ultrasound results.    ASSESSMENT 1. Subchorionic hemorrhage of placenta in first trimester, fetus 2 of multiple gestation   2. Vaginal bleeding in pregnancy   3. Vaginal bleeding in pregnancy, first trimester     PLAN Discharge home in stable condition. Bleeding precautions given. Follow up with OB provider.   Follow-up [facility] for [facility]. Go on 02/05/2017.   Specialty:  Obstetrics and Gynecology Why:  Follow up OB visit, call sooner if concerned with worsening symptoms or return to MAU/ED if worsening symptoms Contact information: [place] [phone]  Allergies as of 01/27/2017   No Known Allergies     Medication List    TAKE these medications   acetaminophen 325 MG tablet Commonly known as:  TYLENOL Take 650 mg by mouth as needed.   diphenhydrAMINE 25 MG tablet Commonly  known as:  SOMINEX Take 25 mg by mouth as needed for allergies.   diphenhydrAMINE 25 MG tablet Commonly known as:  BENADRYL Take 25 mg by mouth every 6 (six) hours as needed for sleep.   metFORMIN 1000 MG tablet Commonly known as:  GLUCOPHAGE Take 1,000 mg by mouth at bedtime.   ondansetron 4 MG tablet Commonly known as:  ZOFRAN Take 4 mg by mouth as needed for nausea or vomiting.   penicillin v potassium 500 MG tablet Commonly known as:  VEETID Take 1 tablet (500 mg total) by mouth 4 (four) times daily.   prenatal vitamin w/FE, FA 27-1 MG Tabs tablet Take 1 tablet by mouth daily at 12 noon.   progesterone 200 MG capsule Commonly known as:  PROMETRIUM Take 200 mg by mouth daily.   promethazine 25 MG tablet Commonly known as:  PHENERGAN Take 1/2 to 1 tablet every 6 hours as needed        Katherine Basset, DO OB Fellow 01/27/2017 1:36 PM

## 2017-01-27 NOTE — MAU Note (Signed)
Pt c/o vaginal bleeding that started this morning. Pt states she noted bright red blood when she wiped but none in the toilet. Pt states she had some cramping that started this morning that has been intermittant.

## 2017-01-28 LAB — GC/CHLAMYDIA PROBE AMP (~~LOC~~) NOT AT ARMC
CHLAMYDIA, DNA PROBE: NEGATIVE
Neisseria Gonorrhea: NEGATIVE

## 2017-01-30 ENCOUNTER — Ambulatory Visit (INDEPENDENT_AMBULATORY_CARE_PROVIDER_SITE_OTHER): Payer: 59 | Admitting: Obstetrics & Gynecology

## 2017-01-30 VITALS — BP 124/85 | HR 97 | Wt 207.0 lb

## 2017-01-30 DIAGNOSIS — E669 Obesity, unspecified: Secondary | ICD-10-CM

## 2017-01-30 DIAGNOSIS — O99210 Obesity complicating pregnancy, unspecified trimester: Secondary | ICD-10-CM

## 2017-01-30 LAB — GLUCOSE, POCT (MANUAL RESULT ENTRY): POC GLUCOSE: 92 mg/dL (ref 70–99)

## 2017-01-30 NOTE — Progress Notes (Signed)
Has subchorionic hemorrhage noted on last U/S    PRENATAL VISIT NOTE  Subjective:  Cynthia [name] is a 36 y.o. G1P0 at [redacted]w[redacted]d being seen today for ongoing prenatal care.  She is currently monitored for the following issues for this high-risk pregnancy and has Hyperlipidemia; Severe obesity (BMI >= 40) ([place] Anxiety state; Menometrorrhagia; Anemia; Conjunctival cyst of both eyes; Pelvic adhesive disease; Endometrioma of ovary; History of endometriosis; Supervision of high risk primigravida in second trimester in patient 44 years or older at time of delivery; GBS bacteriuria; and Gestational diabetes on her problem list.  Patient reports vaginal bleding a few days ago.  Diagnosed with subchorionic hemorrhage by [place] in MAU.  No bleeding last 2 days..   Krista Blue. Bleeding: Scant.   . Denies leaking of fluid.   The following portions of the patient's history were reviewed and updated as appropriate: allergies, current medications, past family history, past medical history, past social history, past surgical history and problem list. Problem list updated.  Objective:   Vitals:   01/30/17 1057  BP: 124/85  Pulse: 97  Weight: 207 lb (93.9 kg)    Fetal Status:           General:  Alert, oriented and cooperative. Patient is in no acute distress.  Skin: Skin is warm and dry. No rash noted.   Cardiovascular: Normal heart rate noted  Respiratory: Normal respiratory effort, no problems with respiration noted  Abdomen: Soft, gravid, appropriate for gestational age.       Pelvic:  Cervical exam deferred        Extremities: Normal range of motion.     Mental Status: Normal mood and affect. Normal behavior. Normal judgment and thought content.   Assessment and Plan:  Pregnancy: G1P0 at [redacted]w[redacted]d  1. Obesity in pregnancy -Early diabetes, has appt with nutrition in a few days.   - POCT Glucose (CBG)--92 after banana  2.  NIPS nml; needs AFP at 15-20 weeks.  3.  Bleeding precautions given.  Preterm  labor symptoms and general obstetric precautions including but not limited to vaginal bleeding, contractions, leaking of fluid and fetal movement were reviewed in detail with the patient. Please refer to After Visit Summary for other counseling recommendations.  Return in about 10 days (around 02/09/2017).   Guss Bunde, MD

## 2017-02-04 ENCOUNTER — Telehealth: Payer: Self-pay | Admitting: *Deleted

## 2017-02-04 ENCOUNTER — Encounter: Payer: 59 | Attending: Obstetrics & Gynecology

## 2017-02-04 ENCOUNTER — Encounter: Payer: Self-pay | Admitting: Obstetrics & Gynecology

## 2017-02-04 DIAGNOSIS — O24419 Gestational diabetes mellitus in pregnancy, unspecified control: Secondary | ICD-10-CM

## 2017-02-04 DIAGNOSIS — Z713 Dietary counseling and surveillance: Secondary | ICD-10-CM | POA: Insufficient documentation

## 2017-02-04 MED ORDER — GLUCOSE BLOOD VI STRP: ORAL_STRIP | 12 refills | Status: DC

## 2017-02-04 MED ORDER — FREESTYLE LANCETS MISC: 12 refills | Status: DC

## 2017-02-04 MED FILL — FREESTYLE LITE METER: 30 days supply | Qty: 1 | Fill #0

## 2017-02-04 MED FILL — FREESTYLE LANCETS: 25 days supply | Qty: 100 | Fill #0

## 2017-02-04 MED FILL — FREESTYLE LITE TEST STRIP: 25 days supply | Qty: 100 | Fill #0

## 2017-02-04 NOTE — Progress Notes (Signed)
  Patient was seen on 02/04/17 for Gestational Diabetes self-management class at [facility]. The following learning objectives were met by the patient during this course:   States the definition of Gestational Diabetes  States why dietary management is important in controlling blood glucose  Describes the effects each nutrient has on blood glucose levels  Demonstrates ability to create a balanced meal plan  Demonstrates carbohydrate counting   States when to check blood glucose levels  Demonstrates proper blood glucose monitoring techniques  States the effect of stress and exercise on blood glucose levels  States the importance of limiting caffeine and abstaining from alcohol and smoking  Blood glucose monitor given: yes  Lot # [id number] Exp: 09/08/17 Blood glucose reading: 84  Patient instructed to monitor glucose levels: FBS: 60 - <90 1 hour: <140 2 hour: <120  *Patient received handouts:  Nutrition Diabetes and Pregnancy  Carbohydrate Counting List  Patient will be seen for follow-up as needed.

## 2017-02-04 NOTE — Telephone Encounter (Signed)
RX for Contour Next strips and lancets sent to [place]

## 2017-02-05 ENCOUNTER — Encounter: Payer: Self-pay | Admitting: Obstetrics & Gynecology

## 2017-02-06 ENCOUNTER — Encounter: Payer: Self-pay | Admitting: Obstetrics & Gynecology

## 2017-02-11 ENCOUNTER — Ambulatory Visit (INDEPENDENT_AMBULATORY_CARE_PROVIDER_SITE_OTHER): Payer: 59 | Admitting: Obstetrics & Gynecology

## 2017-02-11 VITALS — BP 122/86 | HR 83 | Wt 208.0 lb

## 2017-02-11 DIAGNOSIS — O24410 Gestational diabetes mellitus in pregnancy, diet controlled: Secondary | ICD-10-CM

## 2017-02-11 DIAGNOSIS — O09512 Supervision of elderly primigravida, second trimester: Secondary | ICD-10-CM

## 2017-02-11 NOTE — Progress Notes (Addendum)
Pt had some dark brown spotting on Saturday when she wiped but, it has started to go away    PRENATAL VISIT NOTE  Subjective:  Cynthia [name] is a 36 y.o. G1P0 at [redacted]w[redacted]d being seen today for ongoing prenatal care.  She is currently monitored for the following issues for this high-risk pregnancy and has Hyperlipidemia; Severe obesity (BMI >= 40) ([place]); Anxiety state; Menometrorrhagia; Anemia; Conjunctival cyst of both eyes; Pelvic adhesive disease; Endometrioma of ovary; History of endometriosis; Supervision of high risk primigravida in second trimester in patient 26 years or older at time of delivery; GBS bacteriuria; and Gestational diabetes on her problem list.  Patient reports no complaints.  Contractions: Not present. Vag. Bleeding: Scant.  Movement: Absent. Denies leaking of fluid.   The following portions of the patient's history were reviewed and updated as appropriate: allergies, current medications, past family history, past medical history, past social history, past surgical history and problem list. Problem list updated.  Objective:   Vitals:   02/11/17 1606  BP: 122/86  Pulse: 83  Weight: 208 lb (94.3 kg)    Fetal Status: Fetal Heart Rate (bpm): 152/160   Movement: Absent     General:  Alert, oriented and cooperative. Patient is in no acute distress.  Skin: Skin is warm and dry. No rash noted.   Cardiovascular: Normal heart rate noted  Respiratory: Normal respiratory effort, no problems with respiration noted  Abdomen: Soft, gravid, appropriate for gestational age. Pain/Pressure: Absent     Pelvic:  Cervical exam deferred        Extremities: Normal range of motion.  Edema: None  Mental Status: Normal mood and affect. Normal behavior. Normal judgment and thought content.   Assessment and Plan:  Pregnancy: G1P0 at [redacted]w[redacted]d  1.  Anatomy US ordered.  AFP next visit  2.  Gestational diabetes:  Metformin was causing vomiting and abdominal pain. Asian self stop the medication  this past week since stopping her fastings have been in the 80s and 1 at 95. Her postprandials all been normal. At this point is fine that she not take the metformin. If she does develop hyperglycemia we can try glyburide.  There are no diagnoses linked to this encounter. Preterm labor symptoms and general obstetric precautions including but not limited to vaginal bleeding, contractions, leaking of fluid and fetal movement were reviewed in detail with the patient. Please refer to After Visit Summary for other counseling recommendations.   RTC 3 weeks.  Call with hyperglycemia.  Start 81 mg asa  Guss Bunde, MD

## 2017-02-11 NOTE — Addendum Note (Signed)
Addended by: Lyndal Rainbow on: 02/11/2017 05:21 PM   Modules accepted: Orders

## 2017-02-12 NOTE — Addendum Note (Signed)
Addended by: Lyndal Rainbow on: 02/12/2017 01:05 PM   Modules accepted: Orders

## 2017-02-15 ENCOUNTER — Encounter (HOSPITAL_COMMUNITY): Payer: Self-pay | Admitting: Obstetrics & Gynecology

## 2017-02-20 DIAGNOSIS — H5213 Myopia, bilateral: Secondary | ICD-10-CM | POA: Diagnosis not present

## 2017-02-20 DIAGNOSIS — E119 Type 2 diabetes mellitus without complications: Secondary | ICD-10-CM | POA: Diagnosis not present

## 2017-02-20 LAB — HM DIABETES EYE EXAM

## 2017-02-21 ENCOUNTER — Ambulatory Visit (HOSPITAL_COMMUNITY)
Admission: RE | Admit: 2017-02-21 | Discharge: 2017-02-21 | Disposition: A | Discharge status: Home / Self Care | Payer: 59 | Source: Ambulatory Visit | Attending: Obstetrics & Gynecology | Admitting: Obstetrics & Gynecology

## 2017-02-21 ENCOUNTER — Other Ambulatory Visit: Payer: Self-pay | Admitting: Obstetrics & Gynecology

## 2017-02-21 ENCOUNTER — Encounter (HOSPITAL_COMMUNITY): Payer: Self-pay

## 2017-02-21 ENCOUNTER — Other Ambulatory Visit (HOSPITAL_COMMUNITY): Payer: Self-pay | Admitting: *Deleted

## 2017-02-21 DIAGNOSIS — O09512 Supervision of elderly primigravida, second trimester: Secondary | ICD-10-CM

## 2017-02-21 DIAGNOSIS — O30042 Twin pregnancy, dichorionic/diamniotic, second trimester: Secondary | ICD-10-CM

## 2017-02-21 DIAGNOSIS — Z3A15 15 weeks gestation of pregnancy: Secondary | ICD-10-CM | POA: Diagnosis not present

## 2017-02-21 DIAGNOSIS — O09812 Supervision of pregnancy resulting from assisted reproductive technology, second trimester: Secondary | ICD-10-CM | POA: Diagnosis not present

## 2017-02-21 DIAGNOSIS — O99212 Obesity complicating pregnancy, second trimester: Secondary | ICD-10-CM | POA: Diagnosis not present

## 2017-02-21 DIAGNOSIS — O24410 Gestational diabetes mellitus in pregnancy, diet controlled: Secondary | ICD-10-CM | POA: Insufficient documentation

## 2017-02-26 ENCOUNTER — Encounter: Payer: Self-pay | Admitting: General Practice

## 2017-03-05 MED FILL — FREESTYLE LITE TEST STRIP: 25 days supply | Qty: 100 | Fill #1

## 2017-03-06 ENCOUNTER — Ambulatory Visit (INDEPENDENT_AMBULATORY_CARE_PROVIDER_SITE_OTHER): Payer: 59 | Admitting: Obstetrics & Gynecology

## 2017-03-06 VITALS — BP 132/86 | HR 93 | Wt 211.0 lb

## 2017-03-06 DIAGNOSIS — O09512 Supervision of elderly primigravida, second trimester: Secondary | ICD-10-CM

## 2017-03-06 DIAGNOSIS — O30002 Twin pregnancy, unspecified number of placenta and unspecified number of amniotic sacs, second trimester: Secondary | ICD-10-CM | POA: Diagnosis not present

## 2017-03-06 DIAGNOSIS — O24410 Gestational diabetes mellitus in pregnancy, diet controlled: Secondary | ICD-10-CM

## 2017-03-06 NOTE — Progress Notes (Addendum)
    PRENATAL VISIT NOTE  Subjective:  Cynthia [name] is a 36 y.o. G1P0 at [redacted]w[redacted]d being seen today for ongoing prenatal care.  She is currently monitored for the following issues for this high-risk pregnancy and has Hyperlipidemia; Severe obesity (BMI >= 40) ([place]); Anxiety state; Menometrorrhagia; Anemia; Conjunctival cyst of both eyes; Pelvic adhesive disease; Endometrioma of ovary; History of endometriosis; Supervision of high risk primigravida in second trimester in patient 22 years or older at time of delivery; GBS bacteriuria; and Gestational diabetes on her problem list.  Patient reports headache.  Contractions: Not present. Vag. Bleeding: None.  Movement: Absent. Denies leaking of fluid.   The following portions of the patient's history were reviewed and updated as appropriate: allergies, current medications, past family history, past medical history, past social history, past surgical history and problem list. Problem list updated.  Objective:   Vitals:   03/06/17 1443  BP: 132/86  Pulse: 93  Weight: 211 lb (95.7 kg)    Fetal Status: Fetal Heart Rate (bpm): 150/162   Movement: Absent     General:  Alert, oriented and cooperative. Patient is in no acute distress.  Skin: Skin is warm and dry. No rash noted.   Cardiovascular: Normal heart rate noted  Respiratory: Normal respiratory effort, no problems with respiration noted  Abdomen: Soft, gravid, appropriate for gestational age. Pain/Pressure: Present     Pelvic:  Cervical exam deferred        Extremities: Normal range of motion.  Edema: None  Mental Status: Normal mood and affect. Normal behavior. Normal judgment and thought content.   Assessment and Plan:  Pregnancy: G1P0 at [redacted]w[redacted]d  1. Twin gestation in second trimester, unspecified multiple gestation type -Anatomy US ordered, will look at cervical length   2. Supervision of high risk primigravida in second trimester in patient 35 years or older at time of delivery -  Alpha fetoprotein, maternal (NIPS nml)  3. Diet controlled gestational diabetes mellitus (GDM) in second trimester Fasting 80-90's PP lunch 80's dinner 169 (highest) usually 95-112 Pt will bring log next visit.  4.  Headaches Magnesium and B2 (suggested by husband neurologist, massage, peppermint oil, biofreeeze.  Pt stopped caffeine this pregnancy.  Wakes up with a.m. Headache.  Will drink cup of tea in a.m. And see if this helps headache.  Might be migraine if responds.  Some nasal stuffiness present.  Suggest Zyrtec.  Benadryl approved for sleep.  Pt denies sleep apnea.  5.  BP high normal Retaken at rest (first taken when patient hurried in from parking lot)--120s/80s.  Pt will take BP prn at home.   Preterm labor symptoms and general obstetric precautions including but not limited to vaginal bleeding, contractions, leaking of fluid and fetal movement were reviewed in detail with the patient. Please refer to After Visit Summary for other counseling recommendations.  Return in about 3 weeks (around 03/27/2017).   Guss Bunde, MD

## 2017-03-11 LAB — ALPHA FETOPROTEIN, MATERNAL
AFP: 50.9 ng/mL
CURR GEST AGE: 17 wk
MOM FOR AFP: 1.7
Open Spina bifida: NEGATIVE

## 2017-03-18 ENCOUNTER — Telehealth: Payer: Self-pay

## 2017-03-18 NOTE — Telephone Encounter (Signed)
MD reviewed CBG from last 7 days.  There are few elevated pp lunch and dinner.  A few missed readings.  No need to intervene at this time.  Next appt is 03/27/17.  Will continue to monitor remotely as well as see patient in 1 week.

## 2017-03-18 NOTE — Telephone Encounter (Signed)
Received critical high blood sugars reading  email over the weekend from baby [facility]. Notified Dr.Leggett sheet has been printed.

## 2017-03-21 ENCOUNTER — Ambulatory Visit (HOSPITAL_COMMUNITY)
Admission: RE | Admit: 2017-03-21 | Discharge: 2017-03-21 | Disposition: A | Discharge status: Home / Self Care | Payer: 59 | Source: Ambulatory Visit | Attending: Obstetrics & Gynecology | Admitting: Obstetrics & Gynecology

## 2017-03-21 ENCOUNTER — Encounter (HOSPITAL_COMMUNITY): Payer: Self-pay

## 2017-03-21 DIAGNOSIS — Z6838 Body mass index (BMI) 38.0-38.9, adult: Secondary | ICD-10-CM | POA: Insufficient documentation

## 2017-03-21 DIAGNOSIS — O09812 Supervision of pregnancy resulting from assisted reproductive technology, second trimester: Secondary | ICD-10-CM | POA: Insufficient documentation

## 2017-03-21 DIAGNOSIS — Z3A19 19 weeks gestation of pregnancy: Secondary | ICD-10-CM | POA: Diagnosis not present

## 2017-03-21 DIAGNOSIS — O24410 Gestational diabetes mellitus in pregnancy, diet controlled: Secondary | ICD-10-CM | POA: Insufficient documentation

## 2017-03-21 DIAGNOSIS — O09512 Supervision of elderly primigravida, second trimester: Secondary | ICD-10-CM | POA: Insufficient documentation

## 2017-03-21 DIAGNOSIS — E669 Obesity, unspecified: Secondary | ICD-10-CM | POA: Diagnosis not present

## 2017-03-21 DIAGNOSIS — O99212 Obesity complicating pregnancy, second trimester: Secondary | ICD-10-CM | POA: Diagnosis not present

## 2017-03-21 DIAGNOSIS — O30042 Twin pregnancy, dichorionic/diamniotic, second trimester: Secondary | ICD-10-CM | POA: Diagnosis not present

## 2017-03-21 DIAGNOSIS — O09522 Supervision of elderly multigravida, second trimester: Secondary | ICD-10-CM | POA: Diagnosis not present

## 2017-03-22 ENCOUNTER — Other Ambulatory Visit (HOSPITAL_COMMUNITY): Payer: Self-pay | Admitting: *Deleted

## 2017-03-22 DIAGNOSIS — O30042 Twin pregnancy, dichorionic/diamniotic, second trimester: Secondary | ICD-10-CM

## 2017-03-25 ENCOUNTER — Telehealth: Payer: Self-pay | Admitting: *Deleted

## 2017-03-25 NOTE — Telephone Encounter (Signed)
REceived a call from [facility] about a critical value on CBG's.  Spoke with patient who said she had a value of 58 on 03/22/17  She stated that she didn't eat very much that morning then worked out.  The CBG was 2 hrs PP.  Pt denies any other problems with her CBG's

## 2017-03-27 ENCOUNTER — Ambulatory Visit (INDEPENDENT_AMBULATORY_CARE_PROVIDER_SITE_OTHER): Payer: 59 | Admitting: Obstetrics & Gynecology

## 2017-03-27 VITALS — BP 141/91 | HR 91 | Wt 212.0 lb

## 2017-03-27 DIAGNOSIS — O24410 Gestational diabetes mellitus in pregnancy, diet controlled: Secondary | ICD-10-CM

## 2017-03-27 NOTE — Progress Notes (Signed)
   PRENATAL VISIT NOTE  Subjective:  Cynthia [name] is a 36 y.o. G1P0 at [redacted]w[redacted]d being seen today for ongoing prenatal care.  She is currently monitored for the following issues for this high-risk pregnancy and has Hyperlipidemia; Severe obesity (BMI >= 40) ([place]); Anxiety state; Menometrorrhagia; Anemia; Conjunctival cyst of both eyes; Pelvic adhesive disease; Endometrioma of ovary; History of endometriosis; Supervision of high risk primigravida in second trimester in patient 46 years or older at time of delivery; GBS bacteriuria; and Gestational diabetes on her problem list.  Patient reports no complaints.  Contractions: Not present. Vag. Bleeding: None.  Movement: Present. Denies leaking of fluid.   The following portions of the patient's history were reviewed and updated as appropriate: allergies, current medications, past family history, past medical history, past social history, past surgical history and problem list. Problem list updated.  Objective:   Vitals:   03/27/17 1050  BP: (!) 141/91  Pulse: 91  Weight: 212 lb (96.2 kg)    Fetal Status: Fetal Heart Rate (bpm): 144/148   Movement: Present     General:  Alert, oriented and cooperative. Patient is in no acute distress.  Skin: Skin is warm and dry. No rash noted.   Cardiovascular: Normal heart rate noted  Respiratory: Normal respiratory effort, no problems with respiration noted  Abdomen: Soft, gravid, appropriate for gestational age. Pain/Pressure: Absent     Pelvic:  Cervical exam deferred        Extremities: Normal range of motion.  Edema: None  Mental Status: Normal mood and affect. Normal behavior. Normal judgment and thought content.   Assessment and Plan:  Pregnancy: G1P0 at [redacted]w[redacted]d  1. Diet controlled gestational diabetes mellitus (GDM) in second trimester -a few highs post prandial.  Pt ate bagels.  Using the Babyscripts product well.  2.  BP  141/91.  Rpt is 127/76 with arm supported.  Pt takes BP at home and is  bringing cuff in to compare t ours.  She says it is elevated at home, bu her husband's BP is also elevated when using the cuff.  Pt states BP was high at MFM office at 19 weeks.    3.  Twins--well grown; [place] q4 weeks.  Preterm labor symptoms and general obstetric precautions including but not limited to vaginal bleeding, contractions, leaking of fluid and fetal movement were reviewed in detail with the patient. Please refer to After Visit Summary for other counseling recommendations.  Return in about 2 weeks (around 04/10/2017).   Guss Bunde, MD

## 2017-04-09 MED FILL — FREESTYLE LANCETS: 25 days supply | Qty: 100 | Fill #1

## 2017-04-09 MED FILL — FREESTYLE LITE TEST STRIP: 25 days supply | Qty: 100 | Fill #2

## 2017-04-11 ENCOUNTER — Ambulatory Visit (INDEPENDENT_AMBULATORY_CARE_PROVIDER_SITE_OTHER): Payer: 59 | Admitting: *Deleted

## 2017-04-11 VITALS — BP 132/81

## 2017-04-11 DIAGNOSIS — O24410 Gestational diabetes mellitus in pregnancy, diet controlled: Secondary | ICD-10-CM

## 2017-04-11 NOTE — Progress Notes (Signed)
Pt here for BP check today of 132/81.  She denies any swelling or HA's today.  She will return on 04/16/17 for appt with Dr Gala Romney.

## 2017-04-16 ENCOUNTER — Ambulatory Visit (INDEPENDENT_AMBULATORY_CARE_PROVIDER_SITE_OTHER): Payer: 59 | Admitting: Obstetrics & Gynecology

## 2017-04-16 VITALS — BP 125/86 | HR 91 | Wt 214.0 lb

## 2017-04-16 DIAGNOSIS — O24410 Gestational diabetes mellitus in pregnancy, diet controlled: Secondary | ICD-10-CM

## 2017-04-16 DIAGNOSIS — O09512 Supervision of elderly primigravida, second trimester: Secondary | ICD-10-CM

## 2017-04-16 NOTE — Progress Notes (Signed)
   PRENATAL VISIT NOTE  Subjective:  Cynthia [name] is a 36 y.o. G1P0 at [redacted]w[redacted]d being seen today for ongoing prenatal care.  She is currently monitored for the following issues for this high-risk pregnancy and has Hyperlipidemia; Severe obesity (BMI >= 40) ([place]); Anxiety state; Menometrorrhagia; Anemia; Conjunctival cyst of both eyes; Pelvic adhesive disease; Endometrioma of ovary; History of endometriosis; Supervision of high risk primigravida in second trimester in patient 25 years or older at time of delivery; GBS bacteriuria; and Gestational diabetes on her problem list.  Patient reports back pain between scapula.  Contractions: Not present. Vag. Bleeding: None.  Movement: Present. Denies leaking of fluid.   The following portions of the patient's history were reviewed and updated as appropriate: allergies, current medications, past family history, past medical history, past social history, past surgical history and problem list. Problem list updated.  Objective:   Vitals:   04/16/17 1443  BP: 125/86  Pulse: 91  Weight: 214 lb (97.1 kg)    Fetal Status: Fetal Heart Rate (bpm): 145/150 Fundal Height: 33 cm Movement: Present     General:  Alert, oriented and cooperative. Patient is in no acute distress.  Skin: Skin is warm and dry. No rash noted.   Cardiovascular: Normal heart rate noted  Respiratory: Normal respiratory effort, no problems with respiration noted  Abdomen: Soft, gravid, appropriate for gestational age. Pain/Pressure: Absent     Pelvic:  Cervical exam deferred        Extremities: Normal range of motion.  Edema: None  Mental Status: Normal mood and affect. Normal behavior. Normal judgment and thought content.   Assessment and Plan:  Pregnancy: G1P0 at [redacted]w[redacted]d  1. Supervision of high risk primigravida in second trimester in patient 35 years or older at time of delivery MFM scans for twin growth Home BP cuff is not accurate.  Pt should stop using (compared with ours  today)  2. Diet controlled gestational diabetes mellitus (GDM) in second trimester -Good control on diet. =Uses babyscripts product for this  Preterm labor symptoms and general obstetric precautions including but not limited to vaginal bleeding, contractions, leaking of fluid and fetal movement were reviewed in detail with the patient. Please refer to After Visit Summary for other counseling recommendations.  Return in about 3 weeks (around 05/07/2017).   Guss Bunde, MD

## 2017-04-16 NOTE — Patient Instructions (Signed)
Back Pain in Pregnancy Back pain during pregnancy is common. Back pain may be caused by several factors that are related to changes during your pregnancy. Follow these instructions at home: Managing pain, stiffness, and swelling   If directed, apply ice for sudden (acute) back pain.  Put ice in a plastic bag.  Place a towel between your skin and the bag.  Leave the ice on for 20 minutes, 2-3 times per day.  If directed, apply heat to the affected area before you exercise:  Place a towel between your skin and the heat pack or heating pad.  Leave the heat on for 20-30 minutes.  Remove the heat if your skin turns bright red. This is especially important if you are unable to feel pain, heat, or cold. You may have a greater risk of getting burned. Activity   Exercise as told by your health care provider. Exercising is the best way to prevent or manage back pain.  Listen to your body when lifting. If lifting hurts, ask for help or bend your knees. This uses your leg muscles instead of your back muscles.  Squat down when picking up something from the floor. Do not bend over.  Only use bed rest as told by your health care provider. Bed rest should only be used for the most severe episodes of back pain. Standing, Sitting, and Lying Down   Do not stand in one place for long periods of time.  Use good posture when sitting. Make sure your head rests over your shoulders and is not hanging forward. Use a pillow on your lower back if necessary.  Try sleeping on your side, preferably the left side, with a pillow or two between your legs. If you are sore after a night's rest, your bed may be too soft. A firm mattress may provide more support for your back during pregnancy. General instructions   Do not wear high heels.  Eat a healthy diet. Try to gain weight within your health care provider's recommendations.  Use a maternity girdle, elastic sling, or back brace as told by your health care  provider.  Take over-the-counter and prescription medicines only as told by your health care provider.  Keep all follow-up visits as told by your health care provider. This is important. This includes any visits with any specialists, such as a physical therapist. Contact a health care provider if:  Your back pain interferes with your daily activities.  You have increasing pain in other parts of your body. Get help right away if:  You develop numbness, tingling, weakness, or problems with the use of your arms or legs.  You develop severe back pain that is not controlled with medicine.  You have a sudden change in bowel or bladder control.  You develop shortness of breath, dizziness, or you faint.  You develop nausea, vomiting, or sweating.  You have back pain that is a rhythmic, cramping pain similar to labor pains. Labor pain is usually 1-2 minutes apart, lasts for about 1 minute, and involves a bearing down feeling or pressure in your pelvis.  You have back pain and your water breaks or you have vaginal bleeding.  You have back pain or numbness that travels down your leg.  Your back pain developed after you fell.  You develop pain on one side of your back.  You see blood in your urine.  You develop skin blisters in the area of your back pain. This information is not intended to replace  advice given to you by your health care provider. Make sure you discuss any questions you have with your health care provider. Document Released: 03/06/2006 Document Revised: 05/03/2016 Document Reviewed: 08/10/2015 Elsevier Interactive Patient Education  2017 [employer].

## 2017-04-18 ENCOUNTER — Ambulatory Visit (HOSPITAL_COMMUNITY)
Admission: RE | Admit: 2017-04-18 | Discharge: 2017-04-18 | Disposition: A | Discharge status: Home / Self Care | Payer: 59 | Source: Ambulatory Visit | Attending: Obstetrics & Gynecology | Admitting: Obstetrics & Gynecology

## 2017-04-18 DIAGNOSIS — E669 Obesity, unspecified: Secondary | ICD-10-CM | POA: Diagnosis not present

## 2017-04-18 DIAGNOSIS — O09812 Supervision of pregnancy resulting from assisted reproductive technology, second trimester: Secondary | ICD-10-CM | POA: Diagnosis not present

## 2017-04-18 DIAGNOSIS — O30042 Twin pregnancy, dichorionic/diamniotic, second trimester: Secondary | ICD-10-CM | POA: Diagnosis not present

## 2017-04-18 DIAGNOSIS — O24410 Gestational diabetes mellitus in pregnancy, diet controlled: Secondary | ICD-10-CM | POA: Diagnosis not present

## 2017-04-18 DIAGNOSIS — Z6838 Body mass index (BMI) 38.0-38.9, adult: Secondary | ICD-10-CM | POA: Insufficient documentation

## 2017-04-18 DIAGNOSIS — O99212 Obesity complicating pregnancy, second trimester: Secondary | ICD-10-CM | POA: Insufficient documentation

## 2017-04-18 DIAGNOSIS — O321XX1 Maternal care for breech presentation, fetus 1: Secondary | ICD-10-CM | POA: Insufficient documentation

## 2017-04-18 DIAGNOSIS — Z3A23 23 weeks gestation of pregnancy: Secondary | ICD-10-CM | POA: Insufficient documentation

## 2017-04-19 ENCOUNTER — Other Ambulatory Visit (HOSPITAL_COMMUNITY): Payer: Self-pay | Admitting: *Deleted

## 2017-04-19 DIAGNOSIS — Z3A31 31 weeks gestation of pregnancy: Secondary | ICD-10-CM

## 2017-04-19 DIAGNOSIS — O09523 Supervision of elderly multigravida, third trimester: Secondary | ICD-10-CM

## 2017-04-19 DIAGNOSIS — O133 Gestational [pregnancy-induced] hypertension without significant proteinuria, third trimester: Secondary | ICD-10-CM

## 2017-04-19 DIAGNOSIS — O30049 Twin pregnancy, dichorionic/diamniotic, unspecified trimester: Secondary | ICD-10-CM

## 2017-04-19 DIAGNOSIS — O99213 Obesity complicating pregnancy, third trimester: Secondary | ICD-10-CM

## 2017-04-19 DIAGNOSIS — O24410 Gestational diabetes mellitus in pregnancy, diet controlled: Secondary | ICD-10-CM

## 2017-04-24 ENCOUNTER — Telehealth: Payer: Self-pay

## 2017-04-24 NOTE — Telephone Encounter (Signed)
Pt called stating that she was experiencing pain on the left side of her stomach. Considering that this pt is pregnant with twins I recommended that she should be seen at the [facility]. Pt states that she didn't think the pain was bad enough for that so she would wait it out and see if it got worse. I emphasized the importance of going to the [facility] if pain worsened or if other symptoms came up.

## 2017-05-07 ENCOUNTER — Ambulatory Visit (INDEPENDENT_AMBULATORY_CARE_PROVIDER_SITE_OTHER): Payer: 59 | Admitting: Obstetrics & Gynecology

## 2017-05-07 VITALS — BP 135/92 | HR 108 | Wt 220.0 lb

## 2017-05-07 DIAGNOSIS — O30042 Twin pregnancy, dichorionic/diamniotic, second trimester: Secondary | ICD-10-CM

## 2017-05-07 DIAGNOSIS — O30049 Twin pregnancy, dichorionic/diamniotic, unspecified trimester: Secondary | ICD-10-CM | POA: Insufficient documentation

## 2017-05-07 DIAGNOSIS — O09512 Supervision of elderly primigravida, second trimester: Secondary | ICD-10-CM

## 2017-05-07 DIAGNOSIS — O24410 Gestational diabetes mellitus in pregnancy, diet controlled: Secondary | ICD-10-CM

## 2017-05-07 DIAGNOSIS — R030 Elevated blood-pressure reading, without diagnosis of hypertension: Secondary | ICD-10-CM | POA: Diagnosis not present

## 2017-05-07 LAB — CBC
HCT: 32.8 % — ABNORMAL LOW (ref 35.0–45.0)
Hemoglobin: 10.9 g/dL — ABNORMAL LOW (ref 11.7–15.5)
MCH: 29.9 pg (ref 27.0–33.0)
MCHC: 33.2 g/dL (ref 32.0–36.0)
MCV: 89.9 fL (ref 80.0–100.0)
MPV: 8.5 fL (ref 7.5–12.5)
Platelets: 350 10*3/uL (ref 140–400)
RBC: 3.65 MIL/uL — AB (ref 3.80–5.10)
RDW: 13.8 % (ref 11.0–15.0)
WBC: 9 10*3/uL (ref 3.8–10.8)

## 2017-05-07 NOTE — Progress Notes (Signed)
   PRENATAL VISIT NOTE  Subjective:  Cynthia [name] is a 36 y.o. G1P0 at [redacted]w[redacted]d being seen today for ongoing prenatal care.  She is currently monitored for the following issues for this high-risk pregnancy and has Hyperlipidemia; Severe obesity (BMI >= 40) ([place]); Anxiety state; Menometrorrhagia; Anemia; Conjunctival cyst of both eyes; Pelvic adhesive disease; Endometrioma of ovary; History of endometriosis; Supervision of high risk primigravida in second trimester in patient 35 years or older at time of delivery; GBS bacteriuria; Gestational diabetes; and Twin gestation, dichorionic diamniotic on her problem list.  Patient reports no complaints.  Contractions: Not present. Vag. Bleeding: None.  Movement: Present. Denies leaking of fluid.   The following portions of the patient's history were reviewed and updated as appropriate: allergies, current medications, past family history, past medical history, past social history, past surgical history and problem list. Problem list updated.  Objective:   Vitals:   05/07/17 1545  BP: (!) 135/92  Pulse: (!) 108  Weight: 220 lb (99.8 kg)    Fetal Status: Fetal Heart Rate (bpm): 149/153   Movement: Present     General:  Alert, oriented and cooperative. Patient is in no acute distress.  Skin: Skin is warm and dry. No rash noted.   Cardiovascular: Normal heart rate noted  Respiratory: Normal respiratory effort, no problems with respiration noted  Abdomen: Soft, gravid, appropriate for gestational age. Pain/Pressure: Absent     Pelvic:  Cervical exam deferred        Extremities: Normal range of motion.  Edema: Trace  Mental Status: Normal mood and affect. Normal behavior. Normal judgment and thought content.   Assessment and Plan:  Pregnancy: G1P0 at [redacted]w[redacted]d  1. Elevated BP without diagnosis of hypertension - Pt to get home BP cuff; Rpt BP on [place] - Comprehensive metabolic panel - CBC - Protein / creatinine ratio, urine - PIH signs  reviewed  3. Diet controlled gestational diabetes mellitus (GDM) in second trimester -some missed values, but almost all values are within range; continue diet.  4. Dichorionic diamniotic twin pregnancy in second trimester -f/u growth next week.   LVOT needs to be seen on Baby B  Preterm labor symptoms and general obstetric precautions including but not limited to vaginal bleeding, contractions, leaking of fluid and fetal movement were reviewed in detail with the patient. Please refer to After Visit Summary for other counseling recommendations.  No Follow-up on file.   [name], MD

## 2017-05-08 ENCOUNTER — Encounter: Payer: Self-pay | Admitting: *Deleted

## 2017-05-08 ENCOUNTER — Telehealth: Payer: Self-pay | Admitting: *Deleted

## 2017-05-08 LAB — COMPREHENSIVE METABOLIC PANEL
ALT: 11 U/L (ref 6–29)
AST: 13 U/L (ref 10–30)
Albumin: 3 g/dL — ABNORMAL LOW (ref 3.6–5.1)
Alkaline Phosphatase: 89 U/L (ref 33–115)
BILIRUBIN TOTAL: 0.3 mg/dL (ref 0.2–1.2)
BUN: 8 mg/dL (ref 7–25)
CALCIUM: 9 mg/dL (ref 8.6–10.2)
CO2: 22 mmol/L (ref 20–31)
Chloride: 106 mmol/L (ref 98–110)
Creat: 0.54 mg/dL (ref 0.50–1.10)
Glucose, Bld: 90 mg/dL (ref 65–99)
Potassium: 3.5 mmol/L (ref 3.5–5.3)
SODIUM: 139 mmol/L (ref 135–146)
Total Protein: 5.7 g/dL — ABNORMAL LOW (ref 6.1–8.1)

## 2017-05-08 LAB — PROTEIN / CREATININE RATIO, URINE
CREATININE, URINE: 79 mg/dL (ref 20–320)
PROTEIN CREATININE RATIO: 139 mg/g{creat} (ref 21–161)
TOTAL PROTEIN, URINE: 11 mg/dL (ref 5–24)

## 2017-05-08 NOTE — Telephone Encounter (Signed)
Pt notified of normal baseline labs for PIH.  Pt has ordered a BP cuff that she will bring with her on Friday for her BP check.   BP today was 142/80.  She is advised to start OTC FeSo4 325 mg and Colace daily due to her Hgb being 10.9.

## 2017-05-09 ENCOUNTER — Ambulatory Visit (INDEPENDENT_AMBULATORY_CARE_PROVIDER_SITE_OTHER): Payer: 59 | Admitting: *Deleted

## 2017-05-09 ENCOUNTER — Telehealth: Payer: Self-pay | Admitting: *Deleted

## 2017-05-09 ENCOUNTER — Encounter: Payer: Self-pay | Admitting: *Deleted

## 2017-05-09 ENCOUNTER — Other Ambulatory Visit: Payer: Self-pay | Admitting: *Deleted

## 2017-05-09 VITALS — BP 136/87

## 2017-05-09 DIAGNOSIS — O0992 Supervision of high risk pregnancy, unspecified, second trimester: Secondary | ICD-10-CM

## 2017-05-09 DIAGNOSIS — O132 Gestational [pregnancy-induced] hypertension without significant proteinuria, second trimester: Secondary | ICD-10-CM

## 2017-05-09 DIAGNOSIS — O0990 Supervision of high risk pregnancy, unspecified, unspecified trimester: Secondary | ICD-10-CM

## 2017-05-09 MED ORDER — BETAMETHASONE SOD PHOS & ACET 6 (3-3) MG/ML IJ SUSP
12.0000 mg | INTRAMUSCULAR | Status: AC
  Administered 2017-05-09 – 2017-05-10 (×2): 12 mg via INTRAMUSCULAR

## 2017-05-09 NOTE — Telephone Encounter (Signed)
BPP added to U/S scheduled on 05/17/17 per Dr Gala Romney.   Pt will take BP today and call with results.  If BP elevated she will come in for 1st injection of [place]one and 2nd one tomorrow.

## 2017-05-10 ENCOUNTER — Ambulatory Visit (INDEPENDENT_AMBULATORY_CARE_PROVIDER_SITE_OTHER): Payer: 59 | Admitting: *Deleted

## 2017-05-10 VITALS — BP 136/89 | HR 117

## 2017-05-10 DIAGNOSIS — R030 Elevated blood-pressure reading, without diagnosis of hypertension: Secondary | ICD-10-CM

## 2017-05-10 DIAGNOSIS — O132 Gestational [pregnancy-induced] hypertension without significant proteinuria, second trimester: Secondary | ICD-10-CM

## 2017-05-15 MED FILL — FREESTYLE LITE TEST STRIP: 25 days supply | Qty: 100 | Fill #3

## 2017-05-17 ENCOUNTER — Encounter (HOSPITAL_COMMUNITY): Payer: Self-pay

## 2017-05-17 ENCOUNTER — Other Ambulatory Visit: Payer: Self-pay | Admitting: Obstetrics & Gynecology

## 2017-05-17 ENCOUNTER — Ambulatory Visit (HOSPITAL_COMMUNITY)
Admission: RE | Admit: 2017-05-17 | Discharge: 2017-05-17 | Disposition: A | Discharge status: Home / Self Care | Payer: 59 | Source: Ambulatory Visit | Attending: Obstetrics & Gynecology | Admitting: Obstetrics & Gynecology

## 2017-05-17 ENCOUNTER — Other Ambulatory Visit (HOSPITAL_COMMUNITY): Payer: Self-pay | Admitting: Maternal & Fetal Medicine

## 2017-05-17 DIAGNOSIS — Z3A27 27 weeks gestation of pregnancy: Secondary | ICD-10-CM | POA: Insufficient documentation

## 2017-05-17 DIAGNOSIS — Z6838 Body mass index (BMI) 38.0-38.9, adult: Secondary | ICD-10-CM | POA: Diagnosis not present

## 2017-05-17 DIAGNOSIS — O24410 Gestational diabetes mellitus in pregnancy, diet controlled: Secondary | ICD-10-CM | POA: Insufficient documentation

## 2017-05-17 DIAGNOSIS — O30049 Twin pregnancy, dichorionic/diamniotic, unspecified trimester: Secondary | ICD-10-CM

## 2017-05-17 DIAGNOSIS — O30042 Twin pregnancy, dichorionic/diamniotic, second trimester: Secondary | ICD-10-CM | POA: Insufficient documentation

## 2017-05-17 DIAGNOSIS — O0990 Supervision of high risk pregnancy, unspecified, unspecified trimester: Secondary | ICD-10-CM

## 2017-05-17 DIAGNOSIS — O09812 Supervision of pregnancy resulting from assisted reproductive technology, second trimester: Secondary | ICD-10-CM | POA: Insufficient documentation

## 2017-05-17 DIAGNOSIS — O99212 Obesity complicating pregnancy, second trimester: Secondary | ICD-10-CM | POA: Diagnosis not present

## 2017-05-17 NOTE — ED Notes (Signed)
Pt denies any visual changes, epigastric pain, or headaches.  Small amount of swelling in hands today.

## 2017-05-19 ENCOUNTER — Encounter: Payer: Self-pay | Admitting: Obstetrics & Gynecology

## 2017-05-20 ENCOUNTER — Other Ambulatory Visit (HOSPITAL_COMMUNITY): Payer: Self-pay | Admitting: *Deleted

## 2017-05-20 DIAGNOSIS — O132 Gestational [pregnancy-induced] hypertension without significant proteinuria, second trimester: Secondary | ICD-10-CM

## 2017-05-20 DIAGNOSIS — Z3A31 31 weeks gestation of pregnancy: Secondary | ICD-10-CM

## 2017-05-20 DIAGNOSIS — O30043 Twin pregnancy, dichorionic/diamniotic, third trimester: Secondary | ICD-10-CM

## 2017-05-20 DIAGNOSIS — O24410 Gestational diabetes mellitus in pregnancy, diet controlled: Secondary | ICD-10-CM

## 2017-05-20 DIAGNOSIS — O99213 Obesity complicating pregnancy, third trimester: Secondary | ICD-10-CM

## 2017-05-20 DIAGNOSIS — O09513 Supervision of elderly primigravida, third trimester: Secondary | ICD-10-CM

## 2017-05-21 ENCOUNTER — Ambulatory Visit (INDEPENDENT_AMBULATORY_CARE_PROVIDER_SITE_OTHER): Payer: 59 | Admitting: Obstetrics & Gynecology

## 2017-05-21 VITALS — BP 144/95 | HR 112 | Wt 219.0 lb

## 2017-05-21 DIAGNOSIS — O24410 Gestational diabetes mellitus in pregnancy, diet controlled: Secondary | ICD-10-CM

## 2017-05-21 DIAGNOSIS — O132 Gestational [pregnancy-induced] hypertension without significant proteinuria, second trimester: Secondary | ICD-10-CM

## 2017-05-21 DIAGNOSIS — O09512 Supervision of elderly primigravida, second trimester: Secondary | ICD-10-CM | POA: Diagnosis not present

## 2017-05-21 DIAGNOSIS — Z23 Encounter for immunization: Secondary | ICD-10-CM

## 2017-05-21 DIAGNOSIS — O30042 Twin pregnancy, dichorionic/diamniotic, second trimester: Secondary | ICD-10-CM

## 2017-05-21 DIAGNOSIS — O139 Gestational [pregnancy-induced] hypertension without significant proteinuria, unspecified trimester: Secondary | ICD-10-CM

## 2017-05-21 LAB — CBC
HEMATOCRIT: 34.3 % — AB (ref 35.0–45.0)
Hemoglobin: 11.1 g/dL — ABNORMAL LOW (ref 11.7–15.5)
MCH: 29.2 pg (ref 27.0–33.0)
MCHC: 32.4 g/dL (ref 32.0–36.0)
MCV: 90.3 fL (ref 80.0–100.0)
MPV: 8.8 fL (ref 7.5–12.5)
PLATELETS: 360 10*3/uL (ref 140–400)
RBC: 3.8 MIL/uL (ref 3.80–5.10)
RDW: 13.8 % (ref 11.0–15.0)
WBC: 10.5 10*3/uL (ref 3.8–10.8)

## 2017-05-22 ENCOUNTER — Other Ambulatory Visit: Payer: Self-pay | Admitting: *Deleted

## 2017-05-22 DIAGNOSIS — O99213 Obesity complicating pregnancy, third trimester: Secondary | ICD-10-CM

## 2017-05-22 DIAGNOSIS — O09523 Supervision of elderly multigravida, third trimester: Secondary | ICD-10-CM

## 2017-05-22 DIAGNOSIS — O133 Gestational [pregnancy-induced] hypertension without significant proteinuria, third trimester: Secondary | ICD-10-CM

## 2017-05-22 DIAGNOSIS — O24410 Gestational diabetes mellitus in pregnancy, diet controlled: Secondary | ICD-10-CM

## 2017-05-22 DIAGNOSIS — O30043 Twin pregnancy, dichorionic/diamniotic, third trimester: Secondary | ICD-10-CM

## 2017-05-22 DIAGNOSIS — Z3A31 31 weeks gestation of pregnancy: Secondary | ICD-10-CM

## 2017-05-22 DIAGNOSIS — O24419 Gestational diabetes mellitus in pregnancy, unspecified control: Secondary | ICD-10-CM

## 2017-05-22 LAB — COMPREHENSIVE METABOLIC PANEL
ALT: 13 U/L (ref 6–29)
AST: 15 U/L (ref 10–30)
Albumin: 3.1 g/dL — ABNORMAL LOW (ref 3.6–5.1)
Alkaline Phosphatase: 116 U/L — ABNORMAL HIGH (ref 33–115)
BILIRUBIN TOTAL: 0.4 mg/dL (ref 0.2–1.2)
BUN: 7 mg/dL (ref 7–25)
CHLORIDE: 104 mmol/L (ref 98–110)
CO2: 22 mmol/L (ref 20–31)
CREATININE: 0.53 mg/dL (ref 0.50–1.10)
Calcium: 8.3 mg/dL — ABNORMAL LOW (ref 8.6–10.2)
GLUCOSE: 85 mg/dL (ref 65–99)
Potassium: 3.6 mmol/L (ref 3.5–5.3)
SODIUM: 137 mmol/L (ref 135–146)
Total Protein: 6 g/dL — ABNORMAL LOW (ref 6.1–8.1)

## 2017-05-22 LAB — PROTEIN / CREATININE RATIO, URINE
Creatinine, Urine: 60 mg/dL (ref 20–320)
Protein Creatinine Ratio: 200 mg/g creat — ABNORMAL HIGH (ref 21–161)
Total Protein, Urine: 12 mg/dL (ref 5–24)

## 2017-05-23 LAB — OB RESULTS CONSOLE GBS: STREP GROUP B AG: POSITIVE

## 2017-05-23 LAB — CULTURE, BETA STREP (GROUP B ONLY)

## 2017-05-24 ENCOUNTER — Encounter (HOSPITAL_COMMUNITY): Payer: Self-pay

## 2017-05-24 ENCOUNTER — Other Ambulatory Visit: Payer: Self-pay | Admitting: Obstetrics & Gynecology

## 2017-05-24 ENCOUNTER — Ambulatory Visit (HOSPITAL_COMMUNITY)
Admission: RE | Admit: 2017-05-24 | Discharge: 2017-05-24 | Disposition: A | Discharge status: Home / Self Care | Payer: 59 | Source: Ambulatory Visit | Attending: Obstetrics & Gynecology | Admitting: Obstetrics & Gynecology

## 2017-05-24 DIAGNOSIS — O139 Gestational [pregnancy-induced] hypertension without significant proteinuria, unspecified trimester: Secondary | ICD-10-CM

## 2017-05-24 DIAGNOSIS — O30043 Twin pregnancy, dichorionic/diamniotic, third trimester: Secondary | ICD-10-CM | POA: Insufficient documentation

## 2017-05-24 DIAGNOSIS — Z3A28 28 weeks gestation of pregnancy: Secondary | ICD-10-CM

## 2017-05-24 DIAGNOSIS — O133 Gestational [pregnancy-induced] hypertension without significant proteinuria, third trimester: Secondary | ICD-10-CM | POA: Diagnosis not present

## 2017-05-24 NOTE — ED Notes (Signed)
Pt denies any HA, visual disturbances or epigastric pain.

## 2017-05-28 ENCOUNTER — Ambulatory Visit (INDEPENDENT_AMBULATORY_CARE_PROVIDER_SITE_OTHER): Payer: 59 | Admitting: Obstetrics & Gynecology

## 2017-05-28 VITALS — BP 144/97 | HR 96 | Wt 223.0 lb

## 2017-05-28 DIAGNOSIS — O132 Gestational [pregnancy-induced] hypertension without significant proteinuria, second trimester: Secondary | ICD-10-CM | POA: Insufficient documentation

## 2017-05-28 DIAGNOSIS — O24410 Gestational diabetes mellitus in pregnancy, diet controlled: Secondary | ICD-10-CM

## 2017-05-28 DIAGNOSIS — O09512 Supervision of elderly primigravida, second trimester: Secondary | ICD-10-CM

## 2017-05-28 DIAGNOSIS — O30043 Twin pregnancy, dichorionic/diamniotic, third trimester: Secondary | ICD-10-CM

## 2017-05-28 HISTORY — DX: Gestational (pregnancy-induced) hypertension without significant proteinuria, second trimester: O13.2

## 2017-05-28 MED ORDER — LABETALOL HCL 200 MG PO TABS: 200.0000 mg | ORAL_TABLET | Freq: Two times a day (BID) | ORAL | 3 refills | Status: DC

## 2017-05-28 MED FILL — LABETALOL HCL 200 MG TABLET: 200 | 30 days supply | Qty: 60 | Fill #0

## 2017-05-28 NOTE — Progress Notes (Signed)
   PRENATAL VISIT NOTE  Subjective:  Cynthia [name] is a 36 y.o. G1P0 at [redacted]w[redacted]d being seen today for ongoing prenatal care.  She is currently monitored for the following issues for this high-risk pregnancy and has Hyperlipidemia; Severe obesity (BMI >= 40) ([place]); Anxiety state; Menometrorrhagia; Anemia; Conjunctival cyst of both eyes; Pelvic adhesive disease; Endometrioma of ovary; History of endometriosis; Supervision of high risk primigravida in second trimester in patient 72 years or older at time of delivery; GBS bacteriuria; Gestational diabetes; Twin gestation, dichorionic diamniotic; and Gestational hypertension w/o significant proteinuria in 2nd trimester on her problem list.  Patient reports no complaints.  Contractions: Not present. Vag. Bleeding: None.  Movement: Present. Denies leaking of fluid.   The following portions of the patient's history were reviewed and updated as appropriate: allergies, current medications, past family history, past medical history, past social history, past surgical history and problem list. Problem list updated.  Objective:   Vitals:   05/21/17 1449  BP: (!) 144/95  Pulse: (!) 112  Weight: 219 lb (99.3 kg)    Fetal Status: Fetal Heart Rate (bpm): 150/153 Fundal Height: 37 cm Movement: Present     General:  Alert, oriented and cooperative. Patient is in no acute distress.  Skin: Skin is warm and dry. No rash noted.   Cardiovascular: Normal heart rate noted  Respiratory: Normal respiratory effort, no problems with respiration noted  Abdomen: Soft, gravid, appropriate for gestational age. Pain/Pressure: Absent     Pelvic:  Cervical exam deferred        Extremities: Normal range of motion.  Edema: Trace  Mental Status: Normal mood and affect. Normal behavior. Normal judgment and thought content.   Assessment and Plan:  Pregnancy: G1P0 at [redacted]w[redacted]d  1. PIH (pregnancy induced hypertension), antepartum - Comprehensive metabolic panel - CBC - Protein /  creatinine ratio, urine - Culture, beta strep (group b only) - Tdap vaccine greater than or equal to 7yo IM  2. Supervision of high risk primigravida in second trimester in patient 35 years or older at time of delivery   3. Dichorionic diamniotic twin pregnancy in second trimester Nml growth [place]  4. Diet controlled gestational diabetes mellitus (GDM) in second trimester Doing well, using babyscripts app  5. Gestational hypertension w/o significant proteinuria in 2nd trimester BPPs weekly until 32 weeks then 2x week testing, weekly PIH labs, serial growth [place], home BP monitoring  Preterm labor symptoms and general obstetric precautions including but not limited to vaginal bleeding, contractions, leaking of fluid and fetal movement were reviewed in detail with the patient. Please refer to After Visit Summary for other counseling recommendations.   Weekly visits  [name], MD

## 2017-05-28 NOTE — Progress Notes (Signed)
   PRENATAL VISIT NOTE  Subjective:  Cynthia [name] is a 36 y.o. G1P0 at [redacted]w[redacted]d being seen today for ongoing prenatal care.  She is currently monitored for the following issues for this high-risk pregnancy and has Hyperlipidemia; Severe obesity (BMI >= 40) ([place]); Anxiety state; Menometrorrhagia; Anemia; Conjunctival cyst of both eyes; Pelvic adhesive disease; Endometrioma of ovary; History of endometriosis; Supervision of high risk primigravida in second trimester in patient 64 years or older at time of delivery; GBS bacteriuria; Gestational diabetes; Twin gestation, dichorionic diamniotic; and Gestational hypertension w/o significant proteinuria in 2nd trimester on her problem list.  Patient reports no complaints.  Contractions: Not present. Vag. Bleeding: None.  Movement: Present. Denies leaking of fluid.   The following portions of the patient's history were reviewed and updated as appropriate: allergies, current medications, past family history, past medical history, past social history, past surgical history and problem list. Problem list updated.  Objective:   Vitals:   05/28/17 1601  BP: (!) 144/97  Pulse: 96  Weight: 223 lb (101.2 kg)    Fetal Status: Fetal Heart Rate (bpm): 150/143   Movement: Present     General:  Alert, oriented and cooperative. Patient is in no acute distress.  Skin: Skin is warm and dry. No rash noted.   Cardiovascular: Normal heart rate noted  Respiratory: Normal respiratory effort, no problems with respiration noted  Abdomen: Soft, gravid, appropriate for gestational age. Pain/Pressure: Absent     Pelvic:  Cervical exam deferred        Extremities: Normal range of motion.  Edema: Trace  Mental Status: Normal mood and affect. Normal behavior. Normal judgment and thought content.   Assessment and Plan:  Pregnancy: G1P0 at [redacted]w[redacted]d  1. Pregnancy-induced hypertension in second trimester  - Protein / creatinine ratio, urine - CBC - Comprehensive metabolic  panel - start labetolol 200 mg BID  2. Dichorionic diamniotic twin pregnancy in third trimester - MFM monitoring weekly  3. Severe obesity (BMI >= 40) ([facility])   4. Supervision of high risk primigravida in second trimester in patient 35 years or older at time of delivery - she has already had a course of BMZ  5. Diet controlled gestational diabetes mellitus (GDM) in third trimester Sugars today are reasonable, occasionally high when she does not do the proper diet  Preterm labor symptoms and general obstetric precautions including but not limited to vaginal bleeding, contractions, leaking of fluid and fetal movement were reviewed in detail with the patient. Please refer to After Visit Summary for other counseling recommendations.  No Follow-up on file.   Emily Filbert, MD

## 2017-05-29 ENCOUNTER — Encounter: Payer: Self-pay | Admitting: *Deleted

## 2017-05-31 ENCOUNTER — Other Ambulatory Visit: Payer: Self-pay | Admitting: Obstetrics & Gynecology

## 2017-05-31 ENCOUNTER — Ambulatory Visit (HOSPITAL_COMMUNITY)
Admission: RE | Admit: 2017-05-31 | Discharge: 2017-05-31 | Disposition: A | Discharge status: Home / Self Care | Payer: 59 | Source: Ambulatory Visit | Attending: Obstetrics & Gynecology | Admitting: Obstetrics & Gynecology

## 2017-05-31 ENCOUNTER — Encounter (HOSPITAL_COMMUNITY): Payer: Self-pay

## 2017-05-31 DIAGNOSIS — O09813 Supervision of pregnancy resulting from assisted reproductive technology, third trimester: Secondary | ICD-10-CM

## 2017-05-31 DIAGNOSIS — O09819 Supervision of pregnancy resulting from assisted reproductive technology, unspecified trimester: Secondary | ICD-10-CM | POA: Diagnosis not present

## 2017-05-31 DIAGNOSIS — Z3A29 29 weeks gestation of pregnancy: Secondary | ICD-10-CM | POA: Diagnosis not present

## 2017-05-31 DIAGNOSIS — O24419 Gestational diabetes mellitus in pregnancy, unspecified control: Secondary | ICD-10-CM | POA: Diagnosis present

## 2017-05-31 DIAGNOSIS — O99213 Obesity complicating pregnancy, third trimester: Secondary | ICD-10-CM | POA: Diagnosis not present

## 2017-05-31 DIAGNOSIS — O30043 Twin pregnancy, dichorionic/diamniotic, third trimester: Secondary | ICD-10-CM

## 2017-05-31 DIAGNOSIS — O09513 Supervision of elderly primigravida, third trimester: Secondary | ICD-10-CM | POA: Diagnosis not present

## 2017-05-31 DIAGNOSIS — O24410 Gestational diabetes mellitus in pregnancy, diet controlled: Secondary | ICD-10-CM

## 2017-05-31 DIAGNOSIS — O133 Gestational [pregnancy-induced] hypertension without significant proteinuria, third trimester: Secondary | ICD-10-CM

## 2017-05-31 LAB — PROTEIN / CREATININE RATIO, URINE
CREATININE, URINE: 104 mg/dL
Protein Creatinine Ratio: 0.11 mg/mg{Cre} (ref 0.00–0.15)
Total Protein, Urine: 11 mg/dL

## 2017-05-31 LAB — CBC
HEMATOCRIT: 33.3 % — AB (ref 36.0–46.0)
HEMOGLOBIN: 11.2 g/dL — AB (ref 12.0–15.0)
MCH: 30 pg (ref 26.0–34.0)
MCHC: 33.6 g/dL (ref 30.0–36.0)
MCV: 89.3 fL (ref 78.0–100.0)
Platelets: 290 10*3/uL (ref 150–400)
RBC: 3.73 MIL/uL — ABNORMAL LOW (ref 3.87–5.11)
RDW: 14.2 % (ref 11.5–15.5)
WBC: 9.7 10*3/uL (ref 4.0–10.5)

## 2017-05-31 LAB — COMPREHENSIVE METABOLIC PANEL
ALBUMIN: 2.5 g/dL — AB (ref 3.5–5.0)
ALK PHOS: 124 U/L (ref 38–126)
ALT: 14 U/L (ref 14–54)
AST: 27 U/L (ref 15–41)
Anion gap: 9 (ref 5–15)
BUN: 7 mg/dL (ref 6–20)
CO2: 23 mmol/L (ref 22–32)
Calcium: 9.5 mg/dL (ref 8.9–10.3)
Chloride: 102 mmol/L (ref 101–111)
Creatinine, Ser: 0.56 mg/dL (ref 0.44–1.00)
GFR calc Af Amer: >60 mL/min (ref >60)
GLUCOSE: 85 mg/dL (ref 65–99)
POTASSIUM: 3.6 mmol/L (ref 3.5–5.1)
Sodium: 134 mmol/L — ABNORMAL LOW (ref 135–145)
TOTAL PROTEIN: 5.9 g/dL — AB (ref 6.5–8.1)
Total Bilirubin: 0.2 mg/dL — ABNORMAL LOW (ref 0.3–1.2)

## 2017-05-31 NOTE — ED Notes (Signed)
Repeated BP at end of visit today. BP 147/94.  Labs drawn and clean catch urine obtained.

## 2017-05-31 NOTE — ED Notes (Signed)
Pt here in [facility] for BPP today.  Pt reports mild HA which she has taken tylenol twice today with little relief.  No vision changes.  Pt reports upper mid sore to the touch pain which is constant and gets better with heat.  Pt has been checking BP at home and just started labetalol 200 mg this am.  BP at home was 136/95.  Today in [place] Left 143/85, Rt 148/95.  Pt states she is noticing some mild swelling in feet, hands and face.  Good fetal movement of twins.

## 2017-06-05 ENCOUNTER — Ambulatory Visit (INDEPENDENT_AMBULATORY_CARE_PROVIDER_SITE_OTHER): Payer: 59 | Admitting: Obstetrics & Gynecology

## 2017-06-05 VITALS — BP 124/87 | HR 101 | Wt 231.0 lb

## 2017-06-05 DIAGNOSIS — O09513 Supervision of elderly primigravida, third trimester: Secondary | ICD-10-CM

## 2017-06-05 DIAGNOSIS — O132 Gestational [pregnancy-induced] hypertension without significant proteinuria, second trimester: Secondary | ICD-10-CM

## 2017-06-05 DIAGNOSIS — O133 Gestational [pregnancy-induced] hypertension without significant proteinuria, third trimester: Secondary | ICD-10-CM | POA: Diagnosis not present

## 2017-06-05 DIAGNOSIS — O09512 Supervision of elderly primigravida, second trimester: Secondary | ICD-10-CM

## 2017-06-05 LAB — CBC
HCT: 34.7 % — ABNORMAL LOW (ref 35.0–45.0)
Hemoglobin: 11.4 g/dL — ABNORMAL LOW (ref 11.7–15.5)
MCH: 29.6 pg (ref 27.0–33.0)
MCHC: 32.9 g/dL (ref 32.0–36.0)
MCV: 90.1 fL (ref 80.0–100.0)
MPV: 8.6 fL (ref 7.5–12.5)
PLATELETS: 282 10*3/uL (ref 140–400)
RBC: 3.85 MIL/uL (ref 3.80–5.10)
RDW: 14.4 % (ref 11.0–15.0)
WBC: 8.7 10*3/uL (ref 3.8–10.8)

## 2017-06-05 NOTE — Progress Notes (Signed)
Constant top of uterus burning and stinging.    PRENATAL VISIT NOTE  Subjective:  Cynthia [name] is a 36 y.o. G1P0 at 48w0dbeing seen today for ongoing prenatal care.  She is currently monitored for the following issues for this high-risk pregnancy and has Hyperlipidemia; Severe obesity (BMI >= 40) ([place]; Anxiety state; Menometrorrhagia; Anemia; Conjunctival cyst of both eyes; Pelvic adhesive disease; Endometrioma of ovary; History of endometriosis; Supervision of high risk primigravida in second trimester in patient 339years or older at time of delivery; GBS bacteriuria; Gestational diabetes; Twin gestation, dichorionic diamniotic; and Gestational hypertension w/o significant proteinuria in 2nd trimester on her problem list.  Patient reports burning and pain at fundus.  Contractions: Not present. Vag. Bleeding: None.  Movement: Present. Denies leaking of fluid.   The following portions of the patient's history were reviewed and updated as appropriate: allergies, current medications, past family history, past medical history, past social history, past surgical history and problem list. Problem list updated.  Objective:   Vitals:   06/05/17 0939  BP: 124/87  Pulse: (!) 101  Weight: 231 lb (104.8 kg)    Fetal Status: Fetal Heart Rate (bpm): 147/153   Movement: Present     General:  Alert, oriented and cooperative. Patient is in no acute distress.  Skin: Skin is warm and dry. No rash noted.   Cardiovascular: Normal heart rate noted  Respiratory: Normal respiratory effort, no problems with respiration noted  Abdomen: Soft, gravid, appropriate for gestational age. Pain/Pressure: Absent     Pelvic:  Cervical exam deferred        Extremities: Normal range of motion.  Edema: Moderate pitting, indentation subsides rapidly  Mental Status: Normal mood and affect. Normal behavior. Normal judgment and thought content.   Assessment and Plan:  Pregnancy: G1P0 at 325w0d1. Gestational  hypertension without significant proteinuria in third trimester -Given that patient has gestational hypertension, I think it would be best to stop the labetalol. There is no evidence of chronic hypertension. Reviewed several blood pressures back in 2017 and 16 and there were blood pressures in the 110s over 70s. There was one blood pressure that was 140s but this was isolated. The majority were all very low. Patient understands that she will need to be evaluated postpartum to make sure her blood pressure returned to normal.  If blood pressure becomes greater than 160/110 this would be severe preeclampsia and patient will need to be admitted. Blood pressure medications to be administered at that time. This plan was discussed with symmetrical team who concurs at this time. - CBC - Comp Met (CMET) - Protein / creatinine ratio, urine - Continue weekly labs - Continue home blood pressure monitoring - Continue weekly biophysical profiles until 32 weeks.  And switch to twice weekly testing but biophysical profiles may be more convenient given patient has twins. - Growth followed by MFM.  2. Supervision of high risk primigravida in second trimester in patient 35 years or older at time of delivery CBGs have a few elevated but overall well-controlled with diet. We'll continue to monitor through the baby scripts.  Preterm labor symptoms and general obstetric precautions including but not limited to vaginal bleeding, contractions, leaking of fluid and fetal movement were reviewed in detail with the patient. Please refer to After Visit Summary for other counseling recommendations.   Weekly visits  Ke[place]as SacramentoMD

## 2017-06-06 LAB — PROTEIN / CREATININE RATIO, URINE
Creatinine, Urine: 112 mg/dL (ref 20–320)
Protein Creatinine Ratio: 125 mg/g creat (ref 21–161)
Total Protein, Urine: 14 mg/dL (ref 5–24)

## 2017-06-06 LAB — COMPREHENSIVE METABOLIC PANEL
ALT: 12 U/L (ref 6–29)
AST: 18 U/L (ref 10–30)
Albumin: 2.8 g/dL — ABNORMAL LOW (ref 3.6–5.1)
Alkaline Phosphatase: 132 U/L — ABNORMAL HIGH (ref 33–115)
BILIRUBIN TOTAL: 0.4 mg/dL (ref 0.2–1.2)
BUN: 6 mg/dL — AB (ref 7–25)
CHLORIDE: 107 mmol/L (ref 98–110)
CO2: 19 mmol/L — ABNORMAL LOW (ref 20–31)
CREATININE: 0.61 mg/dL (ref 0.50–1.10)
Calcium: 9.1 mg/dL (ref 8.6–10.2)
Glucose, Bld: 65 mg/dL (ref 65–99)
Potassium: 4.1 mmol/L (ref 3.5–5.3)
SODIUM: 139 mmol/L (ref 135–146)
TOTAL PROTEIN: 5.4 g/dL — AB (ref 6.1–8.1)

## 2017-06-07 ENCOUNTER — Other Ambulatory Visit: Payer: Self-pay | Admitting: Obstetrics & Gynecology

## 2017-06-07 ENCOUNTER — Ambulatory Visit (HOSPITAL_COMMUNITY)
Admission: RE | Admit: 2017-06-07 | Discharge: 2017-06-07 | Disposition: A | Discharge status: Home / Self Care | Payer: 59 | Source: Ambulatory Visit | Attending: Obstetrics & Gynecology | Admitting: Obstetrics & Gynecology

## 2017-06-07 ENCOUNTER — Encounter: Payer: Self-pay | Admitting: Advanced Practice Midwife

## 2017-06-07 ENCOUNTER — Encounter (HOSPITAL_COMMUNITY): Payer: Self-pay

## 2017-06-07 DIAGNOSIS — O163 Unspecified maternal hypertension, third trimester: Secondary | ICD-10-CM | POA: Diagnosis not present

## 2017-06-07 DIAGNOSIS — O133 Gestational [pregnancy-induced] hypertension without significant proteinuria, third trimester: Secondary | ICD-10-CM | POA: Diagnosis not present

## 2017-06-07 DIAGNOSIS — O24419 Gestational diabetes mellitus in pregnancy, unspecified control: Secondary | ICD-10-CM

## 2017-06-07 DIAGNOSIS — O09813 Supervision of pregnancy resulting from assisted reproductive technology, third trimester: Secondary | ICD-10-CM | POA: Diagnosis not present

## 2017-06-07 DIAGNOSIS — O30043 Twin pregnancy, dichorionic/diamniotic, third trimester: Secondary | ICD-10-CM | POA: Insufficient documentation

## 2017-06-07 DIAGNOSIS — O09513 Supervision of elderly primigravida, third trimester: Secondary | ICD-10-CM | POA: Diagnosis not present

## 2017-06-07 DIAGNOSIS — O24410 Gestational diabetes mellitus in pregnancy, diet controlled: Secondary | ICD-10-CM | POA: Insufficient documentation

## 2017-06-07 DIAGNOSIS — O99213 Obesity complicating pregnancy, third trimester: Secondary | ICD-10-CM

## 2017-06-07 DIAGNOSIS — Z3A30 30 weeks gestation of pregnancy: Secondary | ICD-10-CM | POA: Insufficient documentation

## 2017-06-13 ENCOUNTER — Encounter (HOSPITAL_COMMUNITY): Payer: Self-pay | Admitting: Student

## 2017-06-13 ENCOUNTER — Ambulatory Visit (INDEPENDENT_AMBULATORY_CARE_PROVIDER_SITE_OTHER): Payer: 59 | Admitting: Obstetrics & Gynecology

## 2017-06-13 ENCOUNTER — Inpatient Hospital Stay (HOSPITAL_COMMUNITY)
Admission: AD | Admit: 2017-06-13 | Discharge: 2017-06-15 | DRG: 781 | Disposition: A | Discharge status: Home / Self Care | Payer: 59 | Source: Ambulatory Visit | Attending: Obstetrics and Gynecology | Admitting: Obstetrics and Gynecology

## 2017-06-13 VITALS — BP 156/113 | HR 111 | Wt 238.0 lb

## 2017-06-13 DIAGNOSIS — E669 Obesity, unspecified: Secondary | ICD-10-CM | POA: Diagnosis not present

## 2017-06-13 DIAGNOSIS — O99213 Obesity complicating pregnancy, third trimester: Secondary | ICD-10-CM

## 2017-06-13 DIAGNOSIS — O133 Gestational [pregnancy-induced] hypertension without significant proteinuria, third trimester: Secondary | ICD-10-CM | POA: Diagnosis not present

## 2017-06-13 DIAGNOSIS — Z87891 Personal history of nicotine dependence: Secondary | ICD-10-CM

## 2017-06-13 DIAGNOSIS — O30043 Twin pregnancy, dichorionic/diamniotic, third trimester: Secondary | ICD-10-CM | POA: Diagnosis not present

## 2017-06-13 DIAGNOSIS — Z7982 Long term (current) use of aspirin: Secondary | ICD-10-CM | POA: Diagnosis not present

## 2017-06-13 DIAGNOSIS — O24410 Gestational diabetes mellitus in pregnancy, diet controlled: Secondary | ICD-10-CM | POA: Diagnosis present

## 2017-06-13 DIAGNOSIS — O1413 Severe pre-eclampsia, third trimester: Secondary | ICD-10-CM | POA: Diagnosis present

## 2017-06-13 DIAGNOSIS — O09512 Supervision of elderly primigravida, second trimester: Secondary | ICD-10-CM

## 2017-06-13 DIAGNOSIS — O09513 Supervision of elderly primigravida, third trimester: Secondary | ICD-10-CM

## 2017-06-13 DIAGNOSIS — Z3A31 31 weeks gestation of pregnancy: Secondary | ICD-10-CM

## 2017-06-13 DIAGNOSIS — O139 Gestational [pregnancy-induced] hypertension without significant proteinuria, unspecified trimester: Secondary | ICD-10-CM

## 2017-06-13 DIAGNOSIS — O132 Gestational [pregnancy-induced] hypertension without significant proteinuria, second trimester: Secondary | ICD-10-CM

## 2017-06-13 DIAGNOSIS — O24419 Gestational diabetes mellitus in pregnancy, unspecified control: Secondary | ICD-10-CM | POA: Diagnosis not present

## 2017-06-13 DIAGNOSIS — R030 Elevated blood-pressure reading, without diagnosis of hypertension: Secondary | ICD-10-CM | POA: Diagnosis present

## 2017-06-13 LAB — COMPREHENSIVE METABOLIC PANEL
ALT: 15 U/L (ref 14–54)
ANION GAP: 7 (ref 5–15)
AST: 21 U/L (ref 15–41)
Albumin: 2.5 g/dL — ABNORMAL LOW (ref 3.5–5.0)
Alkaline Phosphatase: 140 U/L — ABNORMAL HIGH (ref 38–126)
BILIRUBIN TOTAL: 0.4 mg/dL (ref 0.3–1.2)
BUN: 8 mg/dL (ref 6–20)
CHLORIDE: 107 mmol/L (ref 101–111)
CO2: 23 mmol/L (ref 22–32)
Calcium: 9.3 mg/dL (ref 8.9–10.3)
Creatinine, Ser: 0.66 mg/dL (ref 0.44–1.00)
Glucose, Bld: 101 mg/dL — ABNORMAL HIGH (ref 65–99)
POTASSIUM: 3.8 mmol/L (ref 3.5–5.1)
Sodium: 137 mmol/L (ref 135–145)
TOTAL PROTEIN: 6.2 g/dL — AB (ref 6.5–8.1)

## 2017-06-13 LAB — PROTEIN / CREATININE RATIO, URINE
Creatinine, Urine: 59 mg/dL
PROTEIN CREATININE RATIO: 0.14 mg/mg{creat} (ref 0.00–0.15)
TOTAL PROTEIN, URINE: 8 mg/dL

## 2017-06-13 LAB — TYPE AND SCREEN
ABO/RH(D): A POS
ANTIBODY SCREEN: NEGATIVE

## 2017-06-13 LAB — URINALYSIS, ROUTINE W REFLEX MICROSCOPIC
BILIRUBIN URINE: NEGATIVE
Glucose, UA: NEGATIVE mg/dL
Hgb urine dipstick: NEGATIVE
Ketones, ur: NEGATIVE mg/dL
LEUKOCYTES UA: NEGATIVE
NITRITE: NEGATIVE
PH: 7 (ref 5.0–8.0)
Protein, ur: NEGATIVE mg/dL
SPECIFIC GRAVITY, URINE: 1.009 (ref 1.005–1.030)

## 2017-06-13 LAB — CBC
HCT: 33 % — ABNORMAL LOW (ref 36.0–46.0)
HEMOGLOBIN: 11.1 g/dL — AB (ref 12.0–15.0)
MCH: 29.8 pg (ref 26.0–34.0)
MCHC: 33.6 g/dL (ref 30.0–36.0)
MCV: 88.7 fL (ref 78.0–100.0)
PLATELETS: 284 10*3/uL (ref 150–400)
RBC: 3.72 MIL/uL — ABNORMAL LOW (ref 3.87–5.11)
RDW: 13.9 % (ref 11.5–15.5)
WBC: 9.1 10*3/uL (ref 4.0–10.5)

## 2017-06-13 LAB — GLUCOSE, CAPILLARY: GLUCOSE-CAPILLARY: 160 mg/dL — AB (ref 65–99)

## 2017-06-13 LAB — ABO/RH: ABO/RH(D): A POS

## 2017-06-13 MED ORDER — LABETALOL HCL 5 MG/ML IV SOLN
20.0000 mg | INTRAVENOUS | Status: DC | PRN
  Filled 2017-06-13: qty 4

## 2017-06-13 MED ORDER — ASPIRIN EC 81 MG PO TBEC
81.0000 mg | DELAYED_RELEASE_TABLET | Freq: Every day | ORAL | Status: DC
  Administered 2017-06-13 – 2017-06-15 (×3): 81 mg via ORAL
  Filled 2017-06-13 (×4): qty 1

## 2017-06-13 MED ORDER — LABETALOL HCL 5 MG/ML IV SOLN: 20.0000 mg | INTRAVENOUS | Status: DC | PRN

## 2017-06-13 MED ORDER — PRENATAL MULTIVITAMIN CH
1.0000 | ORAL_TABLET | Freq: Every day | ORAL | Status: DC
  Administered 2017-06-14 – 2017-06-15 (×2): 1 via ORAL
  Filled 2017-06-13 (×2): qty 1

## 2017-06-13 MED ORDER — LACTATED RINGERS IV SOLN: INTRAVENOUS | Status: DC | PRN

## 2017-06-13 MED ORDER — BETAMETHASONE SOD PHOS & ACET 6 (3-3) MG/ML IJ SUSP
12.0000 mg | INTRAMUSCULAR | Status: AC
  Administered 2017-06-13 – 2017-06-14 (×2): 12 mg via INTRAMUSCULAR
  Filled 2017-06-13 (×2): qty 2

## 2017-06-13 MED ORDER — ACETAMINOPHEN 325 MG PO TABS
650.0000 mg | ORAL_TABLET | ORAL | Status: DC | PRN
  Administered 2017-06-14: 650 mg via ORAL
  Filled 2017-06-13: qty 2

## 2017-06-13 MED ORDER — HYDRALAZINE HCL 20 MG/ML IJ SOLN: 10.0000 mg | Freq: Once | INTRAMUSCULAR | Status: DC | PRN

## 2017-06-13 MED ORDER — DOCUSATE SODIUM 100 MG PO CAPS
100.0000 mg | ORAL_CAPSULE | Freq: Every day | ORAL | Status: DC
  Administered 2017-06-14 – 2017-06-15 (×2): 100 mg via ORAL
  Filled 2017-06-13 (×2): qty 1

## 2017-06-13 MED ORDER — ZOLPIDEM TARTRATE 5 MG PO TABS
5.0000 mg | ORAL_TABLET | Freq: Every evening | ORAL | Status: DC | PRN
  Administered 2017-06-13: 5 mg via ORAL
  Filled 2017-06-13 (×2): qty 1

## 2017-06-13 MED ORDER — CALCIUM CARBONATE ANTACID 500 MG PO CHEW
2.0000 | CHEWABLE_TABLET | ORAL | Status: DC | PRN
  Administered 2017-06-14 (×2): 400 mg via ORAL
  Filled 2017-06-13 (×2): qty 2

## 2017-06-13 NOTE — Progress Notes (Signed)
Report called to 3rd floor charge nurse. Room assigned to 310. Instructed to bring pt up at 1900.

## 2017-06-13 NOTE — MAU Note (Signed)
Urine in the lab  

## 2017-06-13 NOTE — Progress Notes (Signed)
   PRENATAL VISIT NOTE  Subjective:  Cynthia [name] is a 36 y.o. G1P0 at [redacted]w[redacted]d being seen today for ongoing prenatal care.  She is currently monitored for the following issues for this high-risk pregnancy and has Hyperlipidemia; Severe obesity (BMI >= 40) ([place]); Anxiety state; Menometrorrhagia; Anemia; Conjunctival cyst of both eyes; Pelvic adhesive disease; Endometrioma of ovary; History of endometriosis; Supervision of high risk primigravida in second trimester in patient 50 years or older at time of delivery; GBS bacteriuria; Gestational diabetes; Twin gestation, dichorionic diamniotic; and Gestational hypertension w/o significant proteinuria in 2nd trimester on her problem list.  Patient reports no complaints.  Contractions: Not present. Vag. Bleeding: None.  Movement: Absent. Denies leaking of fluid.   The following portions of the patient's history were reviewed and updated as appropriate: allergies, current medications, past family history, past medical history, past social history, past surgical history and problem list. Problem list updated.  Objective:   Vitals:   06/13/17 1542  BP: (!) 156/113  Pulse: (!) 111  Weight: 238 lb (108 kg)    Fetal Status: Fetal Heart Rate (bpm): 145/143   Movement: Absent     General:  Alert, oriented and cooperative. Patient is in no acute distress.  Skin: Skin is warm and dry. No rash noted.   Cardiovascular: Normal heart rate noted  Respiratory: Normal respiratory effort, no problems with respiration noted  Abdomen: Soft, gravid, appropriate for gestational age. Pain/Pressure: Absent     Pelvic:  Cervical exam deferred        Extremities: Normal range of motion.  Edema: Deep pitting, indentation remains for a short time  Mental Status: Normal mood and affect. Normal behavior. Normal judgment and thought content.   Assessment and Plan:  Pregnancy: G1P0 at [redacted]w[redacted]d  1. [redacted] weeks gestation of pregnancy 2. Dichorionic diamniotic twin pregnancy in  third trimester   3. Supervision of high risk primigravida in second trimester in patient 35 years or older at time of delivery   4. Severe obesity (BMI >= 40) ([facility])   5. Gestational hypertension w/o significant proteinuria in 2nd trimester - to MAU for evaluation of severe BP  Preterm labor symptoms and general obstetric precautions including but not limited to vaginal bleeding, contractions, leaking of fluid and fetal movement were reviewed in detail with the patient. Please refer to After Visit Summary for other counseling recommendations.  No Follow-up on file.   Emily Filbert, MD

## 2017-06-13 NOTE — MAU Note (Signed)
[place] at bedside . Will hold iv and meds for now until more B/P's are documented.

## 2017-06-13 NOTE — MAU Note (Signed)
Pt sent from MD office with elevated BP, denies HA, visual changes, does have swelling in feet which is not new.  Pt denies contractions, bleeding or LOF.

## 2017-06-13 NOTE — H&P (Signed)
HPI Cynthia [name] is a 36 y.o. G1P0 at [redacted]w[redacted]d with di/di twins who presents from the office for blood pressure evaluation. Patient diagnosed with gestational hypertension earlier in pregnancy. Was in office today & had severe range BP. Denies headache, visual disturbance, or epigastric pain. Endorses increase in lower extremity swelling. Positive fetal movement. Patient has had prenatal care at [facility] complicated by [facility]. She received BMZ on 5/31-6/1          OB History    Gravida Para Term Preterm AB Living   1         0   SAB TAB Ectopic Multiple Live Births                      Past Medical History:  Diagnosis Date  . Anxiety   . Depression   . Endometrioma   . Endometriosis   . Enlarged ovary    CYSTITIC LEFT SIDE  . Gestational diabetes   . H/O seasonal allergies   . History of anemia   . History of ovarian cyst    dermoid  . Hyperlipidemia   . Wears glasses          Past Surgical History:  Procedure Laterality Date  . HYSTEROSCOPY N/A 03/06/2016   Procedure: HYSTEROSCOPY WITH POLYPECTOMY AND D & C;  Surgeon: Governor Specking, MD;  Location: [place];  Service: Gynecology;  Laterality: N/A;  . ivf    . LAPAROSCOPIC OVARIAN CYSTECTOMY  2014, 2016  . LAPAROSCOPIC OVARIAN CYSTECTOMY Left 06/17/2015   Procedure: LAPAROSCOPIC OVARIAN CYSTECTOMY, LYSIS OF ADHESIONS;  Surgeon: Arvella Nigh, MD;  Location: [place];  Service: Gynecology;  Laterality: Left;  . LAPAROSCOPY OVARIAN CYSTECTOMY  2013   dermoid  . OVARIAN CYST REMOVAL           Family History  Problem Relation Age of Onset  . Cancer Mother        breast  . Diabetes Mother        and mother's side of family  . Hyperlipidemia Mother   . Cancer Father        thyroid  . Heart disease Maternal Uncle   . Hyperlipidemia Maternal Grandfather   . Hypertension Maternal Grandfather   . Diabetes Maternal Grandfather             Social History  Substance Use Topics  . Smoking status: Former Smoker    Packs/day: 1.00    Years: 10.00    Types: Cigarettes    Quit date: 11/23/2011  . Smokeless tobacco: Never Used  . Alcohol use Yes      Comment: rare    Allergies: No Known Allergies         Prescriptions Prior to Admission  Medication Sig Dispense Refill Last Dose  . acetaminophen (TYLENOL) 325 MG tablet Take 650 mg by mouth as needed.   Taking  . aspirin EC 81 MG tablet Take 81 mg by mouth daily.   Taking  . cetirizine (ZYRTEC) 10 MG tablet Take 10 mg by mouth daily.   Taking  . diphenhydrAMINE (BENADRYL) 25 MG tablet Take 25 mg by mouth every 6 (six) hours as needed for sleep.   Taking  . Docusate Sodium (COLACE PO) Take by mouth.   Taking  . doxylamine, Sleep, (UNISOM) 25 MG tablet Take 25 mg by mouth at bedtime as needed.   Taking  . glucose blood test strip Use 4 times daily 100 each  12 Taking  . IRON PO Take by mouth.   Taking  . Lancets (FREESTYLE) lancets To use 4 times daily to use with Contour next meter. 100 each 12 Taking  . Omega-3 1000 MG CAPS Take by mouth.   Taking  . prenatal vitamin w/FE, FA (PRENATAL 1 + 1) 27-1 MG TABS tablet Take 1 tablet by mouth daily at 12 noon.    Taking    Review of Systems  Constitutional: Negative.   Eyes: Negative for visual disturbance.  Respiratory: Negative.   Gastrointestinal: Negative.   Genitourinary: Negative.   Neurological: Negative for headaches.   Physical Exam   Blood pressure (!) 158/106, pulse 90, temperature 98 F (36.7 C), temperature source Oral, resp. rate 20, last menstrual period 02/12/2016.  Temp:  [98 F (36.7 C)] 98 F (36.7 C) (07/05 1649) Pulse Rate:  [90-111] 90 (07/05 1746) Resp:  [20] 20 (07/05 1649) BP: (145-163)/(95-113) 158/106 (07/05 1746) Weight:  [238 lb (108 kg)] 238 lb (108 kg) (07/05 1542)  Physical Exam  Nursing note and vitals reviewed. Constitutional: She is oriented  to person, place, and time. She appears well-developed and well-nourished. No distress.  HENT:  [name]: Normocephalic and atraumatic.  Eyes: Conjunctivae are normal. Right eye exhibits no discharge. Left eye exhibits no discharge. No scleral icterus.  Neck: Normal range of motion.  Cardiovascular: Normal rate, regular rhythm and normal heart sounds.   No murmur heard. Respiratory: Effort normal and breath sounds normal. No respiratory distress. She has no wheezes.  GI: Soft. Bowel sounds are normal. There is no tenderness.  Musculoskeletal: She exhibits edema (BLE, 3+ pitting).  Neurological: She is alert and oriented to person, place, and time. She has normal reflexes.  No clonus  Skin: Skin is warm and dry. She is not diaphoretic.  Psychiatric: She has a normal mood and affect. Her behavior is normal. Judgment and thought content normal.   Fetal Tracing: Baby A Baseline: 145 Variability: moderate Accelerations: 15x15 Decelerations:  Baby B Baseline: 140 Variability: moderate Accelerations:15x15 Decelerations:  Initial tracing reactive, then appears to be tracing 1 baby, RN to adjust monitor  Toco: UI  MAU Course  Procedures      Results for orders placed or performed during the hospital encounter of 06/13/17 (from the past 24 hour(s))  Protein / creatinine ratio, urine     Status: None   Collection Time: 06/13/17  4:30 PM  Result Value Ref Range   Creatinine, Urine 59.00 mg/dL   Total Protein, Urine 8 mg/dL   Protein Creatinine Ratio 0.14 0.00 - 0.15 mg/mg[Cre]  Urinalysis, Routine w reflex microscopic     Status: None   Collection Time: 06/13/17  4:30 PM  Result Value Ref Range   Color, Urine YELLOW YELLOW   APPearance CLEAR CLEAR   Specific Gravity, Urine 1.009 1.005 - 1.030   pH 7.0 5.0 - 8.0   Glucose, UA NEGATIVE NEGATIVE mg/dL   Hgb urine dipstick NEGATIVE NEGATIVE   Bilirubin Urine NEGATIVE NEGATIVE   Ketones, ur NEGATIVE NEGATIVE mg/dL    Protein, ur NEGATIVE NEGATIVE mg/dL   Nitrite NEGATIVE NEGATIVE   Leukocytes, UA NEGATIVE NEGATIVE  Comprehensive metabolic panel     Status: Abnormal   Collection Time: 06/13/17  5:11 PM  Result Value Ref Range   Sodium 137 135 - 145 mmol/L   Potassium 3.8 3.5 - 5.1 mmol/L   Chloride 107 101 - 111 mmol/L   CO2 23 22 - 32 mmol/L   Glucose, Bld 101 (  H) 65 - 99 mg/dL   BUN 8 6 - 20 mg/dL   Creatinine, Ser 0.66 0.44 - 1.00 mg/dL   Calcium 9.3 8.9 - 10.3 mg/dL   Total Protein 6.2 (L) 6.5 - 8.1 g/dL   Albumin 2.5 (L) 3.5 - 5.0 g/dL   AST 21 15 - 41 U/L   ALT 15 14 - 54 U/L   Alkaline Phosphatase 140 (H) 38 - 126 U/L   Total Bilirubin 0.4 0.3 - 1.2 mg/dL   GFR calc non Af Amer >60 >60 mL/min   GFR calc Af Amer >60 >60 mL/min   Anion gap 7 5 - 15  CBC     Status: Abnormal   Collection Time: 06/13/17  5:11 PM  Result Value Ref Range   WBC 9.1 4.0 - 10.5 K/uL   RBC 3.72 (L) 3.87 - 5.11 MIL/uL   Hemoglobin 11.1 (L) 12.0 - 15.0 g/dL   HCT 33.0 (L) 36.0 - 46.0 %   MCV 88.7 78.0 - 100.0 fL   MCH 29.8 26.0 - 34.0 pg   MCHC 33.6 30.0 - 36.0 g/dL   RDW 13.9 11.5 - 15.5 %   Platelets 284 150 - 400 K/uL     Assessment and Plan  36 yo G1P0 with di-di twins at 31.1 weeks with GDMA1 and gestational HTN admitted to rule out preeclampsia - Close monitoring of BP - 24 hour urine collection - Will repeat a course of BMZ - Close monitoring of CBG and may need to initiate glycemic agent given steroid administration - Follow up growth scan tomorrow  - routine antepartum care

## 2017-06-13 NOTE — MAU Provider Note (Signed)
History     CSN: [id number]  Arrival date and time: 06/13/17 1631  First Provider Initiated Contact with Patient 06/13/17 1805   Chief Complaint  Patient presents with  . Hypertension   HPI Cynthia [name] is a 36 y.o. G1P0 at [redacted]w[redacted]d with di/di twins who presents from the office for blood pressure evaluation. Patient diagnosed with gestational hypertension earlier in pregnancy. Was in office today & had severe range BP. Denies headache, visual disturbance, or epigastric pain. Endorses increase in lower extremity swelling. Positive fetal movement.   OB History    Gravida Para Term Preterm AB Living   1         0   SAB TAB Ectopic Multiple Live Births                  Past Medical History:  Diagnosis Date  . Anxiety   . Depression   . Endometrioma   . Endometriosis   . Enlarged ovary    CYSTITIC LEFT SIDE  . Gestational diabetes   . H/O seasonal allergies   . History of anemia   . History of ovarian cyst    dermoid  . Hyperlipidemia   . Wears glasses     Past Surgical History:  Procedure Laterality Date  . HYSTEROSCOPY N/A 03/06/2016   Procedure: HYSTEROSCOPY WITH POLYPECTOMY AND D & C;  Surgeon: Governor Specking, MD;  Location: [place];  Service: Gynecology;  Laterality: N/A;  . ivf    . LAPAROSCOPIC OVARIAN CYSTECTOMY  2014, 2016  . LAPAROSCOPIC OVARIAN CYSTECTOMY Left 06/17/2015   Procedure: LAPAROSCOPIC OVARIAN CYSTECTOMY, LYSIS OF ADHESIONS;  Surgeon: Arvella Nigh, MD;  Location: [place];  Service: Gynecology;  Laterality: Left;  . LAPAROSCOPY OVARIAN CYSTECTOMY  2013   dermoid  . OVARIAN CYST REMOVAL      Family History  Problem Relation Age of Onset  . Cancer Mother        breast  . Diabetes Mother        and mother's side of family  . Hyperlipidemia Mother   . Cancer Father        thyroid  . Heart disease Maternal Uncle   . Hyperlipidemia Maternal Grandfather   . Hypertension Maternal Grandfather   . Diabetes  Maternal Grandfather     Social History  Substance Use Topics  . Smoking status: Former Smoker    Packs/day: 1.00    Years: 10.00    Types: Cigarettes    Quit date: 11/23/2011  . Smokeless tobacco: Never Used  . Alcohol use Yes     Comment: rare    Allergies: No Known Allergies  Prescriptions Prior to Admission  Medication Sig Dispense Refill Last Dose  . acetaminophen (TYLENOL) 325 MG tablet Take 650 mg by mouth as needed.   Taking  . aspirin EC 81 MG tablet Take 81 mg by mouth daily.   Taking  . cetirizine (ZYRTEC) 10 MG tablet Take 10 mg by mouth daily.   Taking  . diphenhydrAMINE (BENADRYL) 25 MG tablet Take 25 mg by mouth every 6 (six) hours as needed for sleep.   Taking  . Docusate Sodium (COLACE PO) Take by mouth.   Taking  . doxylamine, Sleep, (UNISOM) 25 MG tablet Take 25 mg by mouth at bedtime as needed.   Taking  . glucose blood test strip Use 4 times daily 100 each 12 Taking  . IRON PO Take by mouth.   Taking  . Lancets (  FREESTYLE) lancets To use 4 times daily to use with Contour next meter. 100 each 12 Taking  . Omega-3 1000 MG CAPS Take by mouth.   Taking  . prenatal vitamin w/FE, FA (PRENATAL 1 + 1) 27-1 MG TABS tablet Take 1 tablet by mouth daily at 12 noon.    Taking    Review of Systems  Constitutional: Negative.   Eyes: Negative for visual disturbance.  Respiratory: Negative.   Gastrointestinal: Negative.   Genitourinary: Negative.   Neurological: Negative for headaches.   Physical Exam   Blood pressure (!) 158/106, pulse 90, temperature 98 F (36.7 C), temperature source Oral, resp. rate 20, last menstrual period 02/12/2016.  Temp:  [98 F (36.7 C)] 98 F (36.7 C) (07/05 1649) Pulse Rate:  [90-111] 90 (07/05 1746) Resp:  [20] 20 (07/05 1649) BP: (145-163)/(95-113) 158/106 (07/05 1746) Weight:  [238 lb (108 kg)] 238 lb (108 kg) (07/05 1542)  Physical Exam  Nursing note and vitals reviewed. Constitutional: She is oriented to person, place, and  time. She appears well-developed and well-nourished. No distress.  HENT:  Head: Normocephalic and atraumatic.  Eyes: Conjunctivae are normal. Right eye exhibits no discharge. Left eye exhibits no discharge. No scleral icterus.  Neck: Normal range of motion.  Cardiovascular: Normal rate, regular rhythm and normal heart sounds.   No murmur heard. Respiratory: Effort normal and breath sounds normal. No respiratory distress. She has no wheezes.  GI: Soft. Bowel sounds are normal. There is no tenderness.  Musculoskeletal: She exhibits edema (BLE, 3+ pitting).  Neurological: She is alert and oriented to person, place, and time. She has normal reflexes.  No clonus  Skin: Skin is warm and dry. She is not diaphoretic.  Psychiatric: She has a normal mood and affect. Her behavior is normal. Judgment and thought content normal.   Fetal Tracing: Baby A Baseline: 145 Variability: moderate Accelerations: 15x15 Decelerations:  Baby B Baseline: 140 Variability: moderate Accelerations:15x15 Decelerations:  Initial tracing reactive, then appears to be tracing 1 baby, RN to adjust monitor  Toco: UI  MAU Course  Procedures Results for orders placed or performed during the hospital encounter of 06/13/17 (from the past 24 hour(s))  Protein / creatinine ratio, urine     Status: None   Collection Time: 06/13/17  4:30 PM  Result Value Ref Range   Creatinine, Urine 59.00 mg/dL   Total Protein, Urine 8 mg/dL   Protein Creatinine Ratio 0.14 0.00 - 0.15 mg/mg[Cre]  Urinalysis, Routine w reflex microscopic     Status: None   Collection Time: 06/13/17  4:30 PM  Result Value Ref Range   Color, Urine YELLOW YELLOW   APPearance CLEAR CLEAR   Specific Gravity, Urine 1.009 1.005 - 1.030   pH 7.0 5.0 - 8.0   Glucose, UA NEGATIVE NEGATIVE mg/dL   Hgb urine dipstick NEGATIVE NEGATIVE   Bilirubin Urine NEGATIVE NEGATIVE   Ketones, ur NEGATIVE NEGATIVE mg/dL   Protein, ur NEGATIVE NEGATIVE mg/dL   Nitrite  NEGATIVE NEGATIVE   Leukocytes, UA NEGATIVE NEGATIVE  Comprehensive metabolic panel     Status: Abnormal   Collection Time: 06/13/17  5:11 PM  Result Value Ref Range   Sodium 137 135 - 145 mmol/L   Potassium 3.8 3.5 - 5.1 mmol/L   Chloride 107 101 - 111 mmol/L   CO2 23 22 - 32 mmol/L   Glucose, Bld 101 (H) 65 - 99 mg/dL   BUN 8 6 - 20 mg/dL   Creatinine, Ser 0.66 0.44 -  1.00 mg/dL   Calcium 9.3 8.9 - 10.3 mg/dL   Total Protein 6.2 (L) 6.5 - 8.1 g/dL   Albumin 2.5 (L) 3.5 - 5.0 g/dL   AST 21 15 - 41 U/L   ALT 15 14 - 54 U/L   Alkaline Phosphatase 140 (H) 38 - 126 U/L   Total Bilirubin 0.4 0.3 - 1.2 mg/dL   GFR calc non Af Amer >60 >60 mL/min   GFR calc Af Amer >60 >60 mL/min   Anion gap 7 5 - 15  CBC     Status: Abnormal   Collection Time: 06/13/17  5:11 PM  Result Value Ref Range   WBC 9.1 4.0 - 10.5 K/uL   RBC 3.72 (L) 3.87 - 5.11 MIL/uL   Hemoglobin 11.1 (L) 12.0 - 15.0 g/dL   HCT 33.0 (L) 36.0 - 46.0 %   MCV 88.7 78.0 - 100.0 fL   MCH 29.8 26.0 - 34.0 pg   MCHC 33.6 30.0 - 36.0 g/dL   RDW 13.9 11.5 - 15.5 %   Platelets 284 150 - 400 K/uL    MDM Cycle BPs -- severe range BP x1 -- labetalol protocol ordered for further severe range BPs CBC, CMP, urine PCR Dr. Elly Modena on unit to speak with patient about admission Patient received BMZ 5/31 & 6/1  Assessment and Plan  A: Hypertension in pregnancy  P: Admit per Dr. Gentry Roch 06/13/2017, 5:25 PM

## 2017-06-14 ENCOUNTER — Ambulatory Visit (HOSPITAL_COMMUNITY)
Admission: RE | Admit: 2017-06-14 | Discharge: 2017-06-14 | Disposition: A | Discharge status: Home / Self Care | Payer: 59 | Source: Ambulatory Visit | Attending: Obstetrics & Gynecology | Admitting: Obstetrics & Gynecology

## 2017-06-14 DIAGNOSIS — O30043 Twin pregnancy, dichorionic/diamniotic, third trimester: Secondary | ICD-10-CM | POA: Insufficient documentation

## 2017-06-14 DIAGNOSIS — O09513 Supervision of elderly primigravida, third trimester: Secondary | ICD-10-CM | POA: Insufficient documentation

## 2017-06-14 DIAGNOSIS — O24419 Gestational diabetes mellitus in pregnancy, unspecified control: Secondary | ICD-10-CM | POA: Insufficient documentation

## 2017-06-14 DIAGNOSIS — O133 Gestational [pregnancy-induced] hypertension without significant proteinuria, third trimester: Secondary | ICD-10-CM | POA: Insufficient documentation

## 2017-06-14 DIAGNOSIS — Z3A31 31 weeks gestation of pregnancy: Secondary | ICD-10-CM | POA: Insufficient documentation

## 2017-06-14 DIAGNOSIS — O99213 Obesity complicating pregnancy, third trimester: Secondary | ICD-10-CM | POA: Diagnosis not present

## 2017-06-14 DIAGNOSIS — E669 Obesity, unspecified: Secondary | ICD-10-CM | POA: Insufficient documentation

## 2017-06-14 DIAGNOSIS — O132 Gestational [pregnancy-induced] hypertension without significant proteinuria, second trimester: Secondary | ICD-10-CM

## 2017-06-14 DIAGNOSIS — O09523 Supervision of elderly multigravida, third trimester: Secondary | ICD-10-CM

## 2017-06-14 DIAGNOSIS — O24410 Gestational diabetes mellitus in pregnancy, diet controlled: Secondary | ICD-10-CM

## 2017-06-14 DIAGNOSIS — O30049 Twin pregnancy, dichorionic/diamniotic, unspecified trimester: Secondary | ICD-10-CM

## 2017-06-14 LAB — GLUCOSE, CAPILLARY
GLUCOSE-CAPILLARY: 130 mg/dL — AB (ref 65–99)
Glucose-Capillary: 101 mg/dL — ABNORMAL HIGH (ref 65–99)
Glucose-Capillary: 103 mg/dL — ABNORMAL HIGH (ref 65–99)
Glucose-Capillary: 107 mg/dL — ABNORMAL HIGH (ref 65–99)
Glucose-Capillary: 146 mg/dL — ABNORMAL HIGH (ref 65–99)

## 2017-06-14 LAB — PROTEIN, URINE, 24 HOUR
Collection Interval-UPROT: 24 hours
PROTEIN, 24H URINE: 151 mg/d — AB (ref 50–100)
PROTEIN, URINE: 14 mg/dL
Urine Total Volume-UPROT: 1075 mL

## 2017-06-14 MED ORDER — ZOLPIDEM TARTRATE 5 MG PO TABS
5.0000 mg | ORAL_TABLET | Freq: Once | ORAL | Status: AC
  Administered 2017-06-14: 5 mg via ORAL

## 2017-06-14 MED ORDER — LABETALOL HCL 200 MG PO TABS
200.0000 mg | ORAL_TABLET | Freq: Two times a day (BID) | ORAL | Status: DC
Start: 2017-06-14 — End: 2017-06-15
  Administered 2017-06-14 – 2017-06-15 (×3): 200 mg via ORAL
  Filled 2017-06-14 (×2): qty 1
  Filled 2017-06-14: qty 2

## 2017-06-14 MED ORDER — HYDROXYZINE HCL 25 MG PO TABS
25.0000 mg | ORAL_TABLET | Freq: Every evening | ORAL | Status: DC | PRN
  Administered 2017-06-14: 25 mg via ORAL
  Filled 2017-06-14 (×2): qty 1

## 2017-06-14 MED ORDER — SODIUM CHLORIDE 0.9% FLUSH
3.0000 mL | Freq: Two times a day (BID) | INTRAVENOUS | Status: DC
  Administered 2017-06-14 – 2017-06-15 (×2): 3 mL via INTRAVENOUS

## 2017-06-14 MED ORDER — FAMOTIDINE 20 MG PO TABS
20.0000 mg | ORAL_TABLET | Freq: Every day | ORAL | Status: DC
  Administered 2017-06-14 – 2017-06-15 (×2): 20 mg via ORAL
  Filled 2017-06-14 (×2): qty 1

## 2017-06-14 MED ORDER — SODIUM CHLORIDE 0.9% FLUSH: 3.0000 mL | INTRAVENOUS | Status: DC | PRN

## 2017-06-14 NOTE — Addendum Note (Signed)
Encounter addended by: Hilda Lias, RT on: 06/14/2017 12:52 PM<BR>    Actions taken: Imaging Exam ended

## 2017-06-14 NOTE — Progress Notes (Signed)
Pt sitting up to eat breakfast 

## 2017-06-14 NOTE — Progress Notes (Signed)
   06/14/17 1240  PIH   PIH (WDL) X  Headache present No  Visual Disturbance None  Epigastric pain None  Atypical edema present? Yes  Reflexes Plus 1  Clonus Absent  Pt returned via w/c from u/s.  BP at that time 156/96.  See above assessment.  Will recheck in 7mins.   Pt also states she voided scant amt (secondary to pressure on abdm during u/s exam) while at u/s & did not keep it.  Will notify MD.

## 2017-06-14 NOTE — Progress Notes (Signed)
Dr Rip Harbour in dept & notified of previous BP/assessment & repeat BP of 141/90.  Report also given re: pt voiding & not saving for 24hr urine.  No new orders rec'd.  MD also reviewed BPP results.

## 2017-06-14 NOTE — Progress Notes (Signed)
Patient ID: Cynthia [name], female   DOB: 1981/04/15, 36 y.o.   MRN: [id number] [place] COMPREHENSIVE PROGRESS NOTE  Cynthia [name] is a 36 y.o. G1P0 at [redacted]w[redacted]d  who is admitted for rule out PEC.   Fetal presentation is cephalic/cephalic Length of Stay:  1  Days  Subjective: Pt is without complaints today. Reports good fetal movement x 2. Denies Ut ctx. Denies HA or visual changes   Vitals:  Blood pressure (!) 141/90, pulse (!) 110, temperature 97.6 F (36.4 C), temperature source Oral, resp. rate 16, last menstrual period 02/12/2016, SpO2 100 %. Physical Examination: Lungs clear Heart RRR Abd soft + BS gravid    Labs:  Results for orders placed or performed during the hospital encounter of 06/13/17 (from the past 24 hour(s))  Protein / creatinine ratio, urine   Collection Time: 06/13/17  4:30 PM  Result Value Ref Range   Creatinine, Urine 59.00 mg/dL   Total Protein, Urine 8 mg/dL   Protein Creatinine Ratio 0.14 0.00 - 0.15 mg/mg[Cre]  Urinalysis, Routine w reflex microscopic   Collection Time: 06/13/17  4:30 PM  Result Value Ref Range   Color, Urine YELLOW YELLOW   APPearance CLEAR CLEAR   Specific Gravity, Urine 1.009 1.005 - 1.030   pH 7.0 5.0 - 8.0   Glucose, UA NEGATIVE NEGATIVE mg/dL   Hgb urine dipstick NEGATIVE NEGATIVE   Bilirubin Urine NEGATIVE NEGATIVE   Ketones, ur NEGATIVE NEGATIVE mg/dL   Protein, ur NEGATIVE NEGATIVE mg/dL   Nitrite NEGATIVE NEGATIVE   Leukocytes, UA NEGATIVE NEGATIVE  Comprehensive metabolic panel   Collection Time: 06/13/17  5:11 PM  Result Value Ref Range   Sodium 137 135 - 145 mmol/L   Potassium 3.8 3.5 - 5.1 mmol/L   Chloride 107 101 - 111 mmol/L   CO2 23 22 - 32 mmol/L   Glucose, Bld 101 (H) 65 - 99 mg/dL   BUN 8 6 - 20 mg/dL   Creatinine, Ser 0.66 0.44 - 1.00 mg/dL   Calcium 9.3 8.9 - 10.3 mg/dL   Total Protein 6.2 (L) 6.5 - 8.1 g/dL   Albumin 2.5 (L) 3.5 - 5.0 g/dL   AST 21 15 - 41 U/L   ALT 15 14 - 54  U/L   Alkaline Phosphatase 140 (H) 38 - 126 U/L   Total Bilirubin 0.4 0.3 - 1.2 mg/dL   GFR calc non Af Amer >60 >60 mL/min   GFR calc Af Amer >60 >60 mL/min   Anion gap 7 5 - 15  CBC   Collection Time: 06/13/17  5:11 PM  Result Value Ref Range   WBC 9.1 4.0 - 10.5 K/uL   RBC 3.72 (L) 3.87 - 5.11 MIL/uL   Hemoglobin 11.1 (L) 12.0 - 15.0 g/dL   HCT 33.0 (L) 36.0 - 46.0 %   MCV 88.7 78.0 - 100.0 fL   MCH 29.8 26.0 - 34.0 pg   MCHC 33.6 30.0 - 36.0 g/dL   RDW 13.9 11.5 - 15.5 %   Platelets 284 150 - 400 K/uL  Type and screen [place]   Collection Time: 06/13/17  6:43 PM  Result Value Ref Range   ABO/RH(D) A POS    Antibody Screen NEG    Sample Expiration 06/16/2017   ABO/Rh   Collection Time: 06/13/17  6:43 PM  Result Value Ref Range   ABO/RH(D) A POS   Glucose, capillary   Collection Time: 06/13/17 10:15 PM  Result Value Ref  Range   Glucose-Capillary 160 (H) 65 - 99 mg/dL  Glucose, capillary   Collection Time: 06/14/17  4:18 AM  Result Value Ref Range   Glucose-Capillary 130 (H) 65 - 99 mg/dL  Glucose, capillary   Collection Time: 06/14/17  7:50 AM  Result Value Ref Range   Glucose-Capillary 101 (H) 65 - 99 mg/dL  Glucose, capillary   Collection Time: 06/14/17 11:22 AM  Result Value Ref Range   Glucose-Capillary 103 (H) 65 - 99 mg/dL    Imaging Studies:    U/S today   Medications:  Scheduled . aspirin EC  81 mg Oral Daily  . betamethasone acetate-betamethasone sodium phosphate  12 mg Intramuscular Q24H  . docusate sodium  100 mg Oral Daily  . labetalol  200 mg Oral BID  . prenatal multivitamin  1 tablet Oral Q1200  . sodium chloride flush  3 mL Intravenous Q12H   I have reviewed the patient's current medications.  ASSESSMENT: IUP 31 2/7 weeks Twin gestation Di/Di GDM, diet control GHTN vs PEC AMA   PLAN: Stable. BP's still liable. Will start Labetalol bid.U/S good growth but 6/8 for twin B (-2 for breathing). Will repeat  tomorrow. Completed BMZ tonight. BS stable thus far. Will continue to monitor. 24 hr urine being collected.  POC reviewed with pt and husband.     Chancy Milroy 06/14/2017,2:58 PM

## 2017-06-15 ENCOUNTER — Inpatient Hospital Stay (HOSPITAL_COMMUNITY): Payer: 59

## 2017-06-15 DIAGNOSIS — O133 Gestational [pregnancy-induced] hypertension without significant proteinuria, third trimester: Principal | ICD-10-CM

## 2017-06-15 LAB — GLUCOSE, CAPILLARY
Glucose-Capillary: 141 mg/dL — ABNORMAL HIGH (ref 65–99)
Glucose-Capillary: 135 mg/dL — ABNORMAL HIGH (ref 65–99)

## 2017-06-15 NOTE — Discharge Summary (Signed)
Physician Discharge Summary  Patient ID: Cynthia [name] MRN: [id number] DOB/AGE: [birth year] 36 y.o.  Admit date: 06/13/2017 Discharge date: 06/15/2017  Admission Diagnoses: Patient Active Problem List   Diagnosis Date Noted  . Gestational hypertension 06/13/2017  . Gestational hypertension w/o significant proteinuria in 2nd trimester 05/28/2017  . Twin gestation, dichorionic diamniotic 05/07/2017  . Gestational diabetes 01/23/2017  . GBS bacteriuria 01/19/2017  . History of endometriosis 01/16/2017  . Supervision of high risk primigravida in second trimester in patient 35 years or older at time of delivery 01/16/2017  . Pelvic adhesive disease 06/17/2015    Class: Present on Admission  . Endometrioma of ovary 06/17/2015    Class: Present on Admission  . Conjunctival cyst of both eyes 06/06/2015  . Menometrorrhagia 12/20/2014  . Anemia 12/20/2014  . Hyperlipidemia 11/22/2014  . Severe obesity (BMI >= 40) ([place]) 11/22/2014  . Anxiety state 11/22/2014     Discharge Diagnoses:  Active Problems:   Gestational hypertension   Discharged Condition: good  Hospital Course: HPI Cynthia [name] a 36 y.o.G1P0 at [redacted]w[redacted]d with di/di twinswho presents from the office for blood pressure evaluation. Patient diagnosed with gestational hypertension earlier in pregnancy. Was in office today &had severe range BP. Denies headache, visual disturbance, or epigastric pain. Endorses increase in lower extremity swelling. Positive fetal movement. Patient has had [facility] at [facility] complicated by [facility]. She received BMZ on 5/31-6/1          OB History   Gravida Para Term Preterm AB Living   1     0   SAB TAB Ectopic Multiple Live Births                 Past Medical History:  Diagnosis Date  . Anxiety   . Depression   . Endometrioma   . Endometriosis   . Enlarged ovary    CYSTITIC LEFT SIDE  . Gestational diabetes   . H/O seasonal allergies   .  History of anemia   . History of ovarian cyst    dermoid  . Hyperlipidemia   . Wears glasses          Past Surgical History:  Procedure Laterality Date  . HYSTEROSCOPY N/A 03/06/2016   Procedure: HYSTEROSCOPY WITH POLYPECTOMY AND D &C; Surgeon: Governor Specking, MD; Location: [facility]; Service: Gynecology; Laterality: N/A;  . ivf    . LAPAROSCOPIC OVARIAN CYSTECTOMY  2014, 2016  . LAPAROSCOPIC OVARIAN CYSTECTOMY Left 06/17/2015   Procedure: LAPAROSCOPIC OVARIAN CYSTECTOMY, LYSIS OF ADHESIONS; Surgeon: Arvella Nigh, MD; Location: [facility]; Service: Gynecology; Laterality: Left;  . LAPAROSCOPY OVARIAN CYSTECTOMY  2013   dermoid  . OVARIAN CYST REMOVAL           Family History  Problem Relation Age of Onset  . Cancer Mother    breast  . Diabetes Mother    and mother's side of family  . Hyperlipidemia Mother   . Cancer Father    thyroid  . Heart disease Maternal Uncle   . Hyperlipidemia Maternal Grandfather   . Hypertension Maternal Grandfather   . Diabetes Maternal Grandfather            Social History  Substance Use Topics  . Smoking status: Former Smoker    Packs/day: 1.00    Years: 10.00    Types: Cigarettes    Quit date: 11/23/2011  . Smokeless tobacco: Never Used  . Alcohol use Yes      Comment: rare  Allergies: No Known Allergies         Prescriptions Prior to Admission  Medication Sig Dispense Refill Last Dose  . acetaminophen (TYLENOL) 325 MG tablet Take 650 mg by mouth as needed.   Taking  . aspirin EC 81 MG tablet Take 81 mg by mouth daily.   Taking  . cetirizine (ZYRTEC) 10 MG tablet Take 10 mg by mouth daily.   Taking  . diphenhydrAMINE (BENADRYL) 25 MG tablet Take 25 mg by mouth every 6 (six) hours as needed for sleep.   Taking  . Docusate Sodium (COLACE PO) Take by mouth.   Taking  . doxylamine, Sleep, (UNISOM) 25 MG  tablet Take 25 mg by mouth at bedtime as needed.   Taking  . glucose blood test strip Use 4 times daily 100 each 12 Taking  . IRON PO Take by mouth.   Taking  . Lancets (FREESTYLE) lancets To use 4 times daily to use with Contour next meter. 100 each 12 Taking  . Omega-3 1000 MG CAPS Take by mouth.   Taking  . prenatal vitamin w/FE, FA (PRENATAL 1 + 1) 27-1 MG TABS tablet Take 1 tablet by mouth daily at 12 noon.    Taking    Review of Systems  Constitutional: Negative.  Eyes: Negative for visual disturbance.  Respiratory: Negative.  Gastrointestinal: Negative.  Genitourinary: Negative.  Neurological: Negative for headaches.   Physical Exam   Blood pressure (!) 158/106, pulse 90, temperature 98 F (36.7 C), temperature source Oral, resp. rate 20, last menstrual period 02/12/2016.  Temp: [98 F (36.7 C)] 98 F (36.7 C) (07/05 1649) Pulse Rate: [90-111] 90 (07/05 1746) Resp: [20] 20 (07/05 1649) BP: (145-163)/(95-113) 158/106 (07/05 1746) Weight: [238 lb (108 kg)] 238 lb (108 kg) (07/05 1542)  Physical Exam Nursing noteand vitalsreviewed. Constitutional: She is oriented to person, place, and time. She appears well-developedand well-nourished. No distress.  HENT:  Head: Normocephalicand atraumatic.  Eyes: Conjunctivaeare normal. Right eye exhibits no discharge. Left eye exhibits no discharge. No scleral icterus.  Neck: Normal range of motion.  Cardiovascular: Normal rate, regular rhythmand normal heart sounds.  No murmurheard. Respiratory: Effort normaland breath sounds normal. No respiratory distress. She has no wheezes.  GI: Soft. Bowel sounds are normal. There is no tenderness.  Musculoskeletal: She exhibits edema(BLE, 3+ pitting).  Neurological: She is alertand oriented to person, place, and time. She has normal reflexes.  No clonus Skin: Skin is warmand dry. She is not diaphoretic.  Psychiatric: She has a normal mood and affect. Her  behavior is normal. Judgmentand thought contentnormal.   Fetal Tracing: Baby A Baseline: 145 Variability: moderate Accelerations: 15x15 Decelerations:  Baby B Baseline: 140 Variability: moderate Accelerations:15x15 Decelerations:  Initial tracing reactive, then appears to be tracing 1 baby, RN to adjust monitor  [place]: UI  MAU Course  Procedures      Results for orders placed or performed during the hospital encounter of 06/13/17 (from the past 24 hour(s))  Protein / creatinine ratio, urine Status: None   Collection Time: 06/13/17 4:30 PM  Result Value Ref Range   Creatinine, Urine 59.00 mg/dL   Total Protein, Urine 8 mg/dL   Protein Creatinine Ratio 0.14 0.00 - 0.15 mg/mg[Cre]  Urinalysis, Routine w reflex microscopic Status: None   Collection Time: 06/13/17 4:30 PM  Result Value Ref Range   Color, Urine YELLOW YELLOW   APPearance CLEAR CLEAR   Specific Gravity, Urine 1.009 1.005 - 1.030   pH 7.0 5.0 - 8.0  Glucose, UA NEGATIVE NEGATIVE mg/dL   Hgb urine dipstick NEGATIVE NEGATIVE   Bilirubin Urine NEGATIVE NEGATIVE   Ketones, ur NEGATIVE NEGATIVE mg/dL   Protein, ur NEGATIVE NEGATIVE mg/dL   Nitrite NEGATIVE NEGATIVE   Leukocytes, UA NEGATIVE NEGATIVE  Comprehensive metabolic panel Status: Abnormal   Collection Time: 06/13/17 5:11 PM  Result Value Ref Range   Sodium 137 135 - 145 mmol/L   Potassium 3.8 3.5 - 5.1 mmol/L   Chloride 107 101 - 111 mmol/L   CO2 23 22 - 32 mmol/L   Glucose, Bld 101 (H) 65 - 99 mg/dL   BUN 8 6 - 20 mg/dL   Creatinine, Ser 0.66 0.44 - 1.00 mg/dL   Calcium 9.3 8.9 - 10.3 mg/dL   Total Protein 6.2 (L) 6.5 - 8.1 g/dL   Albumin 2.5 (L) 3.5 - 5.0 g/dL   AST 21 15 - 41 U/L   ALT 15 14 - 54 U/L   Alkaline Phosphatase 140 (H) 38 - 126 U/L   Total Bilirubin 0.4 0.3 - 1.2 mg/dL   GFR calc non Af Amer >60 >60 mL/min   GFR calc Af Amer >60 >60 mL/min   Anion gap 7 5 - 15  CBC  Status: Abnormal   Collection Time: 06/13/17 5:11 PM  Result Value Ref Range   WBC 9.1 4.0 - 10.5 K/uL   RBC 3.72 (L) 3.87 - 5.11 MIL/uL   Hemoglobin 11.1 (L) 12.0 - 15.0 g/dL   HCT 33.0 (L) 36.0 - 46.0 %   MCV 88.7 78.0 - 100.0 fL   MCH 29.8 26.0 - 34.0 pg   MCHC 33.6 30.0 - 36.0 g/dL   RDW 13.9 11.5 - 15.5 %   Platelets 284 150 - 400 K/uL     Assessment and Plan  36 yo G1P0 with di-di twins at 31.1 weeks with GDMA1 and gestational HTN admitted to rule out preeclampsia - Close monitoring of BP - 24 hour urine collection - Will repeat a course of BMZ - Close monitoring of CBG and may need to initiate glycemic agent given steroid administration - Follow up growth scan tomorrow  - routine antepartum care CBG (last 3)   Recent Labs  06/14/17 2042 06/15/17 0557 06/15/17 1004  GLUCAP 146* 141* 135*    BPP on 06/15/17 8/8, 8/8  Consults: None  Significant Diagnostic Studies: BPP  Treatments: blood glucose management  Discharge Exam: Blood pressure 129/80, pulse (!) 102, temperature 98.1 F (36.7 C), temperature source Oral, resp. rate 16, height 5\' 2"  (1.575 m), weight 108 kg (238 lb), last menstrual period 02/12/2016, SpO2 100 %. General appearance: alert, cooperative and no distress  Disposition: 01-Home or Self Care  Discharge Instructions    Discharge activity:  No Restrictions    Complete by:  As directed    Discharge diet:    Complete by:  As directed    Discharge instructions    Complete by:  As directed    F/u in 5 days in [place] Count:  Lie on our left side for one hour after a meal, and count the number of times your baby kicks.  If it is less than 5 times, get up, move around and drink some juice.  Repeat the test 30 minutes later.  If it is still less than 5 kicks in an hour, notify your doctor.    Complete by:  As directed      Allergies as of 06/15/2017   No  Known Allergies     Medication List    TAKE these  medications   acetaminophen 500 MG tablet Commonly known as:  TYLENOL Take 1,000 mg by mouth every 6 (six) hours as needed.   aspirin EC 81 MG tablet Take 81 mg by mouth daily.   BIOFREEZE EX Apply 1 application topically as needed.   COLACE PO Take by mouth.   doxylamine (Sleep) 25 MG tablet Commonly known as:  UNISOM Take 25 mg by mouth at bedtime as needed.   freestyle lancets To use 4 times daily to use with Contour next meter.   glucose blood test strip Use 4 times daily   IRON PO Take by mouth.   Omega-3 1000 MG Caps Take by mouth.   prenatal vitamin w/FE, FA 27-1 MG Tabs tablet Take 1 tablet by mouth daily at 12 noon.      Follow-up [place] for [facility] [place] Follow up in 5 day(s).   Specialty:  Obstetrics and Gynecology Contact information: [place], [place] [phone]          Signed: Emeterio Reeve 06/15/2017, 1:13 PM

## 2017-06-15 NOTE — Progress Notes (Addendum)
Discharge teaching complete with pt. Pt understood all instructions and did not have any questions. Baby A and Baby B 8/8 on BPP. MD stated no more monitoring at this time. Pt ambulated out of the hospital and discharged home to family.

## 2017-06-17 ENCOUNTER — Other Ambulatory Visit: Payer: Self-pay | Admitting: *Deleted

## 2017-06-17 DIAGNOSIS — O24419 Gestational diabetes mellitus in pregnancy, unspecified control: Secondary | ICD-10-CM

## 2017-06-20 ENCOUNTER — Ambulatory Visit (INDEPENDENT_AMBULATORY_CARE_PROVIDER_SITE_OTHER): Payer: 59 | Admitting: Obstetrics & Gynecology

## 2017-06-20 VITALS — BP 140/87

## 2017-06-20 DIAGNOSIS — O24419 Gestational diabetes mellitus in pregnancy, unspecified control: Secondary | ICD-10-CM

## 2017-06-20 DIAGNOSIS — O30043 Twin pregnancy, dichorionic/diamniotic, third trimester: Secondary | ICD-10-CM | POA: Diagnosis not present

## 2017-06-20 DIAGNOSIS — O30009 Twin pregnancy, unspecified number of placenta and unspecified number of amniotic sacs, unspecified trimester: Secondary | ICD-10-CM

## 2017-06-20 NOTE — Progress Notes (Signed)
   PRENATAL VISIT NOTE  Subjective:  Cynthia [name] is a 36 y.o. G1P0 at [redacted]w[redacted]d being seen today for ongoing prenatal care.  She is currently monitored for the following issues for this high-risk pregnancy and has Hyperlipidemia; Severe obesity (BMI >= 40) ([place]); Anxiety state; Menometrorrhagia; Anemia; Conjunctival cyst of both eyes; Pelvic adhesive disease; Endometrioma of ovary; History of endometriosis; Supervision of high risk primigravida in second trimester in patient 9 years or older at time of delivery; GBS bacteriuria; Gestational diabetes; Twin gestation, dichorionic diamniotic; Gestational hypertension w/o significant proteinuria in 2nd trimester; and Gestational hypertension on her problem list.  Patient reports no complaints.   .  .   . Denies leaking of fluid.   The following portions of the patient's history were reviewed and updated as appropriate: allergies, current medications, past family history, past medical history, past social history, past surgical history and problem list. Problem list updated.  140/87 Neg Neg  Fetal Status:         NST 130 (BAby A and B), reactive, moderate variability, no decelerations.  Dificult to trace.  Pt reports better fetal mvmt.  BPP tomorrow.    General:  Alert, oriented and cooperative. Patient is in no acute distress.  Skin: Skin is warm and dry. No rash noted.   Cardiovascular: Normal heart rate noted  Respiratory: Normal respiratory effort, no problems with respiration noted  Abdomen: Soft, gravid, appropriate for gestational age.       Pelvic:  Cervical exam deferred        Extremities: Normal range of motion.     Mental Status: Normal mood and affect. Normal behavior. Normal judgment and thought content.   Assessment and Plan:  Pregnancy: G1P0 at [redacted]w[redacted]d  1.  Gestational HTN (vs chronic HTN):  Pt admitted for severe range BP.  Still no proteinuria.  24 hour urine nnegative.  Received 2nd course betamethasone.  Will not repeat again.   Continue labetalol  2.  CBGs--on Baby[facility]; doing well.  Nml even after betamethasone  3.  Twice weekly testing (or weekly BPP).  Pt has decreased FM today.  NST today  4.  Weekly labs  5.  Growth [place] by MFM Preterm labor symptoms and general obstetric precautions including but not limited to vaginal bleeding, contractions, leaking of fluid and fetal movement were reviewed in detail with the patient. Please refer to After Visit Summary for other counseling recommendations.   Weekly visits.  [name], MD

## 2017-06-21 ENCOUNTER — Ambulatory Visit (HOSPITAL_COMMUNITY)
Admission: RE | Admit: 2017-06-21 | Discharge: 2017-06-21 | Disposition: A | Discharge status: Home / Self Care | Payer: 59 | Source: Ambulatory Visit | Attending: Obstetrics & Gynecology | Admitting: Obstetrics & Gynecology

## 2017-06-21 ENCOUNTER — Encounter (HOSPITAL_COMMUNITY): Payer: Self-pay

## 2017-06-21 DIAGNOSIS — O09813 Supervision of pregnancy resulting from assisted reproductive technology, third trimester: Secondary | ICD-10-CM | POA: Diagnosis not present

## 2017-06-21 DIAGNOSIS — O30043 Twin pregnancy, dichorionic/diamniotic, third trimester: Secondary | ICD-10-CM | POA: Insufficient documentation

## 2017-06-21 DIAGNOSIS — O09513 Supervision of elderly primigravida, third trimester: Secondary | ICD-10-CM | POA: Diagnosis not present

## 2017-06-21 DIAGNOSIS — Z3A32 32 weeks gestation of pregnancy: Secondary | ICD-10-CM | POA: Insufficient documentation

## 2017-06-21 DIAGNOSIS — O99213 Obesity complicating pregnancy, third trimester: Secondary | ICD-10-CM | POA: Insufficient documentation

## 2017-06-21 DIAGNOSIS — O24419 Gestational diabetes mellitus in pregnancy, unspecified control: Secondary | ICD-10-CM | POA: Diagnosis not present

## 2017-06-21 DIAGNOSIS — Z362 Encounter for other antenatal screening follow-up: Secondary | ICD-10-CM | POA: Insufficient documentation

## 2017-06-21 DIAGNOSIS — O24410 Gestational diabetes mellitus in pregnancy, diet controlled: Secondary | ICD-10-CM | POA: Diagnosis not present

## 2017-06-21 LAB — COMPREHENSIVE METABOLIC PANEL
ALK PHOS: 154 U/L — AB (ref 33–115)
ALT: 15 U/L (ref 6–29)
AST: 16 U/L (ref 10–30)
Albumin: 2.9 g/dL — ABNORMAL LOW (ref 3.6–5.1)
BILIRUBIN TOTAL: 0.3 mg/dL (ref 0.2–1.2)
BUN: 8 mg/dL (ref 7–25)
CALCIUM: 8.7 mg/dL (ref 8.6–10.2)
CO2: 20 mmol/L (ref 20–31)
Chloride: 104 mmol/L (ref 98–110)
Creat: 0.58 mg/dL (ref 0.50–1.10)
Glucose, Bld: 90 mg/dL (ref 65–99)
Potassium: 3.9 mmol/L (ref 3.5–5.3)
Sodium: 138 mmol/L (ref 135–146)
TOTAL PROTEIN: 5.3 g/dL — AB (ref 6.1–8.1)

## 2017-06-21 LAB — CBC
HEMATOCRIT: 34.3 % — AB (ref 35.0–45.0)
Hemoglobin: 11 g/dL — ABNORMAL LOW (ref 11.7–15.5)
MCH: 29.4 pg (ref 27.0–33.0)
MCHC: 32.1 g/dL (ref 32.0–36.0)
MCV: 91.7 fL (ref 80.0–100.0)
MPV: 8.9 fL (ref 7.5–12.5)
Platelets: 292 10*3/uL (ref 140–400)
RBC: 3.74 MIL/uL — AB (ref 3.80–5.10)
RDW: 14.1 % (ref 11.0–15.0)
WBC: 8.3 10*3/uL (ref 3.8–10.8)

## 2017-06-21 LAB — PROTEIN / CREATININE RATIO, URINE
Creatinine, Urine: 63 mg/dL (ref 20–320)
PROTEIN CREATININE RATIO: 175 mg/g{creat} — AB (ref 21–161)
Total Protein, Urine: 11 mg/dL (ref 5–24)

## 2017-06-25 ENCOUNTER — Other Ambulatory Visit (HOSPITAL_COMMUNITY): Payer: Self-pay | Admitting: *Deleted

## 2017-06-25 DIAGNOSIS — O24419 Gestational diabetes mellitus in pregnancy, unspecified control: Secondary | ICD-10-CM

## 2017-06-25 MED FILL — FREESTYLE LITE TEST STRIP: 25 days supply | Qty: 100 | Fill #4

## 2017-06-26 ENCOUNTER — Ambulatory Visit (INDEPENDENT_AMBULATORY_CARE_PROVIDER_SITE_OTHER): Payer: 59 | Admitting: Obstetrics & Gynecology

## 2017-06-26 VITALS — BP 145/75 | HR 112 | Wt 234.0 lb

## 2017-06-26 DIAGNOSIS — O133 Gestational [pregnancy-induced] hypertension without significant proteinuria, third trimester: Secondary | ICD-10-CM | POA: Diagnosis not present

## 2017-06-26 DIAGNOSIS — R8271 Bacteriuria: Secondary | ICD-10-CM

## 2017-06-26 DIAGNOSIS — O09512 Supervision of elderly primigravida, second trimester: Secondary | ICD-10-CM

## 2017-06-26 DIAGNOSIS — O24410 Gestational diabetes mellitus in pregnancy, diet controlled: Secondary | ICD-10-CM

## 2017-06-26 DIAGNOSIS — O30043 Twin pregnancy, dichorionic/diamniotic, third trimester: Secondary | ICD-10-CM | POA: Diagnosis not present

## 2017-06-26 DIAGNOSIS — O09513 Supervision of elderly primigravida, third trimester: Secondary | ICD-10-CM

## 2017-06-26 LAB — CBC
HEMATOCRIT: 35.3 % (ref 35.0–45.0)
HEMOGLOBIN: 11.3 g/dL — AB (ref 11.7–15.5)
MCH: 29.3 pg (ref 27.0–33.0)
MCHC: 32 g/dL (ref 32.0–36.0)
MCV: 91.5 fL (ref 80.0–100.0)
MPV: 8.9 fL (ref 7.5–12.5)
Platelets: 304 10*3/uL (ref 140–400)
RBC: 3.86 MIL/uL (ref 3.80–5.10)
RDW: 14.1 % (ref 11.0–15.0)
WBC: 10.2 10*3/uL (ref 3.8–10.8)

## 2017-06-26 NOTE — Addendum Note (Signed)
Addended by: Asencion Islam on: 06/26/2017 11:30 AM   Modules accepted: Orders

## 2017-06-26 NOTE — Progress Notes (Signed)
   PRENATAL VISIT NOTE  Subjective:  Cynthia [name] is a 36 y.o. M G1P0 at [redacted]w[redacted]d being seen today for ongoing prenatal care.  She is currently monitored for the following issues for this high-risk pregnancy and has Hyperlipidemia; Severe obesity (BMI >= 40) ([place]); Anxiety state; Menometrorrhagia; Anemia; Conjunctival cyst of both eyes; Pelvic adhesive disease; Endometrioma of ovary; History of endometriosis; Supervision of high risk primigravida in second trimester in patient 44 years or older at time of delivery; GBS bacteriuria; Gestational diabetes; Twin gestation, dichorionic diamniotic; Gestational hypertension w/o significant proteinuria in 2nd trimester; and Gestational hypertension on her problem list.  Patient reports no complaints.  Contractions: Not present. Vag. Bleeding: None.  Movement: Present. Denies leaking of fluid.   The following portions of the patient's history were reviewed and updated as appropriate: allergies, current medications, past family history, past medical history, past social history, past surgical history and problem list. Problem list updated.  Objective:   Vitals:   06/26/17 1046  BP: (!) 145/75  Pulse: (!) 112  Weight: 234 lb (106.1 kg)    Fetal Status: Fetal Heart Rate (bpm): 145/143   Movement: Present     General:  Alert, oriented and cooperative. Patient is in no acute distress.  Skin: Skin is warm and dry. No rash noted.   Cardiovascular: Normal heart rate noted  Respiratory: Normal respiratory effort, no problems with respiration noted  Abdomen: Soft, gravid, appropriate for gestational age.  Pain/Pressure: Present     Pelvic: Cervical exam deferred        Extremities: Normal range of motion.  Edema: Mild pitting, slight indentation  Mental Status:  Normal mood and affect. Normal behavior. Normal judgment and thought content.   Assessment and Plan:  Pregnancy: G1P0 at [redacted]w[redacted]d  1. Dichorionic diamniotic twin pregnancy in third trimester -  already scheduled for weekly BPPs at [facility]  2. Supervision of high risk primigravida in second trimester in patient 35 years or older at time of delivery - She has had 2 courses of BMZ  3. Severe obesity (BMI >= 40) ([facility]) - Excellent weight gain  4. Pregnancy-induced hypertension in third trimester - Stable BPs, weekly labs, on labetolol  5. GBS bacteriuria - treat in labor (prn)  6. Diet controlled gestational diabetes mellitus (GDM) in third trimester - all within normal range  Preterm labor symptoms and general obstetric precautions including but not limited to vaginal bleeding, contractions, leaking of fluid and fetal movement were reviewed in detail with the patient. Please refer to After Visit Summary for other counseling recommendations.  No Follow-up on file.   Emily Filbert, MD

## 2017-06-27 LAB — COMPREHENSIVE METABOLIC PANEL
ALT: 14 U/L (ref 6–29)
AST: 17 U/L (ref 10–30)
Albumin: 2.9 g/dL — ABNORMAL LOW (ref 3.6–5.1)
Alkaline Phosphatase: 180 U/L — ABNORMAL HIGH (ref 33–115)
BUN: 8 mg/dL (ref 7–25)
CHLORIDE: 106 mmol/L (ref 98–110)
CO2: 20 mmol/L (ref 20–31)
CREATININE: 0.61 mg/dL (ref 0.50–1.10)
Calcium: 9 mg/dL (ref 8.6–10.2)
GLUCOSE: 120 mg/dL — AB (ref 65–99)
POTASSIUM: 4.1 mmol/L (ref 3.5–5.3)
SODIUM: 137 mmol/L (ref 135–146)
Total Bilirubin: 0.4 mg/dL (ref 0.2–1.2)
Total Protein: 5.5 g/dL — ABNORMAL LOW (ref 6.1–8.1)

## 2017-06-27 LAB — PROTEIN / CREATININE RATIO, URINE
Creatinine, Urine: 85 mg/dL (ref 20–320)
Protein Creatinine Ratio: 129 mg/g creat (ref 21–161)
Total Protein, Urine: 11 mg/dL (ref 5–24)

## 2017-06-28 ENCOUNTER — Ambulatory Visit (HOSPITAL_COMMUNITY)
Admission: RE | Admit: 2017-06-28 | Discharge: 2017-06-28 | Disposition: A | Discharge status: Home / Self Care | Payer: 59 | Source: Ambulatory Visit | Attending: Obstetrics & Gynecology | Admitting: Obstetrics & Gynecology

## 2017-06-28 ENCOUNTER — Encounter (HOSPITAL_COMMUNITY): Payer: Self-pay

## 2017-06-28 DIAGNOSIS — O30043 Twin pregnancy, dichorionic/diamniotic, third trimester: Secondary | ICD-10-CM | POA: Diagnosis not present

## 2017-06-28 DIAGNOSIS — Z362 Encounter for other antenatal screening follow-up: Secondary | ICD-10-CM | POA: Diagnosis not present

## 2017-06-28 DIAGNOSIS — O99213 Obesity complicating pregnancy, third trimester: Secondary | ICD-10-CM | POA: Insufficient documentation

## 2017-06-28 DIAGNOSIS — O09513 Supervision of elderly primigravida, third trimester: Secondary | ICD-10-CM | POA: Insufficient documentation

## 2017-06-28 DIAGNOSIS — O24419 Gestational diabetes mellitus in pregnancy, unspecified control: Secondary | ICD-10-CM

## 2017-06-28 DIAGNOSIS — O09813 Supervision of pregnancy resulting from assisted reproductive technology, third trimester: Secondary | ICD-10-CM | POA: Diagnosis not present

## 2017-06-28 DIAGNOSIS — O24410 Gestational diabetes mellitus in pregnancy, diet controlled: Secondary | ICD-10-CM | POA: Insufficient documentation

## 2017-06-28 DIAGNOSIS — Z3A33 33 weeks gestation of pregnancy: Secondary | ICD-10-CM | POA: Insufficient documentation

## 2017-06-28 DIAGNOSIS — O30049 Twin pregnancy, dichorionic/diamniotic, unspecified trimester: Secondary | ICD-10-CM

## 2017-07-01 ENCOUNTER — Ambulatory Visit (HOSPITAL_COMMUNITY)
Admission: RE | Admit: 2017-07-01 | Discharge: 2017-07-01 | Disposition: A | Discharge status: Home / Self Care | Payer: 59 | Source: Ambulatory Visit | Attending: Obstetrics & Gynecology | Admitting: Obstetrics & Gynecology

## 2017-07-03 ENCOUNTER — Ambulatory Visit (INDEPENDENT_AMBULATORY_CARE_PROVIDER_SITE_OTHER): Payer: 59 | Admitting: Obstetrics & Gynecology

## 2017-07-03 VITALS — BP 136/94 | HR 84 | Wt 238.0 lb

## 2017-07-03 DIAGNOSIS — O133 Gestational [pregnancy-induced] hypertension without significant proteinuria, third trimester: Secondary | ICD-10-CM | POA: Diagnosis not present

## 2017-07-03 LAB — COMPREHENSIVE METABOLIC PANEL
ALT: 13 U/L (ref 6–29)
AST: 18 U/L (ref 10–30)
Albumin: 2.9 g/dL — ABNORMAL LOW (ref 3.6–5.1)
Alkaline Phosphatase: 195 U/L — ABNORMAL HIGH (ref 33–115)
BUN: 9 mg/dL (ref 7–25)
CHLORIDE: 106 mmol/L (ref 98–110)
CO2: 18 mmol/L — ABNORMAL LOW (ref 20–31)
CREATININE: 0.7 mg/dL (ref 0.50–1.10)
Calcium: 8.7 mg/dL (ref 8.6–10.2)
GLUCOSE: 87 mg/dL (ref 65–99)
POTASSIUM: 4.2 mmol/L (ref 3.5–5.3)
SODIUM: 138 mmol/L (ref 135–146)
TOTAL PROTEIN: 5.4 g/dL — AB (ref 6.1–8.1)
Total Bilirubin: 0.4 mg/dL (ref 0.2–1.2)

## 2017-07-03 LAB — CBC
HCT: 34.3 % — ABNORMAL LOW (ref 35.0–45.0)
Hemoglobin: 11.5 g/dL — ABNORMAL LOW (ref 11.7–15.5)
MCH: 29.9 pg (ref 27.0–33.0)
MCHC: 33.5 g/dL (ref 32.0–36.0)
MCV: 89.3 fL (ref 80.0–100.0)
MPV: 9 fL (ref 7.5–12.5)
PLATELETS: 273 10*3/uL (ref 140–400)
RBC: 3.84 MIL/uL (ref 3.80–5.10)
RDW: 13.8 % (ref 11.0–15.0)
WBC: 8.3 10*3/uL (ref 3.8–10.8)

## 2017-07-03 NOTE — Progress Notes (Signed)
   PRENATAL VISIT NOTE  Subjective:  Cynthia [name] is a 36 y.o. G1P0 at [redacted]w[redacted]d being seen today for ongoing prenatal care.  She is currently monitored for the following issues for this high-risk pregnancy and has Hyperlipidemia; Severe obesity (BMI >= 40) ([place]); Anxiety state; Menometrorrhagia; Anemia; Conjunctival cyst of both eyes; Pelvic adhesive disease; Endometrioma of ovary; History of endometriosis; Supervision of high risk primigravida in second trimester in patient 21 years or older at time of delivery; GBS bacteriuria; Gestational diabetes; Twin gestation, dichorionic diamniotic; Gestational hypertension w/o significant proteinuria in 2nd trimester; and Gestational hypertension on her problem list.  Patient reports no complaints.  Contractions: Not present. Vag. Bleeding: None.  Movement: Present. Denies leaking of fluid.   The following portions of the patient's history were reviewed and updated as appropriate: allergies, current medications, past family history, past medical history, past social history, past surgical history and problem list. Problem list updated.  Objective:   Vitals:   07/03/17 1030  Weight: 238 lb (108 kg)    Fetal Status:     Movement: Present     General:  Alert, oriented and cooperative. Patient is in no acute distress.  Skin: Skin is warm and dry. No rash noted.   Cardiovascular: Normal heart rate noted  Respiratory: Normal respiratory effort, no problems with respiration noted  Abdomen: Soft, gravid, appropriate for gestational age.  Pain/Pressure: Present     Pelvic: Cervical exam deferred        Extremities: Normal range of motion.  Edema: Mild pitting, slight indentation  Mental Status:  Normal mood and affect. Normal behavior. Normal judgment and thought content.   Assessment and Plan:  Pregnancy: G1P0 at [redacted]w[redacted]d - continue weekly MFM monitoring and weekly labs and weekly cervical exams  There are no diagnoses linked to this encounter. Preterm  labor symptoms and general obstetric precautions including but not limited to vaginal bleeding, contractions, leaking of fluid and fetal movement were reviewed in detail with the patient. Please refer to After Visit Summary for other counseling recommendations.  No Follow-up on file.   Emily Filbert, MD

## 2017-07-04 LAB — PROTEIN / CREATININE RATIO, URINE
CREATININE, URINE: 199 mg/dL (ref 20–320)
Protein Creatinine Ratio: 111 mg/g creat (ref 21–161)
Total Protein, Urine: 22 mg/dL (ref 5–24)

## 2017-07-05 ENCOUNTER — Ambulatory Visit (HOSPITAL_COMMUNITY)
Admission: RE | Admit: 2017-07-05 | Discharge: 2017-07-05 | Disposition: A | Discharge status: Home / Self Care | Payer: 59 | Source: Ambulatory Visit | Attending: Obstetrics & Gynecology | Admitting: Obstetrics & Gynecology

## 2017-07-05 ENCOUNTER — Telehealth: Payer: Self-pay | Admitting: *Deleted

## 2017-07-05 ENCOUNTER — Encounter (HOSPITAL_COMMUNITY): Payer: Self-pay

## 2017-07-05 ENCOUNTER — Other Ambulatory Visit (HOSPITAL_COMMUNITY): Payer: Self-pay | Admitting: Maternal and Fetal Medicine

## 2017-07-05 ENCOUNTER — Other Ambulatory Visit (HOSPITAL_COMMUNITY): Payer: Self-pay | Admitting: Maternal & Fetal Medicine

## 2017-07-05 DIAGNOSIS — Z3A34 34 weeks gestation of pregnancy: Secondary | ICD-10-CM | POA: Diagnosis not present

## 2017-07-05 DIAGNOSIS — O09513 Supervision of elderly primigravida, third trimester: Secondary | ICD-10-CM

## 2017-07-05 DIAGNOSIS — O09813 Supervision of pregnancy resulting from assisted reproductive technology, third trimester: Secondary | ICD-10-CM | POA: Diagnosis not present

## 2017-07-05 DIAGNOSIS — O24419 Gestational diabetes mellitus in pregnancy, unspecified control: Secondary | ICD-10-CM

## 2017-07-05 DIAGNOSIS — Z6838 Body mass index (BMI) 38.0-38.9, adult: Secondary | ICD-10-CM | POA: Insufficient documentation

## 2017-07-05 DIAGNOSIS — O24410 Gestational diabetes mellitus in pregnancy, diet controlled: Secondary | ICD-10-CM | POA: Insufficient documentation

## 2017-07-05 DIAGNOSIS — O30049 Twin pregnancy, dichorionic/diamniotic, unspecified trimester: Secondary | ICD-10-CM

## 2017-07-05 DIAGNOSIS — O30043 Twin pregnancy, dichorionic/diamniotic, third trimester: Secondary | ICD-10-CM | POA: Insufficient documentation

## 2017-07-05 DIAGNOSIS — O99213 Obesity complicating pregnancy, third trimester: Secondary | ICD-10-CM | POA: Diagnosis not present

## 2017-07-05 DIAGNOSIS — O403XX0 Polyhydramnios, third trimester, not applicable or unspecified: Secondary | ICD-10-CM | POA: Diagnosis not present

## 2017-07-05 DIAGNOSIS — Z362 Encounter for other antenatal screening follow-up: Secondary | ICD-10-CM | POA: Diagnosis not present

## 2017-07-05 NOTE — Telephone Encounter (Signed)
Pt called this morning stating that she has a good amount of mid sternum pain that radiated to her mid back.  This was intense as 6-7 on pain scale.  She denies any nausea or vomiting or diarrhea.  She does still have her gallbladder.  [facility]  Suggested that she may stop in the MAU for evaluation and poss gallbladder U/S

## 2017-07-10 ENCOUNTER — Encounter: Payer: Self-pay | Admitting: *Deleted

## 2017-07-10 ENCOUNTER — Ambulatory Visit (INDEPENDENT_AMBULATORY_CARE_PROVIDER_SITE_OTHER): Payer: 59 | Admitting: Obstetrics & Gynecology

## 2017-07-10 ENCOUNTER — Encounter (HOSPITAL_COMMUNITY): Payer: Self-pay | Admitting: *Deleted

## 2017-07-10 ENCOUNTER — Telehealth (HOSPITAL_COMMUNITY): Payer: Self-pay | Admitting: *Deleted

## 2017-07-10 VITALS — BP 138/95 | HR 103 | Wt 242.0 lb

## 2017-07-10 DIAGNOSIS — O132 Gestational [pregnancy-induced] hypertension without significant proteinuria, second trimester: Secondary | ICD-10-CM

## 2017-07-10 DIAGNOSIS — R030 Elevated blood-pressure reading, without diagnosis of hypertension: Secondary | ICD-10-CM | POA: Diagnosis not present

## 2017-07-10 DIAGNOSIS — O133 Gestational [pregnancy-induced] hypertension without significant proteinuria, third trimester: Secondary | ICD-10-CM

## 2017-07-10 DIAGNOSIS — O30043 Twin pregnancy, dichorionic/diamniotic, third trimester: Secondary | ICD-10-CM

## 2017-07-10 LAB — CBC
HEMATOCRIT: 35.8 % (ref 35.0–45.0)
Hemoglobin: 11.8 g/dL (ref 11.7–15.5)
MCH: 29.8 pg (ref 27.0–33.0)
MCHC: 33 g/dL (ref 32.0–36.0)
MCV: 90.4 fL (ref 80.0–100.0)
MPV: 9 fL (ref 7.5–12.5)
Platelets: 305 10*3/uL (ref 140–400)
RBC: 3.96 MIL/uL (ref 3.80–5.10)
RDW: 14.2 % (ref 11.0–15.0)
WBC: 8.4 10*3/uL (ref 3.8–10.8)

## 2017-07-10 MED FILL — LABETALOL HCL 200 MG TABLET: 200 | 30 days supply | Qty: 60 | Fill #1

## 2017-07-10 NOTE — Progress Notes (Signed)
   PRENATAL VISIT NOTE  Subjective:  Cynthia [name] is a 36 y.o. G1P0 at [redacted]w[redacted]d being seen today for ongoing prenatal care.  She is currently monitored for the following issues for this high-risk pregnancy and has Hyperlipidemia; Severe obesity (BMI >= 40) ([place]); Anxiety state; Menometrorrhagia; Anemia; Conjunctival cyst of both eyes; Pelvic adhesive disease; Endometrioma of ovary; History of endometriosis; Supervision of high risk primigravida in second trimester in patient 71 years or older at time of delivery; GBS bacteriuria; Gestational diabetes; Twin gestation, dichorionic diamniotic; Gestational hypertension w/o significant proteinuria in 2nd trimester; and Gestational hypertension on her problem list.  Patient reports no complaints.  Contractions: Irritability. Vag. Bleeding: None.  Movement: Present. Denies leaking of fluid.   The following portions of the patient's history were reviewed and updated as appropriate: allergies, current medications, past family history, past medical history, past social history, past surgical history and problem list. Problem list updated.  Objective:   Vitals:   07/10/17 1048  BP: (!) 138/95  Pulse: (!) 103  Weight: 242 lb (109.8 kg)    Fetal Status:     Movement: Present     General:  Alert, oriented and cooperative. Patient is in no acute distress.  Skin: Skin is warm and dry. No rash noted.   Cardiovascular: Normal heart rate noted  Respiratory: Normal respiratory effort, no problems with respiration noted  Abdomen: Soft, gravid, appropriate for gestational age.  Pain/Pressure: Present     Pelvic: Cervical exam performed      4/60/-3/Vtx  Extremities: Normal range of motion.  Edema: Mild pitting, slight indentation  Mental Status:  Normal mood and affect. Normal behavior. Normal judgment and thought content.   Assessment and Plan:  Pregnancy: G1P0 at [redacted]w[redacted]d  1. Gestational hypertension w/o significant proteinuria in 2nd trimester -Continue  labetalol and home BP monitoring.  Pt reports nml BPs at home and at [facility] - Comprehensive metabolic panel - CBC - Protein / creatinine ratio, urine - Weekly BPPs - Induction at 37 weeks--pitocin (4cm)  2. Dichorionic diamniotic twin pregnancy in third trimester Vtx/Vtx; growth prn with MFM    Preterm labor symptoms and general obstetric precautions including but not limited to vaginal bleeding, contractions, leaking of fluid and fetal movement were reviewed in detail with the patient. Please refer to After Visit Summary for other counseling recommendations.  Return in about 1 week (around 07/17/2017).   [name], MD

## 2017-07-10 NOTE — Telephone Encounter (Signed)
Preadmission screen  

## 2017-07-11 ENCOUNTER — Inpatient Hospital Stay (HOSPITAL_COMMUNITY): Payer: 59

## 2017-07-11 ENCOUNTER — Inpatient Hospital Stay (HOSPITAL_COMMUNITY)
Admission: AD | Admit: 2017-07-11 | Discharge: 2017-07-17 | DRG: 765 | Disposition: A | Discharge status: Home / Self Care | Payer: 59 | Source: Ambulatory Visit | Attending: Obstetrics and Gynecology | Admitting: Obstetrics and Gynecology

## 2017-07-11 ENCOUNTER — Encounter (HOSPITAL_COMMUNITY): Payer: Self-pay

## 2017-07-11 DIAGNOSIS — O288 Other abnormal findings on antenatal screening of mother: Secondary | ICD-10-CM

## 2017-07-11 DIAGNOSIS — O99824 Streptococcus B carrier state complicating childbirth: Secondary | ICD-10-CM | POA: Diagnosis not present

## 2017-07-11 DIAGNOSIS — R8271 Bacteriuria: Secondary | ICD-10-CM | POA: Diagnosis present

## 2017-07-11 DIAGNOSIS — O30049 Twin pregnancy, dichorionic/diamniotic, unspecified trimester: Secondary | ICD-10-CM | POA: Diagnosis present

## 2017-07-11 DIAGNOSIS — O1494 Unspecified pre-eclampsia, complicating childbirth: Principal | ICD-10-CM | POA: Diagnosis present

## 2017-07-11 DIAGNOSIS — Z6841 Body Mass Index (BMI) 40.0 and over, adult: Secondary | ICD-10-CM | POA: Diagnosis not present

## 2017-07-11 DIAGNOSIS — O99214 Obesity complicating childbirth: Secondary | ICD-10-CM | POA: Diagnosis present

## 2017-07-11 DIAGNOSIS — O134 Gestational [pregnancy-induced] hypertension without significant proteinuria, complicating childbirth: Secondary | ICD-10-CM | POA: Diagnosis not present

## 2017-07-11 DIAGNOSIS — O24419 Gestational diabetes mellitus in pregnancy, unspecified control: Secondary | ICD-10-CM | POA: Diagnosis present

## 2017-07-11 DIAGNOSIS — O24420 Gestational diabetes mellitus in childbirth, diet controlled: Secondary | ICD-10-CM | POA: Diagnosis not present

## 2017-07-11 DIAGNOSIS — Z7982 Long term (current) use of aspirin: Secondary | ICD-10-CM

## 2017-07-11 DIAGNOSIS — Z3A35 35 weeks gestation of pregnancy: Secondary | ICD-10-CM | POA: Diagnosis not present

## 2017-07-11 DIAGNOSIS — O1413 Severe pre-eclampsia, third trimester: Secondary | ICD-10-CM | POA: Diagnosis present

## 2017-07-11 DIAGNOSIS — O321XX2 Maternal care for breech presentation, fetus 2: Secondary | ICD-10-CM | POA: Diagnosis not present

## 2017-07-11 DIAGNOSIS — O368132 Decreased fetal movements, third trimester, fetus 2: Secondary | ICD-10-CM | POA: Diagnosis not present

## 2017-07-11 DIAGNOSIS — O09512 Supervision of elderly primigravida, second trimester: Secondary | ICD-10-CM

## 2017-07-11 DIAGNOSIS — O324XX0 Maternal care for high head at term, not applicable or unspecified: Secondary | ICD-10-CM | POA: Diagnosis not present

## 2017-07-11 DIAGNOSIS — O133 Gestational [pregnancy-induced] hypertension without significant proteinuria, third trimester: Secondary | ICD-10-CM | POA: Diagnosis not present

## 2017-07-11 DIAGNOSIS — O30043 Twin pregnancy, dichorionic/diamniotic, third trimester: Secondary | ICD-10-CM | POA: Diagnosis present

## 2017-07-11 HISTORY — DX: Endometriosis of ovary: N80.1

## 2017-07-11 HISTORY — DX: Gestational (pregnancy-induced) hypertension without significant proteinuria, second trimester: O13.2

## 2017-07-11 LAB — COMPREHENSIVE METABOLIC PANEL
ALBUMIN: 2.6 g/dL — AB (ref 3.5–5.0)
ALT: 12 U/L (ref 6–29)
ALT: 16 U/L (ref 14–54)
AST: 19 U/L (ref 10–30)
AST: 23 U/L (ref 15–41)
Albumin: 3 g/dL — ABNORMAL LOW (ref 3.6–5.1)
Alkaline Phosphatase: 223 U/L — ABNORMAL HIGH (ref 33–115)
Alkaline Phosphatase: 194 U/L — ABNORMAL HIGH (ref 38–126)
Anion gap: 9 (ref 5–15)
BUN: 8 mg/dL (ref 7–25)
BUN: 12 mg/dL (ref 6–20)
CHLORIDE: 106 mmol/L (ref 98–110)
CHLORIDE: 104 mmol/L (ref 101–111)
CO2: 17 mmol/L — ABNORMAL LOW (ref 20–31)
CO2: 22 mmol/L (ref 22–32)
CREATININE: 0.71 mg/dL (ref 0.50–1.10)
CREATININE: 0.71 mg/dL (ref 0.44–1.00)
Calcium: 8.7 mg/dL (ref 8.6–10.2)
Calcium: 9.5 mg/dL (ref 8.9–10.3)
GFR calc Af Amer: >60 mL/min (ref >60)
GFR calc non Af Amer: >60 mL/min (ref >60)
GLUCOSE: 71 mg/dL (ref 65–99)
GLUCOSE: 74 mg/dL (ref 65–99)
Potassium: 4.2 mmol/L (ref 3.5–5.3)
Potassium: 4 mmol/L (ref 3.5–5.1)
SODIUM: 138 mmol/L (ref 135–146)
SODIUM: 135 mmol/L (ref 135–145)
TOTAL PROTEIN: 5.7 g/dL — AB (ref 6.1–8.1)
Total Bilirubin: 0.4 mg/dL (ref 0.2–1.2)
Total Bilirubin: 0.6 mg/dL (ref 0.3–1.2)
Total Protein: 6 g/dL — ABNORMAL LOW (ref 6.5–8.1)

## 2017-07-11 LAB — CBC
HCT: 34.9 % — ABNORMAL LOW (ref 36.0–46.0)
Hemoglobin: 11.8 g/dL — ABNORMAL LOW (ref 12.0–15.0)
MCH: 30.1 pg (ref 26.0–34.0)
MCHC: 33.8 g/dL (ref 30.0–36.0)
MCV: 89 fL (ref 78.0–100.0)
PLATELETS: 287 10*3/uL (ref 150–400)
RBC: 3.92 MIL/uL (ref 3.87–5.11)
RDW: 13.9 % (ref 11.5–15.5)
WBC: 8.4 10*3/uL (ref 4.0–10.5)

## 2017-07-11 LAB — TYPE AND SCREEN
ABO/RH(D): A POS
ANTIBODY SCREEN: NEGATIVE

## 2017-07-11 LAB — PROTEIN / CREATININE RATIO, URINE
Creatinine, Urine: 119 mg/dL (ref 20–320)
Creatinine, Urine: 161 mg/dL
Protein Creatinine Ratio: 118 mg/g creat (ref 21–161)
Protein Creatinine Ratio: 0.14 mg/mg{Cre} (ref 0.00–0.15)
TOTAL PROTEIN, URINE: 14 mg/dL (ref 5–24)
TOTAL PROTEIN, URINE: 23 mg/dL

## 2017-07-11 LAB — GLUCOSE, CAPILLARY
Glucose-Capillary: 97 mg/dL (ref 65–99)
Glucose-Capillary: 86 mg/dL (ref 65–99)

## 2017-07-11 MED ORDER — PRENATAL PLUS 27-1 MG PO TABS
1.0000 | ORAL_TABLET | Freq: Every day | ORAL | Status: DC
  Administered 2017-07-11: 1 via ORAL
  Filled 2017-07-11 (×3): qty 1

## 2017-07-11 MED ORDER — DOCUSATE SODIUM 100 MG PO CAPS
100.0000 mg | ORAL_CAPSULE | Freq: Two times a day (BID) | ORAL | Status: DC | PRN
  Administered 2017-07-12: 100 mg via ORAL
  Filled 2017-07-11 (×2): qty 1

## 2017-07-11 MED ORDER — ACETAMINOPHEN 325 MG PO TABS: 650.0000 mg | ORAL_TABLET | ORAL | Status: DC | PRN

## 2017-07-11 MED ORDER — SODIUM CHLORIDE 0.9 % IV SOLN: 250.0000 mL | INTRAVENOUS | Status: DC | PRN

## 2017-07-11 MED ORDER — SODIUM CHLORIDE 0.9% FLUSH: 3.0000 mL | INTRAVENOUS | Status: DC | PRN

## 2017-07-11 MED ORDER — ZOLPIDEM TARTRATE 5 MG PO TABS: 5.0000 mg | ORAL_TABLET | Freq: Every evening | ORAL | Status: DC | PRN

## 2017-07-11 MED ORDER — LABETALOL HCL 100 MG PO TABS
100.0000 mg | ORAL_TABLET | Freq: Once | ORAL | Status: AC
  Administered 2017-07-11: 100 mg via ORAL
  Filled 2017-07-11: qty 1

## 2017-07-11 MED ORDER — LABETALOL HCL 100 MG PO TABS
100.0000 mg | ORAL_TABLET | Freq: Two times a day (BID) | ORAL | Status: DC
Start: 2017-07-11 — End: 2017-07-11

## 2017-07-11 MED ORDER — LABETALOL HCL 200 MG PO TABS
200.0000 mg | ORAL_TABLET | Freq: Two times a day (BID) | ORAL | Status: DC
  Administered 2017-07-11 – 2017-07-12 (×2): 200 mg via ORAL
  Filled 2017-07-11 (×2): qty 1

## 2017-07-11 MED ORDER — ASPIRIN EC 81 MG PO TBEC
81.0000 mg | DELAYED_RELEASE_TABLET | Freq: Every day | ORAL | Status: DC
  Administered 2017-07-11: 81 mg via ORAL
  Filled 2017-07-11 (×2): qty 1

## 2017-07-11 MED ORDER — SODIUM CHLORIDE 0.9% FLUSH
3.0000 mL | Freq: Two times a day (BID) | INTRAVENOUS | Status: DC
  Administered 2017-07-11 – 2017-07-12 (×2): 3 mL via INTRAVENOUS

## 2017-07-11 MED ORDER — DOXYLAMINE SUCCINATE (SLEEP) 25 MG PO TABS
50.0000 mg | ORAL_TABLET | Freq: Every evening | ORAL | Status: DC | PRN
  Administered 2017-07-11: 50 mg via ORAL
  Filled 2017-07-11 (×2): qty 2

## 2017-07-11 NOTE — MAU Provider Note (Signed)
CC:  Chief Complaint  Patient presents with  . Contractions     First Provider Initiated Contact with Patient 07/11/17 [phone]      HPI: Cynthia [name] is a 36 y.o. year old G75P0010 female at [redacted]w[redacted]d weeks gestation who presents to MAU reporting increased contractions during the night every 6-20. Is being followed for:  1. PTL. Last cervical exam 4/70%.  2. GHTN. Nml labs yesterday. No Pre-E Sx. Taking labetalol 100 mg BID last dose was this Am.  3. Di/di twins, mild poly x 2. 21% growth discordance 07/05/17. 4. AMA 5. A1GDM  Associated Sx:  Vaginal bleeding: Denies Leaking of fluid: Denies Fetal movement: Decreased x 1 week  BPP scheduled tomorrow.   O:  Patient Vitals for the past 24 hrs:  BP Temp Temp src Pulse Resp SpO2 Height Weight  07/11/17 1456 (!) 149/99 - - 86 - - - -  07/11/17 1454 138/88 - - 87 - - - -  07/11/17 1429 (!) 150/105 97.9 F (36.6 C) Oral 95 - 100 % 5\' 2"  (1.575 m) 241 lb 9.6 oz (109.6 kg)  07/11/17 1331 (!) 143/90 - - 86 - - - -  07/11/17 1316 (!) 146/106 - - 85 - - - -  07/11/17 1301 (!) 148/97 - - 87 - - - -  07/11/17 1246 (!) 156/99 - - 88 - - - -  07/11/17 1232 (!) 140/103 - - 83 - - - -  07/11/17 1221 (!) 155/107 - - 94 - - - -  07/11/17 1216 (!) 148/109 - - 90 - - - -  07/11/17 1214 (!) 153/99 - - 93 - - - -  07/11/17 1103 - - - - - 100 % - -  07/11/17 1101 (!) 152/99 - - 88 - - - -  07/11/17 1046 137/84 - - 90 - 98 % - -  07/11/17 1030 135/89 - - 89 - 99 % - -  07/11/17 1016 138/87 - - 88 - - - -  07/11/17 1001 138/83 - - 85 - - - -  07/11/17 0946 131/86 - - 88 - - - -  07/11/17 0931 (!) 147/89 - - (!) 101 - 99 % - -  07/11/17 0922 - - - - - 99 % - -  07/11/17 0917 - - - - - 99 % - -  07/11/17 0916 (!) 137/95 - - 96 - - - -  07/11/17 0912 - - - - - 99 % - -  07/11/17 0907 - - - - - 99 % - -  07/11/17 0902 - - - - - 99 % - -  07/11/17 0901 (!) 144/97 - - 98 - - - -  07/11/17 0857 - - - - - 99 % - -  07/11/17 0847 (!) 139/93 - - 95 - 98  % - -  07/11/17 0833 (!) 137/91 97.7 F (36.5 C) Oral (!) 102 17 100 % - -    General: NAD Heart: Regular rate Lungs: Normal rate and effort Abd: Soft, NT, Gravid, S=D Pelvic: NEFG, Neg pooling, No blood.  Dilation: 4 Effacement (%): 70 Cervical Position: Posterior Presentation: Vertex Exam by:: Marlou Porch CNM  EFM:  A:145, Moderate variability, 10x10 accelerations, no decelerations. Intermittent tracing. Adjusted numerous times.  B:145, Moderate variability, 15 x 15 accelerations, no decelerations Toco: Contractions Irreg, mild  BPP ordered. If Nml, will reschedule BPP scheduled for tomorrow to next week.  Care of pt turned over to [facility]  Rasch at 1005 AM.   BPP 8/8 BP recordings approaching severe range. 150/105 highest reading.  PreE labs reordered   Results for orders placed or performed during the hospital encounter of 07/11/17 (from the past 24 hour(s))  Protein / creatinine ratio, urine     Status: None   Collection Time: 07/11/17  8:19 AM  Result Value Ref Range   Creatinine, Urine 161.00 mg/dL   Total Protein, Urine 23 mg/dL   Protein Creatinine Ratio 0.14 0.00 - 0.15 mg/mg[Cre]  CBC     Status: Abnormal   Collection Time: 07/11/17 12:59 PM  Result Value Ref Range   WBC 8.4 4.0 - 10.5 K/uL   RBC 3.92 3.87 - 5.11 MIL/uL   Hemoglobin 11.8 (L) 12.0 - 15.0 g/dL   HCT 34.9 (L) 36.0 - 46.0 %   MCV 89.0 78.0 - 100.0 fL   MCH 30.1 26.0 - 34.0 pg   MCHC 33.8 30.0 - 36.0 g/dL   RDW 13.9 11.5 - 15.5 %   Platelets 287 150 - 400 K/uL  Comprehensive metabolic panel     Status: Abnormal   Collection Time: 07/11/17 12:59 PM  Result Value Ref Range   Sodium 135 135 - 145 mmol/L   Potassium 4.0 3.5 - 5.1 mmol/L   Chloride 104 101 - 111 mmol/L   CO2 22 22 - 32 mmol/L   Glucose, Bld 74 65 - 99 mg/dL   BUN 12 6 - 20 mg/dL   Creatinine, Ser 0.71 0.44 - 1.00 mg/dL   Calcium 9.5 8.9 - 10.3 mg/dL   Total Protein 6.0 (L) 6.5 - 8.1 g/dL   Albumin 2.6 (L) 3.5 - 5.0 g/dL   AST  23 15 - 41 U/L   ALT 16 14 - 54 U/L   Alkaline Phosphatase 194 (H) 38 - 126 U/L   Total Bilirubin 0.6 0.3 - 1.2 mg/dL   GFR calc non Af Amer >60 >60 mL/min   GFR calc Af Amer >60 >60 mL/min   Anion gap 9 5 - 15  Type and screen [place]     Status: None   Collection Time: 07/11/17 12:59 PM  Result Value Ref Range   ABO/RH(D) A POS    Antibody Screen NEG    Sample Expiration 07/14/2017    Discussed labs, BPP and BP recordings with Dr. Ilda Basset. Patient is asymptomatic at this time. Will admit for observation per Dr. Ilda Basset.   [place], [place] 07/11/2017 10:08AM   A:  1. Pregnancy-induced hypertension in third trimester   2. Non-reactive NST (non-stress test)   3. Dichorionic diamniotic twin pregnancy in third trimester   4. [redacted] weeks gestation of pregnancy   5. Decreased fetal movement determined by examination in third trimester, fetus 2 of multiple gestation   6. Decreased fetal movements, third trimester, fetus 2     P:  Admit to high risk OB per Dr. Sherryl Manges, Artist Pais, NP 07/11/2017 4:09 PM

## 2017-07-11 NOTE — MAU Note (Signed)
+  contractions Started at 5am None in the past hour; Denies pain at this time  States was 4cm/70% at last ve yesterday  Denies LOF or VB.  +FM

## 2017-07-11 NOTE — ED Notes (Signed)
Urine in lab 

## 2017-07-12 ENCOUNTER — Observation Stay (HOSPITAL_COMMUNITY): Payer: 59

## 2017-07-12 ENCOUNTER — Encounter (HOSPITAL_COMMUNITY): Payer: Self-pay | Admitting: Obstetrics & Gynecology

## 2017-07-12 ENCOUNTER — Ambulatory Visit (HOSPITAL_COMMUNITY): Admission: RE | Admit: 2017-07-12 | Payer: 59 | Source: Ambulatory Visit

## 2017-07-12 DIAGNOSIS — O324XX0 Maternal care for high head at term, not applicable or unspecified: Secondary | ICD-10-CM | POA: Diagnosis present

## 2017-07-12 DIAGNOSIS — O30043 Twin pregnancy, dichorionic/diamniotic, third trimester: Secondary | ICD-10-CM | POA: Diagnosis not present

## 2017-07-12 DIAGNOSIS — O133 Gestational [pregnancy-induced] hypertension without significant proteinuria, third trimester: Secondary | ICD-10-CM

## 2017-07-12 DIAGNOSIS — O24419 Gestational diabetes mellitus in pregnancy, unspecified control: Secondary | ICD-10-CM | POA: Diagnosis not present

## 2017-07-12 DIAGNOSIS — O99214 Obesity complicating childbirth: Secondary | ICD-10-CM | POA: Diagnosis present

## 2017-07-12 DIAGNOSIS — O321XX2 Maternal care for breech presentation, fetus 2: Secondary | ICD-10-CM | POA: Diagnosis present

## 2017-07-12 DIAGNOSIS — O1494 Unspecified pre-eclampsia, complicating childbirth: Secondary | ICD-10-CM | POA: Diagnosis present

## 2017-07-12 DIAGNOSIS — Z3A35 35 weeks gestation of pregnancy: Secondary | ICD-10-CM | POA: Diagnosis not present

## 2017-07-12 DIAGNOSIS — O368132 Decreased fetal movements, third trimester, fetus 2: Secondary | ICD-10-CM | POA: Diagnosis not present

## 2017-07-12 DIAGNOSIS — O99824 Streptococcus B carrier state complicating childbirth: Secondary | ICD-10-CM | POA: Diagnosis present

## 2017-07-12 DIAGNOSIS — O24429 Gestational diabetes mellitus in childbirth, unspecified control: Secondary | ICD-10-CM | POA: Diagnosis not present

## 2017-07-12 DIAGNOSIS — Z6841 Body Mass Index (BMI) 40.0 and over, adult: Secondary | ICD-10-CM | POA: Diagnosis not present

## 2017-07-12 DIAGNOSIS — O24420 Gestational diabetes mellitus in childbirth, diet controlled: Secondary | ICD-10-CM | POA: Diagnosis present

## 2017-07-12 DIAGNOSIS — Z7982 Long term (current) use of aspirin: Secondary | ICD-10-CM | POA: Diagnosis not present

## 2017-07-12 DIAGNOSIS — O288 Other abnormal findings on antenatal screening of mother: Secondary | ICD-10-CM | POA: Diagnosis not present

## 2017-07-12 DIAGNOSIS — O134 Gestational [pregnancy-induced] hypertension without significant proteinuria, complicating childbirth: Secondary | ICD-10-CM | POA: Diagnosis present

## 2017-07-12 LAB — GLUCOSE, CAPILLARY
GLUCOSE-CAPILLARY: 116 mg/dL — AB (ref 65–99)
GLUCOSE-CAPILLARY: 94 mg/dL (ref 65–99)
GLUCOSE-CAPILLARY: 96 mg/dL (ref 65–99)
Glucose-Capillary: 76 mg/dL (ref 65–99)
Glucose-Capillary: 68 mg/dL (ref 65–99)
Glucose-Capillary: 85 mg/dL (ref 65–99)

## 2017-07-12 LAB — CBC
HCT: 33 % — ABNORMAL LOW (ref 36.0–46.0)
Hemoglobin: 11.2 g/dL — ABNORMAL LOW (ref 12.0–15.0)
MCH: 30.2 pg (ref 26.0–34.0)
MCHC: 33.9 g/dL (ref 30.0–36.0)
MCV: 88.9 fL (ref 78.0–100.0)
PLATELETS: 292 10*3/uL (ref 150–400)
RBC: 3.71 MIL/uL — ABNORMAL LOW (ref 3.87–5.11)
RDW: 13.8 % (ref 11.5–15.5)
WBC: 8.8 10*3/uL (ref 4.0–10.5)

## 2017-07-12 LAB — CREATININE CLEARANCE, URINE, 24 HOUR
CREAT CLEAR: 159 mL/min — AB (ref 75–115)
CREATININE 24H UR: 1629 mg/d (ref 600–1800)
Collection Interval-CRCL: 24 hours
Creatinine, Urine: 95.8 mg/dL
Urine Total Volume-CRCL: 1700 mL

## 2017-07-12 LAB — COMPREHENSIVE METABOLIC PANEL
ALT: 15 U/L (ref 14–54)
AST: 25 U/L (ref 15–41)
Albumin: 2.4 g/dL — ABNORMAL LOW (ref 3.5–5.0)
Alkaline Phosphatase: 185 U/L — ABNORMAL HIGH (ref 38–126)
Anion gap: 10 (ref 5–15)
BUN: 16 mg/dL (ref 6–20)
CHLORIDE: 105 mmol/L (ref 101–111)
CO2: 20 mmol/L — ABNORMAL LOW (ref 22–32)
CREATININE: 0.69 mg/dL (ref 0.44–1.00)
Calcium: 9.2 mg/dL (ref 8.9–10.3)
Glucose, Bld: 109 mg/dL — ABNORMAL HIGH (ref 65–99)
Potassium: 3.8 mmol/L (ref 3.5–5.1)
Sodium: 135 mmol/L (ref 135–145)
TOTAL PROTEIN: 5.8 g/dL — AB (ref 6.5–8.1)
Total Bilirubin: 0.4 mg/dL (ref 0.3–1.2)

## 2017-07-12 LAB — PROTEIN, URINE, 24 HOUR
COLLECTION INTERVAL-UPROT: 24 h
PROTEIN, URINE: 12 mg/dL
Protein, 24H Urine: 204 mg/d — ABNORMAL HIGH (ref 50–100)
URINE TOTAL VOLUME-UPROT: 1700 mL

## 2017-07-12 MED ORDER — OXYTOCIN BOLUS FROM INFUSION: 500.0000 mL | Freq: Once | INTRAVENOUS | Status: DC

## 2017-07-12 MED ORDER — ONDANSETRON HCL 4 MG/2ML IJ SOLN
4.0000 mg | Freq: Four times a day (QID) | INTRAMUSCULAR | Status: DC | PRN
  Administered 2017-07-13 (×3): 4 mg via INTRAVENOUS
  Filled 2017-07-12 (×3): qty 2

## 2017-07-12 MED ORDER — LACTATED RINGERS IV SOLN
500.0000 mL | Freq: Once | INTRAVENOUS | Status: AC
  Administered 2017-07-13: 500 mL via INTRAVENOUS

## 2017-07-12 MED ORDER — LACTATED RINGERS IV SOLN
INTRAVENOUS | Status: DC
  Administered 2017-07-12: 100 mL/h via INTRAVENOUS
  Administered 2017-07-13 – 2017-07-14 (×3): via INTRAVENOUS

## 2017-07-12 MED ORDER — LIDOCAINE HCL (PF) 1 % IJ SOLN: 30.0000 mL | INTRAMUSCULAR | Status: DC | PRN

## 2017-07-12 MED ORDER — ZOLPIDEM TARTRATE 5 MG PO TABS: 5.0000 mg | ORAL_TABLET | Freq: Every evening | ORAL | Status: DC | PRN

## 2017-07-12 MED ORDER — DIPHENHYDRAMINE HCL 50 MG/ML IJ SOLN
12.5000 mg | INTRAMUSCULAR | Status: DC | PRN
  Administered 2017-07-13 (×2): 12.5 mg via INTRAVENOUS
  Filled 2017-07-12: qty 1

## 2017-07-12 MED ORDER — PHENYLEPHRINE 40 MCG/ML (10ML) SYRINGE FOR IV PUSH (FOR BLOOD PRESSURE SUPPORT)
80.0000 ug | PREFILLED_SYRINGE | INTRAVENOUS | Status: DC | PRN
  Administered 2017-07-13 (×2): 80 ug via INTRAVENOUS
  Filled 2017-07-12: qty 10

## 2017-07-12 MED ORDER — MAGNESIUM SULFATE 40 G IN LACTATED RINGERS - SIMPLE
2.0000 g/h | INTRAVENOUS | Status: DC
  Administered 2017-07-13: 2 g/h via INTRAVENOUS
  Filled 2017-07-12: qty 40
  Filled 2017-07-12: qty 500

## 2017-07-12 MED ORDER — PENICILLIN G POTASSIUM 5000000 UNITS IJ SOLR
5.0000 10*6.[IU] | Freq: Once | INTRAVENOUS | Status: AC
  Administered 2017-07-12: 5 10*6.[IU] via INTRAVENOUS
  Filled 2017-07-12: qty 5

## 2017-07-12 MED ORDER — HYDRALAZINE HCL 20 MG/ML IJ SOLN: 10.0000 mg | Freq: Once | INTRAMUSCULAR | Status: DC | PRN

## 2017-07-12 MED ORDER — OXYCODONE-ACETAMINOPHEN 5-325 MG PO TABS
1.0000 | ORAL_TABLET | ORAL | Status: DC | PRN
Start: 2017-07-12 — End: 2017-07-14
  Administered 2017-07-13: 1 via ORAL
  Filled 2017-07-12: qty 1

## 2017-07-12 MED ORDER — SOD CITRATE-CITRIC ACID 500-334 MG/5ML PO SOLN
30.0000 mL | ORAL | Status: DC | PRN
  Administered 2017-07-14: 30 mL via ORAL
  Filled 2017-07-12: qty 15

## 2017-07-12 MED ORDER — TERBUTALINE SULFATE 1 MG/ML IJ SOLN: 0.2500 mg | Freq: Once | INTRAMUSCULAR | Status: DC | PRN

## 2017-07-12 MED ORDER — OXYTOCIN 40 UNITS IN LACTATED RINGERS INFUSION - SIMPLE MED
1.0000 m[IU]/min | INTRAVENOUS | Status: DC
  Administered 2017-07-12: 2 m[IU]/min via INTRAVENOUS

## 2017-07-12 MED ORDER — PHENYLEPHRINE 40 MCG/ML (10ML) SYRINGE FOR IV PUSH (FOR BLOOD PRESSURE SUPPORT)
80.0000 ug | PREFILLED_SYRINGE | INTRAVENOUS | Status: DC | PRN
Start: 2017-07-12 — End: 2017-07-14
  Administered 2017-07-13: 80 ug via INTRAVENOUS
  Filled 2017-07-12: qty 10

## 2017-07-12 MED ORDER — PENICILLIN G POT IN DEXTROSE 60000 UNIT/ML IV SOLN
3.0000 10*6.[IU] | INTRAVENOUS | Status: DC
  Administered 2017-07-12 – 2017-07-13 (×7): 3 10*6.[IU] via INTRAVENOUS
  Filled 2017-07-12 (×11): qty 50

## 2017-07-12 MED ORDER — FENTANYL 2.5 MCG/ML BUPIVACAINE 1/10 % EPIDURAL INFUSION (WH - ANES)
14.0000 mL/h | INTRAMUSCULAR | Status: DC | PRN
  Administered 2017-07-13 (×3): 14 mL/h via EPIDURAL
  Filled 2017-07-12 (×3): qty 100

## 2017-07-12 MED ORDER — FENTANYL CITRATE (PF) 100 MCG/2ML IJ SOLN
50.0000 ug | INTRAMUSCULAR | Status: DC | PRN
  Administered 2017-07-12: 100 ug via INTRAVENOUS
  Filled 2017-07-12: qty 2

## 2017-07-12 MED ORDER — ACETAMINOPHEN 325 MG PO TABS
650.0000 mg | ORAL_TABLET | ORAL | Status: DC | PRN
  Administered 2017-07-12: 650 mg via ORAL
  Filled 2017-07-12: qty 2

## 2017-07-12 MED ORDER — EPHEDRINE 5 MG/ML INJ: 10.0000 mg | INTRAVENOUS | Status: DC | PRN

## 2017-07-12 MED ORDER — MAGNESIUM SULFATE BOLUS VIA INFUSION
4.0000 g | Freq: Once | INTRAVENOUS | Status: AC
  Administered 2017-07-12: 4 g via INTRAVENOUS
  Filled 2017-07-12: qty 500

## 2017-07-12 MED ORDER — LABETALOL HCL 5 MG/ML IV SOLN
20.0000 mg | INTRAVENOUS | Status: AC | PRN
  Administered 2017-07-12: 20 mg via INTRAVENOUS
  Administered 2017-07-12: 40 mg via INTRAVENOUS
  Administered 2017-07-13: 20 mg via INTRAVENOUS
  Filled 2017-07-12: qty 8
  Filled 2017-07-12 (×2): qty 4

## 2017-07-12 MED ORDER — OXYTOCIN 40 UNITS IN LACTATED RINGERS INFUSION - SIMPLE MED
2.5000 [IU]/h | INTRAVENOUS | Status: DC
Start: 2017-07-12 — End: 2017-07-14
  Filled 2017-07-12: qty 1000

## 2017-07-12 MED ORDER — OXYCODONE-ACETAMINOPHEN 5-325 MG PO TABS: 2.0000 | ORAL_TABLET | ORAL | Status: DC | PRN

## 2017-07-12 MED ORDER — LACTATED RINGERS IV SOLN: 500.0000 mL | INTRAVENOUS | Status: DC | PRN

## 2017-07-12 MED ORDER — FLEET ENEMA 7-19 GM/118ML RE ENEM
1.0000 | ENEMA | Freq: Every day | RECTAL | Status: DC | PRN
Start: 2017-07-12 — End: 2017-07-14

## 2017-07-12 MED ORDER — HYDROXYZINE HCL 50 MG PO TABS: 50.0000 mg | ORAL_TABLET | Freq: Four times a day (QID) | ORAL | Status: DC | PRN

## 2017-07-12 NOTE — Progress Notes (Signed)
Initial Nutrition Assessment  DOCUMENTATION CODES:  Obesity unspecified INTERVENTION:  Gestational Diabetic Diet NUTRITION DIAGNOSIS:  Increased nutrient needs related to  (pregnancy and fetal growth requirements) as evidenced by  (35 week twin IUP). GOAL:  Patient will meet greater than or equal to 90% of their needs MONITOR:  Labs REASON FOR ASSESSMENT:  Gestational Diabetes, Antenatal   ASSESSMENT:  35 2/7 weeks adm due to HTN. GDM, obesity. Wt at initial prenatal visit 210 lbs ( 10 wks) BMI 38.5. 31 lb weight gain  Diet Order:  Diet gestational carb mod Room service appropriate? Yes; Fluid consistency: Thin Height:   Ht Readings from Last 1 Encounters:  07/11/17 5\' 2"  (1.575 m)  Weight:   Wt Readings from Last 1 Encounters:  07/11/17 241 lb 9.6 oz (109.6 kg)  Ideal Body Weight:   110 lbs  BMI:  Body mass index is 44.19 kg/m. Estimated Nutritional Needs:  Kcal:  9355-2174 Protein:  100-110 Fluid:  2.6 L  EDUCATION NEEDS:    (Educ completed 02/04/17, GDM diet)  Weyman Rodney M.Fredderick Severance LDN Neonatal Nutrition Support Specialist/RD III Pager [phone]      Phone [phone]

## 2017-07-12 NOTE — Progress Notes (Signed)
MFM Consult:  I reviewed preeclampsia as a cause of uteroplacental insufficiency, with increased risk of IUGR, oligohydramnios, and stillbirth. I told her that her GHTN also places her at increased risk for severe preeclampsia/eclampsia, describing the triad of increased blood pressure, proteinuria, and abnormal edema. Lastly, hypertension requiring antihypertensive therapy increases the risk of placental abruption, especially in the setting of twin gestation.  Moreover, given that she is having neurological symptoms of a headache all day today that is central, characterized by pressure and dull ache, and having difficulty focusing she has a severe feature.    I reviewed the essential tenets in the most recent guidelines for management of hypertension in pregnancy in accordance with the [place] of Obstetrics and Gynecology expert opinion. We talked about the medical treatment of hypertension in pregnancy. If severe hypertension (>160/110) is noted, she should have adjustment of her antihypertensive regimen/treatment with IV antihypertensive.  Given GHTN with severe feature of neurologic symptom (headache that is persistent) at >[redacted] weeks gestation, delivery is warranted.  I discussed with Dr. Harolyn Rutherford and the patient at length spending 60 minutes, 100% in face-to-face encounter with the patient.  She understands rationale for delivery and use of magnesium sulfate for eclampsia prophylaxis.  Recommendations: 1. Delivery for [facility] with severe features 2. MgSO4 prophylaxis until 24 hours postpartum 3. Adjustment of antihypertensive regimen/treatment with IV antihypertensive for severe HTN only (eg, 160/110 or greater).   Thank you for allowing me to participate in the care of this patient.  Delman Cheadle Harl Favor, Delman Cheadle, MD, MS, FACOG Assistant Professor Section of [place]

## 2017-07-12 NOTE — Progress Notes (Addendum)
Daily Antepartum Note  Admission Date: 07/11/2017 Current Date: 07/12/2017 7:42 AM  Cynthia [name] is a 36 y.o. G2P0010 @ [redacted]w[redacted]d, HD#2, admitted for gestational HTN vs pre-eclampsia work up.  Pregnancy complicated by: Patient Active Problem List   Diagnosis Date Noted  . Gestational hypertension 06/13/2017  . Gestational hypertension w/o significant proteinuria in 2nd trimester 05/28/2017  . Twin gestation, dichorionic diamniotic 05/07/2017  . Gestational diabetes 01/23/2017  . GBS bacteriuria 01/19/2017  . Supervision of high risk primigravida in second trimester in patient 35 years or older at time of delivery 01/16/2017  . Pelvic adhesive disease 06/17/2015    Class: Present on Admission  . Endometrioma of ovary 06/17/2015    Class: Present on Admission  . Menometrorrhagia 12/20/2014  . Anemia 12/20/2014  . Hyperlipidemia 11/22/2014  . Severe obesity (BMI >= 40) ([place]) 11/22/2014  . Anxiety state 11/22/2014    Overnight/24hr events:  No issues  Subjective:  No s/s of pre-eclampsia.   Objective:    Current Vital Signs 24h Vital Sign Ranges  T 98.3 F (36.8 C) Temp  Avg: 98 F (36.7 C)  Min: 97.7 F (36.5 C)  Max: 98.3 F (36.8 C)  BP 140/77 BP  Min: 130/79  Max: 156/99  HR 85 Pulse  Avg: 90.7  Min: 83  Max: 102  RR 16 Resp  Avg: 17.4  Min: 16  Max: 20  SaO2 98 % Not Delivered SpO2  Avg: 99.1 %  Min: 98 %  Max: 100 %       24 Hour I/O Current Shift I/O  Time Ins Outs 08/02 0701 - 08/03 0700 In: 1620 [P.O.:1620] Out: 850 [Urine:850] No intake/output data recorded.   Patient Vitals for the past 24 hrs:  BP Temp Temp src Pulse Resp SpO2 Height Weight  07/12/17 0402 140/77 98.3 F (36.8 C) Oral 85 16 98 % - -  07/12/17 0000 130/79 97.9 F (36.6 C) Oral 94 20 100 % - -  07/11/17 2018 (!) 143/93 98.1 F (36.7 C) Oral 93 18 100 % - -  07/11/17 1601 (!) 144/97 98 F (36.7 C) Oral 92 16 99 % - -  07/11/17 1456 (!) 149/99 - - 86 - - - -  07/11/17 1454 138/88 - - 87 -  - - -  07/11/17 1429 (!) 150/105 97.9 F (36.6 C) Oral 95 - 100 % 5\' 2"  (1.575 m) 109.6 kg (241 lb 9.6 oz)  07/11/17 1331 (!) 143/90 - - 86 - - - -  07/11/17 1316 (!) 146/106 - - 85 - - - -  07/11/17 1301 (!) 148/97 - - 87 - - - -  07/11/17 1246 (!) 156/99 - - 88 - - - -  07/11/17 1232 (!) 140/103 - - 83 - - - -  07/11/17 1221 (!) 155/107 - - 94 - - - -  07/11/17 1216 (!) 148/109 - - 90 - - - -  07/11/17 1214 (!) 153/99 - - 93 - - - -  07/11/17 1103 - - - - - 100 % - -  07/11/17 1101 (!) 152/99 - - 88 - - - -  07/11/17 1046 137/84 - - 90 - 98 % - -  07/11/17 1030 135/89 - - 89 - 99 % - -  07/11/17 1016 138/87 - - 88 - - - -  07/11/17 1001 138/83 - - 85 - - - -  07/11/17 0946 131/86 - - 88 - - - -  07/11/17 5329 Marland Kitchen)  147/89 - - (!) 101 - 99 % - -  07/11/17 0922 - - - - - 99 % - -  07/11/17 0917 - - - - - 99 % - -  07/11/17 0916 (!) 137/95 - - 96 - - - -  07/11/17 0912 - - - - - 99 % - -  07/11/17 0907 - - - - - 99 % - -  07/11/17 0902 - - - - - 99 % - -  07/11/17 0901 (!) 144/97 - - 98 - - - -  07/11/17 0857 - - - - - 99 % - -  07/11/17 0847 (!) 139/93 - - 95 - 98 % - -  07/11/17 0833 (!) 137/91 97.7 F (36.5 C) Oral (!) 102 17 100 % - -    Physical exam: General: Well nourished, well developed female in no acute distress. Abdomen: gravid nttp Cardiovascular: S1, S2 normal, no murmur, rub or gallop, regular rate and rhythm Respiratory: CTAB Extremities: no clubbing, cyanosis or edema Skin: Warm and dry.   Medications: Current Facility-Administered Medications  Medication Dose Route Frequency Provider Last Rate Last Dose  . 0.9 %  sodium chloride infusion  250 mL Intravenous PRN Aletha Halim, MD      . acetaminophen (TYLENOL) tablet 650 mg  650 mg Oral Q4H PRN Aletha Halim, MD      . aspirin EC tablet 81 mg  81 mg Oral Daily Aletha Halim, MD   81 mg at 07/11/17 2158  . docusate sodium (COLACE) capsule 100 mg  100 mg Oral BID PRN Aletha Halim, MD      .  doxylamine (Sleep) (UNISOM) tablet 50 mg  50 mg Oral QHS PRN Aletha Halim, MD   50 mg at 07/11/17 2237  . labetalol (NORMODYNE) tablet 200 mg  200 mg Oral BID Aletha Halim, MD   200 mg at 07/11/17 2158  . prenatal vitamin w/FE, FA (PRENATAL 1 + 1) 27-1 MG tablet 1 tablet  1 tablet Oral Q1200 Aletha Halim, MD   1 tablet at 07/11/17 2158  . sodium chloride flush (NS) 0.9 % injection 3 mL  3 mL Intravenous Q12H Aletha Halim, MD   3 mL at 07/11/17 2240  . sodium chloride flush (NS) 0.9 % injection 3 mL  3 mL Intravenous PRN Aletha Halim, MD      . zolpidem (AMBIEN) tablet 5 mg  5 mg Oral QHS PRN Aletha Halim, MD        Labs:   Recent Labs Lab 07/10/17 1142 07/11/17 1259  WBC 8.4 8.4  HGB 11.8 11.8*  HCT 35.8 34.9*  PLT 305 287     Recent Labs Lab 07/10/17 1142 07/11/17 1259  NA 138 135  K 4.2 4.0  CL 106 104  CO2 17* 22  BUN 8 12  CREATININE 0.71 0.71  CALCIUM 8.7 9.5  PROT 5.7* 6.0*  BILITOT 0.4 0.6  ALKPHOS 223* 194*  ALT 12 16  AST 19 23  GLUCOSE 71 74   Results for Cynthia, [name] (MRN [id number]) as of 07/12/2017 07:41  Ref. Range 07/11/2017 17:22 07/11/2017 20:14 07/12/2017 06:11  Glucose-Capillary Latest Ref Range: 65 - 99 mg/dL 97 86 76   Radiology: no new imaging.   Assessment & Plan:  Pt stable *Pregnancy: daily NSTs.  *gHTN: 24h urine collection done this afternoon. Improved BPs with increased labetalol. Recommend MFM consult to go over delivery plan/timing.  *GDMa1: normal BS values.  *Preterm: no issues. S/p BMZ on  7/5-6 *PPx: SCDs, OOB ad lib *FEN/GI: DM diet, saline lock IV *Dispo: pending 24h and MFM consult  Durene Romans MD Attending Center for [facility])

## 2017-07-12 NOTE — Progress Notes (Signed)
Labor Progress Note Cynthia [name] is a 36 y.o. G2P0010 at [redacted]w[redacted]d presented for IOL for Pre-E w/ severe fetures by BP and HA S: Headche improved  O:  BP (!) 142/89   Pulse 89   Temp 98 F (36.7 C) (Oral)   Resp 18   Ht 5\' 2"  (1.575 m)   Wt 241 lb 9.6 oz (109.6 kg)   LMP 02/12/2016   SpO2 97%   BMI 44.19 kg/m  EFM: Baby A: 135/moderate/+accel/ -decel  BabyB: 135/moderate/+accel/ -decel  CVE: Dilation: 4 Effacement (%): 80 Cervical Position: Posterior Station: -3 Presentation: Vertex Exam by:: degele   DTR+ Appropriate UOP   A&P: 36 y.o. G2P0010 [redacted]w[redacted]d IOL for Pre E w/ SF #Labor: Progressing well. Continue pitocin titration #Pain: IV pain medication. Epidural when in active labor #FWB: A cat 1 B cat 1 #GBS positive Kathi Der twins Pre-E w/ SF: BP stable s/p labetalol x2.  GDMA1: continue to monitor blood sugar Q2 (chronic/other problems)  Windell Moment, MD 9:56 PM

## 2017-07-12 NOTE — Progress Notes (Signed)
Patient is at [redacted]w[redacted]d with di/di twims, GHTN on Labetalol, A1GDM.  MFM consulted given concern about delivery plan. On encounter with MFM, patient reported having headaches.  This is a severe feature; therefore, MFM (Dr. Margurite Auerbach) recommended IOL.  Last presentation was cephalic x 2; last cervical exam was 4/70/posterior.  Patient will be sent to L&D for IOL now, will also start magnesium sulfate for eclampsia prophylaxis. Patient is agreeable to this plan.  Appreciate Dr. Ezzard Flax help in management of this patient.   Verita Schneiders, MD, [place] Attending [place], [facility] for [employer], [place]

## 2017-07-12 NOTE — Progress Notes (Signed)
Hypoglycemic Event  CBG:68   Treatment: 15 GM carbohydrate snack  Symptoms: None  Follow-up CBG: Time:1052  CBG Result:85   Possible Reasons for Event: Unknown  2hr pp CBG was late, actually done at 3 hr.  Comments/MD notified: MD notified via message to another RN while MD in delivery.    Marry Guan

## 2017-07-12 NOTE — Anesthesia Pain Management Evaluation Note (Signed)
  CRNA Pain Management Visit Note  Patient: Cynthia [name], 36 y.o., female  "Hello I am a member of the anesthesia team at [facility]. We have an anesthesia team available at all times to provide care throughout the hospital, including epidural management and anesthesia for C-section. I don't know your plan for the delivery whether it a natural birth, water birth, IV sedation, nitrous supplementation, doula or epidural, but we want to meet your pain goals."   1.Was your pain managed to your expectations on prior hospitalizations?   No prior hospitalizations  2.What is your expectation for pain management during this hospitalization?     Epidural  3.How can we help you reach that goal? Epidural when ready.  Record the patient's initial score and the patient's pain goal.   Pain: 3  Pain Goal: 5 [facility] wants you to be able to say your pain was always managed very well.  Alauna Hayden L 07/12/2017

## 2017-07-12 NOTE — Progress Notes (Signed)
MFM Consult:  I reviewed preeclampsia as a cause of uteroplacental insufficiency, with increased risk of IUGR, oligohydramnios, and stillbirth. I told her that her GHTN also places her at increased risk for severe preeclampsia/eclampsia, describing the triad of increased blood pressure, proteinuria, and abnormal edema. Lastly, hypertension requiring antihypertensive therapy increases the risk of placental abruption, especially in the setting of twin gestation.  Moreover, given that she is having neurological symptoms of a headache all day today that is central, characterized by pressure and dull ache, and having difficulty focusing she has a severe feature.    I reviewed the essential tenets in the most recent guidelines for management of hypertension in pregnancy in accordance with the [place] of Obstetrics and Gynecology expert opinion. We talked about the medical treatment of hypertension in pregnancy. If severe hypertension (>160/110) is noted, she should have adjustment of her antihypertensive regimen/treatment with IV antihypertensive.  Given GHTN with severe feature of neurologic symptom (headache that is persistent) at >[redacted] weeks gestation, delivery is warranted.  I discussed with Dr. Harolyn Rutherford and the patient at length spending 60 minutes, 100% in face-to-face encounter with the patient.  She understands rationale for delivery and use of magnesium sulfate for eclampsia prophylaxis.  Recommendations: 1. Delivery for [facility] with severe features 2. MgSO4 prophylaxis until 24 hours postpartum 3. Adjustment of antihypertensive regimen/treatment with IV antihypertensive for severe HTN only (eg, 160/110 or greater).   Thank you for allowing me to participate in the care of this patient.  Delman Cheadle Harl Favor, Delman Cheadle, MD, MS, FACOG Assistant Professor Section of [place]

## 2017-07-12 NOTE — Consult Note (Signed)
MFM Consult:  I reviewed preeclampsia as a cause of uteroplacental insufficiency, with increased risk of IUGR, oligohydramnios, and stillbirth. I told her that her GHTN also places her at increased risk for severe preeclampsia/eclampsia, describing the triad of increased blood pressure, proteinuria, and abnormal edema. Lastly, hypertension requiring antihypertensive therapy increases the risk of placental abruption, especially in the setting of twin gestation.  Moreover, given that she is having neurological symptoms of a headache all day today that is central, characterized by pressure and dull ache, and having difficulty focusing she has a severe feature.    I reviewed the essential tenets in the most recent guidelines for management of hypertension in pregnancy in accordance with the [place] of Obstetrics and Gynecology expert opinion. We talked about the medical treatment of hypertension in pregnancy. If severe hypertension (>160/110) is noted, she should have adjustment of her antihypertensive regimen/treatment with IV antihypertensive.  Given GHTN with severe feature of neurologic symptom (headache that is persistent) at >[redacted] weeks gestation, delivery is warranted.  I discussed with Dr. Harolyn Rutherford and the patient at length spending 60 minutes, 100% in face-to-face encounter with the patient.  She understands rationale for delivery and use of magnesium sulfate for eclampsia prophylaxis.  Recommendations: 1. Delivery for [facility] with severe features 2. MgSO4 prophylaxis until 24 hours postpartum 3. Adjustment of antihypertensive regimen/treatment with IV antihypertensive for severe HTN only (eg, 160/110 or greater).   Thank you for allowing me to participate in the care of this patient.  Delman Cheadle Harl Favor, Delman Cheadle, MD, MS, FACOG Assistant Professor Section of [place]

## 2017-07-13 ENCOUNTER — Inpatient Hospital Stay (HOSPITAL_COMMUNITY): Payer: 59 | Admitting: Anesthesiology

## 2017-07-13 ENCOUNTER — Encounter (HOSPITAL_COMMUNITY): Payer: Self-pay | Admitting: Obstetrics & Gynecology

## 2017-07-13 LAB — CBC
HCT: 34.3 % — ABNORMAL LOW (ref 36.0–46.0)
HCT: 35.3 % — ABNORMAL LOW (ref 36.0–46.0)
Hemoglobin: 11.5 g/dL — ABNORMAL LOW (ref 12.0–15.0)
Hemoglobin: 11.8 g/dL — ABNORMAL LOW (ref 12.0–15.0)
MCH: 29.7 pg (ref 26.0–34.0)
MCH: 29.8 pg (ref 26.0–34.0)
MCHC: 33.4 g/dL (ref 30.0–36.0)
MCHC: 33.5 g/dL (ref 30.0–36.0)
MCV: 88.6 fL (ref 78.0–100.0)
MCV: 89.1 fL (ref 78.0–100.0)
Platelets: 290 K/uL (ref 150–400)
Platelets: 317 K/uL (ref 150–400)
RBC: 3.87 MIL/uL (ref 3.87–5.11)
RBC: 3.96 MIL/uL (ref 3.87–5.11)
RDW: 13.8 % (ref 11.5–15.5)
RDW: 13.9 % (ref 11.5–15.5)
WBC: 8.3 K/uL (ref 4.0–10.5)
WBC: 8.7 K/uL (ref 4.0–10.5)

## 2017-07-13 LAB — GLUCOSE, CAPILLARY
GLUCOSE-CAPILLARY: 101 mg/dL — AB (ref 65–99)
Glucose-Capillary: 90 mg/dL (ref 65–99)
Glucose-Capillary: 79 mg/dL (ref 65–99)

## 2017-07-13 LAB — COMPREHENSIVE METABOLIC PANEL WITH GFR
ALT: 17 U/L (ref 14–54)
AST: 24 U/L (ref 15–41)
Albumin: 2.3 g/dL — ABNORMAL LOW (ref 3.5–5.0)
Alkaline Phosphatase: 208 U/L — ABNORMAL HIGH (ref 38–126)
Anion gap: 10 (ref 5–15)
BUN: 13 mg/dL (ref 6–20)
CO2: 21 mmol/L — ABNORMAL LOW (ref 22–32)
Calcium: 8.3 mg/dL — ABNORMAL LOW (ref 8.9–10.3)
Chloride: 102 mmol/L (ref 101–111)
Creatinine, Ser: 0.71 mg/dL (ref 0.44–1.00)
GFR calc Af Amer: >60 mL/min
GFR calc non Af Amer: >60 mL/min
Glucose, Bld: 89 mg/dL (ref 65–99)
Potassium: 4.6 mmol/L (ref 3.5–5.1)
Sodium: 133 mmol/L — ABNORMAL LOW (ref 135–145)
Total Bilirubin: 0.8 mg/dL (ref 0.3–1.2)
Total Protein: 5.9 g/dL — ABNORMAL LOW (ref 6.5–8.1)

## 2017-07-13 LAB — PROTEIN / CREATININE RATIO, URINE
Creatinine, Urine: 65 mg/dL
Protein Creatinine Ratio: 0.15 mg/mg{creat} (ref 0.00–0.15)
Total Protein, Urine: 10 mg/dL

## 2017-07-13 LAB — RPR: RPR Ser Ql: NONREACTIVE

## 2017-07-13 LAB — MAGNESIUM: Magnesium: 5.6 mg/dL — ABNORMAL HIGH (ref 1.7–2.4)

## 2017-07-13 MED ORDER — LABETALOL HCL 5 MG/ML IV SOLN
INTRAVENOUS | Status: AC
  Filled 2017-07-13: qty 8

## 2017-07-13 MED ORDER — LIDOCAINE HCL 2 % EX GEL
1.0000 "application " | Freq: Once | CUTANEOUS | Status: AC
  Administered 2017-07-13: 1 via TOPICAL
  Filled 2017-07-13: qty 5

## 2017-07-13 MED ORDER — LIDOCAINE HCL (PF) 1 % IJ SOLN
INTRAMUSCULAR | Status: DC | PRN
  Administered 2017-07-13: 5 mL
  Administered 2017-07-13: 2 mL
  Administered 2017-07-13: 3 mL

## 2017-07-13 MED ORDER — PHENYLEPHRINE 40 MCG/ML (10ML) SYRINGE FOR IV PUSH (FOR BLOOD PRESSURE SUPPORT): 80.0000 ug | PREFILLED_SYRINGE | INTRAVENOUS | Status: DC | PRN

## 2017-07-13 MED ORDER — LACTATED RINGERS IV SOLN: 500.0000 mL | Freq: Once | INTRAVENOUS | Status: DC

## 2017-07-13 MED ORDER — EPHEDRINE 5 MG/ML INJ: 10.0000 mg | INTRAVENOUS | Status: DC | PRN

## 2017-07-13 NOTE — Progress Notes (Signed)
Anesthesia and Resident aware of PIH s/s.

## 2017-07-13 NOTE — Anesthesia Preprocedure Evaluation (Signed)
Anesthesia Evaluation  Patient identified by MRN, date of birth, ID band Patient awake    Reviewed: Allergy & Precautions, Patient's Chart, lab work & pertinent test results  Airway Mallampati: III  TM Distance: >3 FB     Dental   Pulmonary former smoker,    breath sounds clear to auscultation       Cardiovascular hypertension,  Rhythm:Regular Rate:Normal     Neuro/Psych    GI/Hepatic negative GI ROS, Neg liver ROS,   Endo/Other  diabetes, GestationalMorbid obesity  Renal/GU negative Renal ROS     Musculoskeletal   Abdominal   Peds  Hematology negative hematology ROS (+)   Anesthesia Other Findings   Reproductive/Obstetrics (+) Pregnancy (Twins gestation)                             Lab Results  Component Value Date   WBC 8.3 07/13/2017   HGB 11.8 (L) 07/13/2017   HCT 35.3 (L) 07/13/2017   MCV 89.1 07/13/2017   PLT 290 07/13/2017   Lab Results  Component Value Date   CREATININE 0.71 07/13/2017   BUN 13 07/13/2017   NA 133 (L) 07/13/2017   K 4.6 07/13/2017   CL 102 07/13/2017   CO2 21 (L) 07/13/2017    Anesthesia Physical Anesthesia Plan  ASA: III  Anesthesia Plan: Epidural   Post-op Pain Management:    Induction:   PONV Risk Score and Plan: Treatment may vary due to age or medical condition  Airway Management Planned: Natural Airway  Additional Equipment:   Intra-op Plan:   Post-operative Plan:   Informed Consent: I have reviewed the patients History and Physical, chart, labs and discussed the procedure including the risks, benefits and alternatives for the proposed anesthesia with the patient or authorized representative who has indicated his/her understanding and acceptance.     Plan Discussed with:   Anesthesia Plan Comments:         Anesthesia Quick Evaluation

## 2017-07-13 NOTE — Progress Notes (Signed)
Patient ID: Cynthia [name], female   DOB: 1981-10-13, 36 y.o.   MRN: [id number] AROM Twin A clear fluids FSE placed 5-6/70-80%/-3 Continue with expectant management

## 2017-07-13 NOTE — Progress Notes (Signed)
POC is for pt to receive an epidural then AROM.  Awaiting Lab to obtain new blood values.

## 2017-07-13 NOTE — Progress Notes (Addendum)
Labor Progress Note Cynthia [name] is a 36 y.o. G2P0010 at [redacted]w[redacted]d presented for IOL for Pre-E w/ severe fetures by BP and HA S: Feeling restless. Significant blood noted in Cath bag with moderate UOP  O:  BP (!) 143/91   Pulse 80   Temp 98.2 F (36.8 C) (Oral)   Resp 15   Ht 5\' 2"  (1.575 m)   Wt 241 lb (109.3 kg)   LMP 02/12/2016   SpO2 97%   BMI 44.08 kg/m  EFM: Baby A: 125/moderate/+accel/ -decel  BabyB: 115/moderate/+accel/ -decel  CVE: Dilation: 5 Effacement (%): 80 Cervical Position: Middle Station: -2 Presentation: Vertex Exam by:: Carter Kitten RNC   DTR+ Decreasing UOP   A&P: 36 y.o. G2P0010 [redacted]w[redacted]d IOL for Pre E w/ SF #Labor: Continue pitocin titration. SVE unchanged. Discussing C/S vs continuing induction #Pain: IV pain medication. Epidural when in active labor #FWB: A cat 1 B cat 1 #GBS positive Kathi Der twins Pre-E w/ SF: BP stable-mild range, So symptoms of SF. UOP had continued to decrease. Bladder scan showed ~300 cc. Plan to flush and reassess. Mg level.  GDMA1: continue to monitor blood sugar Q2   Windell Moment, MD 9:33 PM

## 2017-07-13 NOTE — Progress Notes (Signed)
Patient ID: Cynthia [name], female   DOB: [birth year], 36 y.o.   MRN: [id number] OB Attending  Pt feeling some vaginal burning  SVE unchanged C section recommended d/t to arrest of descent and dilatation R/B/Post op care reviewed Anesthesia and nursing aware. Will proceed when OR available

## 2017-07-13 NOTE — Progress Notes (Signed)
Dr Rip Harbour discussing plan of care with pt and husband; plan to perform a c/s; pt and husband verbalized understanding.

## 2017-07-13 NOTE — Progress Notes (Signed)
Labor Progress Note Cynthia [name] is a 36 y.o. G2P0010 at [redacted]w[redacted]d presented for IOL for Pre-E w/ severe fetures by BP and HA S: Still having headaches. Not improving with tylenol  O:  BP (!) 139/95   Pulse 71   Temp 98.2 F (36.8 C) (Oral)   Resp 18   Ht 5\' 2"  (1.575 m)   Wt 241 lb 9.6 oz (109.6 kg)   LMP 02/12/2016   SpO2 97%   BMI 44.19 kg/m  EFM: Baby A: 115/moderate/+accel/ -decel  BabyB: 115/moderate/+accel/ -decel  CVE: Dilation: 4 Effacement (%): 80 Cervical Position: Middle Station: -1 Presentation: Vertex Exam by:: Tia Masker, RN   DTR+ Appropriate UOP   A&P: 36 y.o. G2P0010 [redacted]w[redacted]d IOL for Pre E w/ SF #Labor: Progressing well. Continue pitocin titration. Consider AROM at nect check #Pain: IV pain medication. Epidural when in active labor #FWB: A cat 1 B cat 1 #GBS positive Kathi Der twins Pre-E w/ SF: BP stable-mild range s/p labetalol x2.  GDMA1: continue to monitor blood sugar Q2 (chronic/other problems)  Windell Moment, MD 2:18 AM

## 2017-07-13 NOTE — Progress Notes (Signed)
Patient ID: Cynthia [name], female   DOB: 1981-01-14, 36 y.o.   MRN: [id number] Pt feeling some vaginal burning. SVE unchanged Tx options reviewed with pt and husband C section recommended  R/B/Post op care reviewed. Nursing and anesthesia notified. Will proceed when OR available

## 2017-07-13 NOTE — Progress Notes (Signed)
Dr Rip Harbour at bedside and discussing POC with pt and [name].  Answered extensive questions for parents.  Parents concerned about blood pressures and medicines to treat HTN.  Reviewed protocols for PIH.  Discussed C/S POC.  Answered questions.  Will discuss with pt AROM vs. C/S.

## 2017-07-13 NOTE — Anesthesia Procedure Notes (Signed)
Epidural Patient location during procedure: OB Start time: 07/13/2017 8:00 AM End time: 07/13/2017 8:09 AM  Staffing Anesthesiologist: Suzette Battiest Performed: anesthesiologist   Preanesthetic Checklist Completed: patient identified, site marked, surgical consent, pre-op evaluation, timeout performed, IV checked, risks and benefits discussed and monitors and equipment checked  Epidural Patient position: sitting Prep: site prepped and draped and DuraPrep Patient monitoring: continuous pulse ox and blood pressure Approach: midline Location: L3-L4 Injection technique: LOR air  Needle:  Needle type: Tuohy  Needle gauge: 17 G Needle length: 9 cm and 9 Needle insertion depth: 7 cm Catheter type: closed end flexible Catheter size: 19 Gauge Catheter at skin depth: 13 (12-->13cm when laid in lat decub position.) cm Test dose: negative  Assessment Events: blood not aspirated, injection not painful, no injection resistance, negative IV test and no paresthesia

## 2017-07-14 ENCOUNTER — Encounter (HOSPITAL_COMMUNITY): Payer: Self-pay | Admitting: Obstetrics and Gynecology

## 2017-07-14 ENCOUNTER — Encounter (HOSPITAL_COMMUNITY)
Admission: AD | Disposition: A | Discharge status: Home / Self Care | Payer: Self-pay | Source: Ambulatory Visit | Attending: Obstetrics and Gynecology

## 2017-07-14 DIAGNOSIS — O24429 Gestational diabetes mellitus in childbirth, unspecified control: Secondary | ICD-10-CM | POA: Diagnosis not present

## 2017-07-14 DIAGNOSIS — O321XX2 Maternal care for breech presentation, fetus 2: Secondary | ICD-10-CM | POA: Diagnosis not present

## 2017-07-14 DIAGNOSIS — O99824 Streptococcus B carrier state complicating childbirth: Secondary | ICD-10-CM | POA: Diagnosis not present

## 2017-07-14 DIAGNOSIS — Z6841 Body Mass Index (BMI) 40.0 and over, adult: Secondary | ICD-10-CM | POA: Diagnosis not present

## 2017-07-14 DIAGNOSIS — O30043 Twin pregnancy, dichorionic/diamniotic, third trimester: Secondary | ICD-10-CM | POA: Diagnosis not present

## 2017-07-14 DIAGNOSIS — O24420 Gestational diabetes mellitus in childbirth, diet controlled: Secondary | ICD-10-CM | POA: Diagnosis not present

## 2017-07-14 DIAGNOSIS — O324XX0 Maternal care for high head at term, not applicable or unspecified: Secondary | ICD-10-CM | POA: Diagnosis not present

## 2017-07-14 DIAGNOSIS — O1494 Unspecified pre-eclampsia, complicating childbirth: Secondary | ICD-10-CM | POA: Diagnosis not present

## 2017-07-14 DIAGNOSIS — Z3A35 35 weeks gestation of pregnancy: Secondary | ICD-10-CM | POA: Diagnosis not present

## 2017-07-14 DIAGNOSIS — O24419 Gestational diabetes mellitus in pregnancy, unspecified control: Secondary | ICD-10-CM

## 2017-07-14 DIAGNOSIS — Z7982 Long term (current) use of aspirin: Secondary | ICD-10-CM | POA: Diagnosis not present

## 2017-07-14 LAB — CBC
HCT: 27.6 % — ABNORMAL LOW (ref 36.0–46.0)
Hemoglobin: 9.5 g/dL — ABNORMAL LOW (ref 12.0–15.0)
MCH: 30.6 pg (ref 26.0–34.0)
MCHC: 34.4 g/dL (ref 30.0–36.0)
MCV: 89 fL (ref 78.0–100.0)
PLATELETS: 270 10*3/uL (ref 150–400)
RBC: 3.1 MIL/uL — AB (ref 3.87–5.11)
RDW: 13.8 % (ref 11.5–15.5)
WBC: 12.5 10*3/uL — AB (ref 4.0–10.5)

## 2017-07-14 LAB — GLUCOSE, CAPILLARY: GLUCOSE-CAPILLARY: 144 mg/dL — AB (ref 65–99)

## 2017-07-14 LAB — MAGNESIUM: MAGNESIUM: 6.1 mg/dL — AB (ref 1.7–2.4)

## 2017-07-14 SURGERY — Surgical Case
Anesthesia: Epidural

## 2017-07-14 MED ORDER — HYDRALAZINE HCL 20 MG/ML IJ SOLN
10.0000 mg | Freq: Once | INTRAMUSCULAR | Status: AC
  Administered 2017-07-14: 10 mg via INTRAVENOUS
  Filled 2017-07-14: qty 1

## 2017-07-14 MED ORDER — SODIUM BICARBONATE 8.4 % IV SOLN
INTRAVENOUS | Status: AC
  Filled 2017-07-14: qty 50

## 2017-07-14 MED ORDER — ACETAMINOPHEN 325 MG PO TABS: 650.0000 mg | ORAL_TABLET | ORAL | Status: DC | PRN

## 2017-07-14 MED ORDER — LACTATED RINGERS IV SOLN
INTRAVENOUS | Status: DC | PRN
  Administered 2017-07-14: 03:00:00 via INTRAVENOUS

## 2017-07-14 MED ORDER — SODIUM BICARBONATE 8.4 % IV SOLN
INTRAVENOUS | Status: DC | PRN
  Administered 2017-07-14 (×2): 5 mL via EPIDURAL

## 2017-07-14 MED ORDER — FUROSEMIDE 10 MG/ML IJ SOLN
INTRAMUSCULAR | Status: DC | PRN
  Administered 2017-07-14: 20 mg via INTRAMUSCULAR

## 2017-07-14 MED ORDER — KETOROLAC TROMETHAMINE 30 MG/ML IJ SOLN
INTRAMUSCULAR | Status: DC | PRN
  Administered 2017-07-14: 30 mg via INTRAVENOUS

## 2017-07-14 MED ORDER — KETOROLAC TROMETHAMINE 30 MG/ML IJ SOLN
INTRAMUSCULAR | Status: AC
Start: 2017-07-14 — End: 2017-07-14
  Filled 2017-07-14: qty 1

## 2017-07-14 MED ORDER — FUROSEMIDE 10 MG/ML IJ SOLN
20.0000 mg | Freq: Two times a day (BID) | INTRAMUSCULAR | Status: AC
  Administered 2017-07-14 (×2): 20 mg via INTRAVENOUS
  Filled 2017-07-14 (×2): qty 2

## 2017-07-14 MED ORDER — WITCH HAZEL-GLYCERIN EX PADS: 1.0000 "application " | MEDICATED_PAD | CUTANEOUS | Status: DC | PRN

## 2017-07-14 MED ORDER — SIMETHICONE 80 MG PO CHEW
80.0000 mg | CHEWABLE_TABLET | ORAL | Status: DC
  Administered 2017-07-15 – 2017-07-17 (×3): 80 mg via ORAL
  Filled 2017-07-14 (×3): qty 1

## 2017-07-14 MED ORDER — SIMETHICONE 80 MG PO CHEW
80.0000 mg | CHEWABLE_TABLET | Freq: Three times a day (TID) | ORAL | Status: DC
  Administered 2017-07-14 – 2017-07-17 (×9): 80 mg via ORAL
  Filled 2017-07-14 (×10): qty 1

## 2017-07-14 MED ORDER — OXYTOCIN 40 UNITS IN LACTATED RINGERS INFUSION - SIMPLE MED: 2.5000 [IU]/h | INTRAVENOUS | Status: AC

## 2017-07-14 MED ORDER — MORPHINE SULFATE (PF) 0.5 MG/ML IJ SOLN
INTRAMUSCULAR | Status: AC
  Filled 2017-07-14: qty 10

## 2017-07-14 MED ORDER — PHENYLEPHRINE 40 MCG/ML (10ML) SYRINGE FOR IV PUSH (FOR BLOOD PRESSURE SUPPORT)
PREFILLED_SYRINGE | INTRAVENOUS | Status: AC
  Filled 2017-07-14: qty 20

## 2017-07-14 MED ORDER — LACTATED RINGERS IV SOLN
INTRAVENOUS | Status: DC
  Administered 2017-07-14 – 2017-07-15 (×2): via INTRAVENOUS

## 2017-07-14 MED ORDER — DIPHENHYDRAMINE HCL 25 MG PO CAPS
25.0000 mg | ORAL_CAPSULE | Freq: Four times a day (QID) | ORAL | Status: DC | PRN
  Administered 2017-07-14 – 2017-07-16 (×2): 25 mg via ORAL
  Filled 2017-07-14 (×2): qty 1

## 2017-07-14 MED ORDER — TETANUS-DIPHTH-ACELL PERTUSSIS 5-2.5-18.5 LF-MCG/0.5 IM SUSP: 0.5000 mL | Freq: Once | INTRAMUSCULAR | Status: DC

## 2017-07-14 MED ORDER — LIDOCAINE-EPINEPHRINE (PF) 2 %-1:200000 IJ SOLN
INTRAMUSCULAR | Status: AC
  Filled 2017-07-14: qty 20

## 2017-07-14 MED ORDER — FENTANYL CITRATE (PF) 100 MCG/2ML IJ SOLN: 25.0000 ug | INTRAMUSCULAR | Status: DC | PRN

## 2017-07-14 MED ORDER — IBUPROFEN 600 MG PO TABS
600.0000 mg | ORAL_TABLET | Freq: Four times a day (QID) | ORAL | Status: DC
  Administered 2017-07-15 – 2017-07-17 (×9): 600 mg via ORAL
  Filled 2017-07-14 (×9): qty 1

## 2017-07-14 MED ORDER — SODIUM CHLORIDE 0.9 % IV SOLN
INTRAVENOUS | Status: DC | PRN
  Administered 2017-07-14: 60 ug/min via INTRAVENOUS

## 2017-07-14 MED ORDER — FUROSEMIDE 10 MG/ML IJ SOLN
INTRAMUSCULAR | Status: AC
  Filled 2017-07-14: qty 2

## 2017-07-14 MED ORDER — PHENYLEPHRINE HCL 10 MG/ML IJ SOLN
INTRAMUSCULAR | Status: DC | PRN
  Administered 2017-07-14: 80 ug via INTRAVENOUS
  Administered 2017-07-14 (×4): 40 ug via INTRAVENOUS

## 2017-07-14 MED ORDER — COCONUT OIL OIL: 1.0000 "application " | TOPICAL_OIL | Status: DC | PRN

## 2017-07-14 MED ORDER — HYDRALAZINE HCL 20 MG/ML IJ SOLN: 10.0000 mg | INTRAMUSCULAR | Status: DC | PRN

## 2017-07-14 MED ORDER — OXYTOCIN 10 UNIT/ML IJ SOLN
INTRAMUSCULAR | Status: AC
  Filled 2017-07-14: qty 4

## 2017-07-14 MED ORDER — POTASSIUM CHLORIDE CRYS ER 20 MEQ PO TBCR
20.0000 meq | EXTENDED_RELEASE_TABLET | Freq: Two times a day (BID) | ORAL | Status: AC
  Administered 2017-07-14 (×2): 20 meq via ORAL
  Filled 2017-07-14 (×2): qty 1

## 2017-07-14 MED ORDER — DIPHENHYDRAMINE HCL 50 MG/ML IJ SOLN
INTRAMUSCULAR | Status: AC
  Administered 2017-07-14: 12.5 mg via INTRAVENOUS
  Filled 2017-07-14: qty 1

## 2017-07-14 MED ORDER — ZOLPIDEM TARTRATE 5 MG PO TABS: 5.0000 mg | ORAL_TABLET | Freq: Every evening | ORAL | Status: DC | PRN

## 2017-07-14 MED ORDER — DIPHENHYDRAMINE HCL 50 MG/ML IJ SOLN
12.5000 mg | Freq: Once | INTRAMUSCULAR | Status: AC
  Administered 2017-07-14: 12.5 mg via INTRAVENOUS

## 2017-07-14 MED ORDER — MAGNESIUM SULFATE 40 G IN LACTATED RINGERS - SIMPLE
2.0000 g/h | INTRAVENOUS | Status: AC
  Administered 2017-07-14: 2 g/h via INTRAVENOUS
  Filled 2017-07-14: qty 40

## 2017-07-14 MED ORDER — SCOPOLAMINE 1 MG/3DAYS TD PT72
MEDICATED_PATCH | TRANSDERMAL | Status: DC | PRN
  Administered 2017-07-14: 1 via TRANSDERMAL

## 2017-07-14 MED ORDER — SENNOSIDES-DOCUSATE SODIUM 8.6-50 MG PO TABS
2.0000 | ORAL_TABLET | ORAL | Status: DC
  Administered 2017-07-15 – 2017-07-17 (×3): 2 via ORAL
  Filled 2017-07-14 (×3): qty 2

## 2017-07-14 MED ORDER — FENTANYL CITRATE (PF) 100 MCG/2ML IJ SOLN
INTRAMUSCULAR | Status: DC | PRN
  Administered 2017-07-14: 50 ug via INTRAVENOUS

## 2017-07-14 MED ORDER — SODIUM BICARBONATE 8.4 % IV SOLN
INTRAVENOUS | Status: DC | PRN
  Administered 2017-07-14: 20 mL via PERINEURAL

## 2017-07-14 MED ORDER — ONDANSETRON HCL 4 MG/2ML IJ SOLN
INTRAMUSCULAR | Status: AC
  Filled 2017-07-14: qty 2

## 2017-07-14 MED ORDER — OXYCODONE HCL 5 MG PO TABS
10.0000 mg | ORAL_TABLET | ORAL | Status: DC | PRN
  Administered 2017-07-15 – 2017-07-16 (×5): 10 mg via ORAL
  Filled 2017-07-14 (×5): qty 2

## 2017-07-14 MED ORDER — MENTHOL 3 MG MT LOZG: 1.0000 | LOZENGE | OROMUCOSAL | Status: DC | PRN

## 2017-07-14 MED ORDER — SIMETHICONE 80 MG PO CHEW: 80.0000 mg | CHEWABLE_TABLET | ORAL | Status: DC | PRN

## 2017-07-14 MED ORDER — PRENATAL MULTIVITAMIN CH
1.0000 | ORAL_TABLET | Freq: Every day | ORAL | Status: DC
  Administered 2017-07-14 – 2017-07-16 (×3): 1 via ORAL
  Filled 2017-07-14 (×3): qty 1

## 2017-07-14 MED ORDER — FENTANYL CITRATE (PF) 100 MCG/2ML IJ SOLN
INTRAMUSCULAR | Status: AC
  Filled 2017-07-14: qty 2

## 2017-07-14 MED ORDER — ONDANSETRON HCL 4 MG/2ML IJ SOLN
INTRAMUSCULAR | Status: DC | PRN
  Administered 2017-07-14: 4 mg via INTRAVENOUS

## 2017-07-14 MED ORDER — OXYCODONE HCL 5 MG PO TABS
5.0000 mg | ORAL_TABLET | ORAL | Status: DC | PRN
  Administered 2017-07-15 – 2017-07-17 (×4): 5 mg via ORAL
  Filled 2017-07-14 (×4): qty 1

## 2017-07-14 MED ORDER — ONDANSETRON HCL 4 MG/2ML IJ SOLN
4.0000 mg | Freq: Four times a day (QID) | INTRAMUSCULAR | Status: DC | PRN
  Administered 2017-07-14: 4 mg via INTRAVENOUS
  Filled 2017-07-14: qty 2

## 2017-07-14 MED ORDER — KETOROLAC TROMETHAMINE 30 MG/ML IJ SOLN
30.0000 mg | Freq: Four times a day (QID) | INTRAMUSCULAR | Status: AC
  Administered 2017-07-14 – 2017-07-15 (×3): 30 mg via INTRAVENOUS
  Filled 2017-07-14 (×3): qty 1

## 2017-07-14 MED ORDER — OXYTOCIN 10 UNIT/ML IJ SOLN
INTRAVENOUS | Status: DC | PRN
  Administered 2017-07-14: 40 [IU] via INTRAVENOUS

## 2017-07-14 MED ORDER — MORPHINE SULFATE (PF) 0.5 MG/ML IJ SOLN
INTRAMUSCULAR | Status: DC | PRN
  Administered 2017-07-14: 3 mg via EPIDURAL

## 2017-07-14 MED ORDER — CEFAZOLIN SODIUM-DEXTROSE 2-3 GM-% IV SOLR
INTRAVENOUS | Status: DC | PRN
  Administered 2017-07-14: 2 g via INTRAVENOUS

## 2017-07-14 MED ORDER — DIBUCAINE 1 % RE OINT: 1.0000 "application " | TOPICAL_OINTMENT | RECTAL | Status: DC | PRN

## 2017-07-14 MED ORDER — CEFAZOLIN SODIUM-DEXTROSE 2-4 GM/100ML-% IV SOLN
INTRAVENOUS | Status: AC
  Filled 2017-07-14: qty 100

## 2017-07-14 SURGICAL SUPPLY — 40 items
BENZOIN TINCTURE PRP APPL 2/3 (GAUZE/BANDAGES/DRESSINGS) ×2 IMPLANT
CHLORAPREP W/TINT 26ML (MISCELLANEOUS) ×2 IMPLANT
CLAMP CORD UMBIL (MISCELLANEOUS) IMPLANT
CLOTH BEACON ORANGE TIMEOUT ST (SAFETY) ×2 IMPLANT
DRAPE C SECTION CLR SCREEN (DRAPES) IMPLANT
DRSG OPSITE POSTOP 4X10 (GAUZE/BANDAGES/DRESSINGS) ×2 IMPLANT
ELECT REM PT RETURN 9FT ADLT (ELECTROSURGICAL) ×2
ELECTRODE REM PT RTRN 9FT ADLT (ELECTROSURGICAL) ×1 IMPLANT
EXTRACTOR VACUUM M CUP 4 TUBE (SUCTIONS) IMPLANT
GLOVE BIO SURGEON STRL SZ7.5 (GLOVE) ×2 IMPLANT
GLOVE BIOGEL PI IND STRL 7.0 (GLOVE) ×1 IMPLANT
GLOVE BIOGEL PI INDICATOR 7.0 (GLOVE) ×1
GOWN STRL REUS W/TWL 2XL LVL3 (GOWN DISPOSABLE) ×2 IMPLANT
GOWN STRL REUS W/TWL LRG LVL3 (GOWN DISPOSABLE) ×4 IMPLANT
KIT ABG SYR 3ML LUER SLIP (SYRINGE) IMPLANT
NEEDLE HYPO 22GX1.5 SAFETY (NEEDLE) ×2 IMPLANT
NEEDLE HYPO 25X5/8 SAFETYGLIDE (NEEDLE) IMPLANT
NS IRRIG 1000ML POUR BTL (IV SOLUTION) ×2 IMPLANT
PACK C SECTION WH (CUSTOM PROCEDURE TRAY) ×2 IMPLANT
PAD OB MATERNITY 4.3X12.25 (PERSONAL CARE ITEMS) ×2 IMPLANT
PENCIL SMOKE EVAC W/HOLSTER (ELECTROSURGICAL) ×2 IMPLANT
RETRACTOR TRAXI PANNICULUS (MISCELLANEOUS) ×1 IMPLANT
RTRCTR C-SECT PINK 25CM LRG (MISCELLANEOUS) ×2 IMPLANT
STRIP CLOSURE SKIN 1/2X4 (GAUZE/BANDAGES/DRESSINGS) ×2 IMPLANT
STRIP CLOSURE SKIN 1X5 (GAUZE/BANDAGES/DRESSINGS) ×2 IMPLANT
SUT CHROMIC 1 CTX 36 (SUTURE) ×14 IMPLANT
SUT VIC AB 1 CT1 27 (SUTURE) ×2
SUT VIC AB 1 CT1 27XBRD ANTBC (SUTURE) ×2 IMPLANT
SUT VIC AB 2-0 CT1 (SUTURE) ×2 IMPLANT
SUT VIC AB 2-0 CT1 27 (SUTURE) ×1
SUT VIC AB 2-0 CT1 TAPERPNT 27 (SUTURE) ×1 IMPLANT
SUT VIC AB 3-0 CT1 27 (SUTURE) ×2
SUT VIC AB 3-0 CT1 TAPERPNT 27 (SUTURE) ×2 IMPLANT
SUT VIC AB 3-0 SH 27 (SUTURE)
SUT VIC AB 3-0 SH 27X BRD (SUTURE) IMPLANT
SUT VIC AB 4-0 KS 27 (SUTURE) ×2 IMPLANT
SYR BULB IRRIGATION 50ML (SYRINGE) IMPLANT
TOWEL OR 17X24 6PK STRL BLUE (TOWEL DISPOSABLE) ×2 IMPLANT
TRAXI PANNICULUS RETRACTOR (MISCELLANEOUS) ×1
TRAY FOLEY BAG SILVER LF 14FR (SET/KITS/TRAYS/PACK) ×2 IMPLANT

## 2017-07-14 NOTE — Anesthesia Postprocedure Evaluation (Signed)
Anesthesia Post Note  Patient: Cynthia [name]  Procedure(s) Performed: Procedure(s) (LRB): CESAREAN SECTION (N/A)     Patient location during evaluation: PACU Anesthesia Type: Epidural Level of consciousness: awake and alert, patient cooperative and oriented Pain management: pain level controlled Vital Signs Assessment: post-procedure vital signs reviewed and stable Respiratory status: spontaneous breathing, nonlabored ventilation and respiratory function stable Cardiovascular status: blood pressure returned to baseline and stable Postop Assessment: patient able to bend at knees, epidural receding and no signs of nausea or vomiting Anesthetic complications: no    Last Vitals:  Vitals:   07/14/17 0715 07/14/17 0800  BP: 136/84 (!) 142/86  Pulse: 86 69  Resp: 14 16  Temp: 36.5 C (!) 36.4 C    Last Pain:  Vitals:   07/14/17 0800  TempSrc: Oral  PainSc:    Pain Goal: Patients Stated Pain Goal: 2 (07/11/17 1942)               Seleta Rhymes. Dashawna Delbridge

## 2017-07-14 NOTE — Progress Notes (Signed)
Placenta was sent to pathology per verbal order  Dr Rip Harbour. Kathi Der twins, GDM, and gestational hypertension. Placenta came off sterile field  with 2 kelly's on both cords so RN could not different baby A's cord.

## 2017-07-14 NOTE — Anesthesia Postprocedure Evaluation (Signed)
Anesthesia Post Note  Patient: Cynthia [name]  Procedure(s) Performed: Procedure(s) (LRB): CESAREAN SECTION (N/A)     Patient location during evaluation: Mother Baby Anesthesia Type: Epidural Level of consciousness: awake and alert and oriented Pain management: pain level controlled Vital Signs Assessment: post-procedure vital signs reviewed and stable Respiratory status: spontaneous breathing and nonlabored ventilation Cardiovascular status: stable Postop Assessment: no headache, no backache, patient able to bend at knees, no signs of nausea or vomiting and adequate PO intake Anesthetic complications: no    Last Vitals:  Vitals:   07/14/17 1012 07/14/17 1225  BP: (!) 155/90 (!) 156/96  Pulse: 82 63  Resp: 16 16  Temp: (!) 36.3 C     Last Pain:  Vitals:   07/14/17 1012  TempSrc: Oral  PainSc:    Pain Goal: Patients Stated Pain Goal: 2 (07/11/17 1942)               Willa Rough

## 2017-07-14 NOTE — Progress Notes (Signed)
Lasix 20 mg IV administered as ordered.

## 2017-07-14 NOTE — Addendum Note (Signed)
Addendum  created 07/14/17 1436 by Jonna Munro, CRNA   Sign clinical note

## 2017-07-14 NOTE — Transfer of Care (Signed)
Immediate Anesthesia Transfer of Care Note  Patient: Cynthia [name]  Procedure(s) Performed: Procedure(s): CESAREAN SECTION (N/A)  Patient Location: PACU  Anesthesia Type:Epidural  Level of Consciousness: awake, alert  and oriented  Airway & Oxygen Therapy: Patient Spontanous Breathing  Post-op Assessment: Report given to RN and Post -op Vital signs reviewed and stable  Post vital signs: Reviewed and stable  Last Vitals:  Vitals:   07/14/17 0131 07/14/17 0201  BP: (!) 148/88 (!) 134/93  Pulse: 79 88  Resp:  20  Temp:  36.6 C    Last Pain:  Vitals:   07/14/17 0201  TempSrc: Oral  PainSc: 0-No pain      Patients Stated Pain Goal: 2 (97/58/83 2549)  Complications: No apparent anesthesia complications

## 2017-07-14 NOTE — Progress Notes (Signed)
Initiated double electric breast pump. Instructed patient how to use pump and how to care for pump kit parts.

## 2017-07-14 NOTE — Op Note (Signed)
Cesarean Section Operative Report  Leler N Gallion  PROCEDURE DATE: 07/14/2017  PREOPERATIVE DIAGNOSES: Intrauterine pregnancy at [redacted]w[redacted]d weeks gestation; Twin gestation; arrest of dilation  POSTOPERATIVE DIAGNOSES: The same  PROCEDURE: Primary Low Transverse Cesarean Section  SURGEON:   Surgeon(s) and Role:    * Jelitza Manninen, Audrie Lia, MD - Primary - Attending   * Crissie Figures, MD - OB Fellow  ASSISTANT:  Crissie Figures, MD - OB Fellow   INDICATIONS: Cynthia [name] is a 36 y.o. G2P0010 at [redacted]w[redacted]d here for cesarean section secondary to the indications listed under preoperative diagnoses; please see preoperative note for further details.  The risks of cesarean section were discussed with the patient including but were not limited to: bleeding which may require transfusion or reoperation; infection which may require antibiotics; injury to bowel, bladder, ureters or other surrounding organs; injury to the fetus; need for additional procedures including hysterectomy in the event of a life-threatening hemorrhage; placental abnormalities wth subsequent pregnancies, incisional problems, thromboembolic phenomenon and other postoperative/anesthesia complications.   The patient concurred with the proposed plan, giving informed written consent for the procedure.    FINDINGS:  Viable female infant A in cephalic presentation.  Apgars 7 and 9.  Clear amniotic fluid.  Intact placenta, three vessel cord.  Viable infant B in breech presentation; Clear amniotic fluid; Apgars 4 and 8. Normal uterus.  ANESTHESIA: Epidural INTRAVENOUS FLUIDS: 2450 ml ESTIMATED BLOOD LOSS: 1100 ml URINE OUTPUT:  100 ml SPECIMENS: Placenta sent to L&D COMPLICATIONS: vacuum-assisted delivery of infant A  PROCEDURE IN DETAIL:  The patient preoperatively received intravenous antibiotics and had sequential compression devices applied to her lower extremities.  She was then taken to the operating room where the epidural anesthesia was dosed up  to surgical level and was found to be adequate. She was then placed in a dorsal supine position with a leftward tilt, and prepped and draped in a sterile manner.  A foley catheter was placed into her bladder and attached to constant gravity.    After an adequate timeout was performed, a Pfannenstiel skin incision was made with scalpel and carried through to the underlying layer of fascia. The fascia was incised in the midline, and this incision was extended bilaterally using the Mayo scissors.  Kocher clamps were applied to the superior aspect of the fascial incision and the underlying rectus muscles were dissected off bluntly.  A similar process was carried out on the inferior aspect of the fascial incision. The rectus muscles were separated in the midline bluntly and the peritoneum was entered bluntly. Attention was turned to the lower uterine segment where a low transverse hysterotomy was made with a scalpel and extended bilaterally bluntly.  Infant A was difficult to deliver, forceps were attempted, but unsuccessful, d/t rotation of the infants head. Was able to elevate infants head to allow vacuum-assisted delivery without problems. Infants body was deliveries without problemsThe cord was clamped and cut and the infant was handed over to the awaiting neonatology team. AROM of infant B. Infant noted to be in breech presentation. Both infants feet were grasped and infant was delivered to its shoulders. Each arm was then reduced and delivered without problems. Infants head was delivered without probelsm with assistance of fundal pressure. The cord was clamped and cut immediately and handed over to awaiting neonatology team. Cord blood was drawn for both cords. Uterine massage was then administered, and the placenta delivered intact. The uterus was then cleared of clots and debris.  The hysterotomy was  closed with Chromic in a running locked fashion, and an imbricating layer was also placed.  Figure-of-eight  Chromic serosal stitches were placed to help with hemostasis. Hemostasis was confirmed on all surfaces.  The peritoneum was closed and rectus muscles approximated with a Chromic running stitch. The fascia was then closed using 0 Vicryl in a running fashion.  The subcutaneous layer was irrigated, then reapproximated interrupted stitches.  The skin was closed with a 4-0 Vicryl subcuticular stitch.   The patient tolerated the procedure well. Sponge, lap, instrument and needle counts were correct x 3.  She was taken to the recovery room in stable condition.     Disposition: PACU - hemodynamically stable.   Maternal Condition: stable    Signed: Gailen Shelter, MD OB Fellow 07/14/2017 4:09 AM  I was present and scrubbed in for the entire procedure. Arlina Robes. MD  OB/GYN Faculty

## 2017-07-15 LAB — GLUCOSE, CAPILLARY: GLUCOSE-CAPILLARY: 117 mg/dL — AB (ref 65–99)

## 2017-07-15 MED ORDER — ENALAPRIL MALEATE 10 MG PO TABS
10.0000 mg | ORAL_TABLET | Freq: Every day | ORAL | Status: DC
  Administered 2017-07-15 – 2017-07-17 (×3): 10 mg via ORAL
  Filled 2017-07-15 (×4): qty 1

## 2017-07-15 NOTE — Progress Notes (Signed)
Patient reported to RN that she is allergic to her SCD's and her husband remove them. We informed MD and we received an order for Ted stockings. Both patient and spouse were informed of same and they both agreed on the Plan of Care

## 2017-07-15 NOTE — Lactation Note (Signed)
This note was copied from a baby's chart. Lactation Consultation Note  Patient Name: Cynthia [name] ENIDP'O Date: 07/15/2017   NICU twins 57 hours old. Mother sleeping, FOBs reports that mom is pumping and getting a few drops.  Maternal Data    Feeding Feeding Type: Donor Breast Milk Nipple Type: Slow - flow Length of feed: 10 min  LATCH Score                   Interventions    Lactation Tools Discussed/Used     Consult Status      Andres Labrum 07/15/2017, 1:29 PM

## 2017-07-15 NOTE — Progress Notes (Signed)
Post Partum Day 1 LTCS twins/PEC Subjective: Pt sitting in bed breast pumping. Feels much since off magnesium. No HA still a little blurry vision. Tolerating diet, voiding, + flatus, good pain control.    Objective: Blood pressure (!) 144/83, pulse 83, temperature 97.7 F (36.5 C), temperature source Oral, resp. rate 20, height 5\' 2"  (1.575 m), weight 109.3 kg (241 lb), last menstrual period 02/12/2016, SpO2 100 %.  Physical Exam:  General: no distress Lochia: appropriate Uterine Fundus: firm Incision: healing well DVT Evaluation: No evidence of DVT seen on physical exam, + 2 edema   Recent Labs  07/13/17 0726 07/14/17 0503  HGB 11.8* 9.5*  HCT 35.3* 27.6*    Assessment/Plan: POD # 1 LTCS PEC GDM  Stable. Of magnesium. Will stable Vasotec for better BP control and reduce LE edema. Continue with progressive care   LOS: 3 days   Chancy Milroy 07/15/2017, 11:33 AM

## 2017-07-15 NOTE — Progress Notes (Signed)
   07/14/17 2321  Vital Signs  BP (!) 148/100  BP Location Left Arm  Patient Position (if appropriate) Semi-fowlers  BP Method Automatic  Pulse Rate 78  Pulse Rate Source Monitor  Resp 16  Oxygen Therapy  SpO2 100 %  O2 Device Room Air  Pt c/o rt arm weakness, pulse strong, arm warm to touch, denies numbness, patient is able to squeeze my hand, lift her hand above the head, no drifting noted.GCS 15, MD called and made aware. No new orders received. Spouse at bedside and he is fully involved in her care. We will continue to monitor.

## 2017-07-15 NOTE — Lactation Note (Signed)
This note was copied from a baby's chart. Lactation Consultation Note  Patient Name: Cynthia [name] [id number] Date: 07/15/2017 Reason for consult: Follow-up assessment;NICU baby;Multiple gestation  NICU twins 29 hours old. Mom has just finished pumping and FOBs is taking EBM to NICU. Reviewed hand expression with mom with no colostrum flowing at this time. Discussed progression of milk coming to volume and enc pumping every 2-3 hours for a total of 8-12 times/24 hours followed by hand expression. Mom has Spectra DEBP. Discussed benefits of hospital-grade pump, and enc mom to compare Spectra to [employer] mom is using in hospital. Mom aware of pump rental and pumping rooms in NICU. Enc mom to have pictures of the babies on her phone to look at while pumping, and Enc offering STS and nuzzling/latching as she and babies are able. Mom aware of OP/BFSG and [place] phone line assistance after D/C.    Maternal Data Has patient been taught Hand Expression?: Yes Does the patient have breastfeeding experience prior to this delivery?: No  Feeding Feeding Type: Donor Breast Milk Nipple Type: Slow - flow Length of feed: 10 min  LATCH Score                   Interventions Interventions: Hand express  Lactation Tools Discussed/Used Tools: Pump Breast pump type: Double-Electric Breast Pump Pump Review: Setup, frequency, and cleaning;Milk Storage Initiated by:: bedside RN. Date initiated:: 07/14/17   Consult Status Consult Status: Follow-up Date: 07/16/17 Follow-up type: In-patient    Andres Labrum 07/15/2017, 2:39 PM

## 2017-07-15 NOTE — Plan of Care (Signed)
Problem: Bowel/Gastric: Goal: Gastrointestinal status will improve Outcome: Progressing States now passing flatus.

## 2017-07-16 MED ORDER — ONDANSETRON HCL 4 MG PO TABS
4.0000 mg | ORAL_TABLET | Freq: Three times a day (TID) | ORAL | Status: DC | PRN
  Administered 2017-07-16 (×2): 4 mg via ORAL
  Filled 2017-07-16 (×3): qty 1

## 2017-07-16 NOTE — Addendum Note (Signed)
Addendum  created 07/16/17 1809 by Suzette Battiest, MD   Anesthesia Staff edited

## 2017-07-16 NOTE — Progress Notes (Signed)
Patient ID: Cynthia [name], female   DOB: [birth year], 36 y.o.   MRN: [id number] Have tried to see pt several times today. Either in shower or in NICU Doing well per nursing BP stable Continue with current management

## 2017-07-16 NOTE — Progress Notes (Signed)
CSW acknowledges NICU admission.    Patient screened out for psychosocial assessment since none of the following apply:  Psychosocial stressors documented in mother or baby's chart  Gestation less than 32 weeks  Code at delivery   Infant with anomalies  CSW notes hx of Anx/Dep noted in MOB's chart.  Note from 2023/09/10 states onset after the death of her father.  Please contact the Clinical Social Worker if specific needs arise, by MOB's request, or if MOB scores 9+ or yes to question 10 on the Edinburgh Postpartum Depression Screen.

## 2017-07-16 NOTE — Lactation Note (Signed)
This note was copied from a baby's chart. Lactation Consultation Note  Patient Name: Cynthia [name] [id number] Date: 07/16/2017 Reason for consult: Follow-up assessment;NICU baby;Multiple gestation  NICU twins 41 hours old. Assisted mom with latching [name] to mom's left breast in football position. Reviewed positioning with mom and enc mom to offer EBM just prior to latching. Baby able to latch deeply and suckle rhythmically with lips flanged and a few swallows noted. Mom reports that both baby's are willing to latch and she is trying to latch both at least twice in 24 hours. Mom reports feeling sleepy while nursing, so discussed cause and enc always having someone ([name] today) watching mom while she is nursing if she feels sleepy.   Mom reports that she is continuing to pump and is seeing an increase in EBM volume. Discussed the benefits of STS and latching to breast milk supply.  Mom  Maternal Data    Feeding Feeding Type: Breast Fed Nipple Type: Slow - flow Length of feed:  (LC assessed first 10 minutes of BF. )  LATCH Score Latch: Grasps breast easily, tongue down, lips flanged, rhythmical sucking.  Audible Swallowing: A few with stimulation  Type of Nipple: Everted at rest and after stimulation  Comfort (Breast/Nipple): Soft / non-tender  Hold (Positioning): Assistance needed to correctly position infant at breast and maintain latch.  LATCH Score: 8  Interventions Interventions: Breast feeding basics reviewed;Assisted with latch;Hand express;Adjust position;Breast compression;Support pillows;Position options  Lactation Tools Discussed/Used     Consult Status Consult Status: Follow-up Date: 07/17/17 Follow-up type: In-patient    Andres Labrum 07/16/2017, 5:46 PM

## 2017-07-17 ENCOUNTER — Encounter: Payer: Self-pay | Admitting: Obstetrics & Gynecology

## 2017-07-17 DIAGNOSIS — O1413 Severe pre-eclampsia, third trimester: Secondary | ICD-10-CM

## 2017-07-17 MED ORDER — OXYCODONE HCL 10 MG PO TABS: 10.0000 mg | ORAL_TABLET | ORAL | 0 refills | Status: DC | PRN

## 2017-07-17 MED ORDER — ENALAPRIL MALEATE 10 MG PO TABS: 10.0000 mg | ORAL_TABLET | Freq: Every day | ORAL | 1 refills | Status: DC

## 2017-07-17 MED ORDER — IBUPROFEN 600 MG PO TABS: 600.0000 mg | ORAL_TABLET | Freq: Four times a day (QID) | ORAL | 0 refills | Status: DC

## 2017-07-17 MED ORDER — BISACODYL 10 MG RE SUPP
10.0000 mg | RECTAL | Status: DC | PRN
  Filled 2017-07-17: qty 1

## 2017-07-17 MED FILL — oxyCODONE HCL 10 MG TABS: 10 | 5 days supply | Qty: 30 | Fill #0

## 2017-07-17 MED FILL — IBUPROFEN 600 MG TABLET: 600 | 8 days supply | Qty: 30 | Fill #0

## 2017-07-17 MED FILL — ENALAPRIL MALEATE 10 MG TAB: 10 | 30 days supply | Qty: 30 | Fill #0

## 2017-07-17 NOTE — Lactation Note (Signed)
This note was copied from a baby's chart. Lactation Consultation Note  Patient Name: Cynthia [name] Date: 07/17/2017 Reason for consult: Follow-up assessment;NICU baby;Multiple gestation  NICU twins 49 hours old. Mom given 2-week DEBP rental, and mom enc to take entire pumping kit with her at D/C. Mom aware of pumping rooms in NICU. Discussed engorgement prevention/treatment, and mom aware of [place] phone line assistance after D/C.   Maternal Data    Feeding Feeding Type: Donor Breast Milk Length of feed: 30 min  LATCH Score                   Interventions    Lactation Tools Discussed/Used Tools: Pump Breast pump type: Double-Electric Breast Pump;Other (comment) (2-week loaner.)   Consult Status Consult Status: PRN    Andres Labrum 07/17/2017, 10:59 AM

## 2017-07-17 NOTE — Discharge Summary (Signed)
Obstetric Discharge Summary Reason for Admission: Gestational Hypertension, twin gestation Prenatal Procedures: ultrasound Intrapartum Procedures: cesarean: low cervical, transverse Postpartum Procedures: Magnesium therapy Complications-Operative and Postpartum: none Hemoglobin  Date Value Ref Range Status  07/14/2017 9.5 (L) 12.0 - 15.0 g/dL Final   HCT  Date Value Ref Range Status  07/14/2017 27.6 (L) 36.0 - 46.0 % Final   Hospital course: Pt was admitted with Di/Di twins with GHTN, progressed to [place]. Magnesium therapy started. IOL ensued. Arrested of descent and dilatation. LTCS completed. See Op note for additional information. Post op course was unremarkable. Received magnesium x 24 hours. Started on BP meds for BP control. BP's 150's/80's at time of discharge. Progressed to ambulating, voiding, + flatus, tolerating diet, breast pumping and good oral pain control. Amendable for discharge home on POD # 3.   Physical Exam:  General: no distress Lochia: appropriate Uterine Fundus: firm Incision: healing well DVT Evaluation: No evidence of DVT seen on physical exam.  Discharge Diagnoses: LTCS at 35 weeks secondary to arrest of descent and PEC  Discharge Information: Date: 07/17/2017 Activity: pelvic rest Diet: routine Medications: PNV, Ibuprofen, Percocet and Vasotec Condition: stable Instructions: refer to practice specific booklet Discharge to: [facility] [employer] [facility] at [place]. Schedule an appointment as soon as possible for a visit in 1 week(s).   Specialty:  Obstetrics and Gynecology Why:  Make appt for 1 week for BP and wound check Make appt for 4 weeks for Postpartum visit Contact information: [place], [place] [phone]          Newborn Data:   Neva, Ramaswamy [[id number]]  Live born female  Birth Weight: 4 lb 13.6 oz (2200 g) APGAR: 6, 9   Jeany, Seville  [[id number]]  Live born female  Birth Weight: 5 lb 1.1 oz (2300 g) APGAR: 4, 8  Stable in NICU  Chancy Milroy 07/17/2017, 9:01 AM

## 2017-07-18 ENCOUNTER — Ambulatory Visit: Payer: Self-pay

## 2017-07-18 NOTE — Lactation Note (Signed)
This note was copied from a baby's chart. Lactation Consultation Note  Patient Name: Cynthia [name] Date: 07/18/2017    NICU twins 19 days old. Mom requesting information regarding galactagogues--given handouts for [place], Fenugreek and recipe for Lactation cookies with steel-cut oats. Enc mom to discuss with provider. Discussed with mom that using DEBP every 2-3 hours followed by hand expression and offering STS and latching with babies is primary, reviewed a pumping routine and highlighted the importance of the first 2 weeks postpartum to establishing a milk supply.    Maternal Data    Feeding Feeding Type: Donor Breast Milk Length of feed: 30 min  LATCH Score Latch: Grasps breast easily, tongue down, lips flanged, rhythmical sucking.  Audible Swallowing: Spontaneous and intermittent  Type of Nipple: Everted at rest and after stimulation  Comfort (Breast/Nipple): Soft / non-tender  Hold (Positioning): No assistance needed to correctly position infant at breast.  LATCH Score: 10  Interventions Interventions: Breast feeding basics reviewed;Support pillows  Lactation Tools Discussed/Used     Consult Status      Andres Labrum 07/18/2017, 5:08 PM

## 2017-07-18 NOTE — Lactation Note (Signed)
This note was copied from a baby's chart. Lactation Consultation Note  Patient Name: Cynthia [name] Today's Date: 07/18/2017   NICU twins 4 days old. Mom requested a second pumping kit to leave at hospital and it was given. Showed mom the pumping rooms in NICU. Mom had questions about pumping one breast at a time. Discussed benefits of pumping both breasts simultaneously--especially for twins--and how to make a hands-free bra for hands on pumping.   Maternal Data    Feeding Feeding Type: Donor Breast Milk Nipple Type: Dr. Browns Ultra Preemie Length of feed: 45 min  LATCH Score                   Interventions    Lactation Tools Discussed/Used     Consult Status      Jennifer D Williard 07/18/2017, 1:07 PM    

## 2017-07-19 ENCOUNTER — Ambulatory Visit (HOSPITAL_COMMUNITY): Payer: 59

## 2017-07-22 ENCOUNTER — Ambulatory Visit: Payer: Self-pay

## 2017-07-22 NOTE — Lactation Note (Signed)
This note was copied from a baby's chart. Lactation Consultation Note  Patient Name: Cynthia [name] [id number] Date: 07/22/2017 Reason for consult: Follow-up assessment;Late-preterm 34-36.6wks;Multiple gestation  Consult with this mom of twins, now 20 days old and 8 5/7 weeks CGA. Mom has a good milk supply, and needed help with latching Cynthia [name]. I had mom use the cross cradle hold, and Cynthia [name] latched easily, with flanged lips. She slept through the feeding, but did have intermittent suckles and some visible swallows. LIP information reviewed with parents, and pumping frequency and duartion reviewed. I will see mom and her babies again tomorrow, 8/14.    Maternal Data    Feeding Feeding Type: Breast Milk Length of feed:  (on and off sucking for 15 minutes)  LATCH Score Latch: Repeated attempts needed to sustain latch, nipple held in mouth throughout feeding, stimulation needed to elicit sucking reflex.  Audible Swallowing: None  Type of Nipple: Everted at rest and after stimulation  Comfort (Breast/Nipple): Soft / non-tender  Hold (Positioning): Assistance needed to correctly position infant at breast and maintain latch.  LATCH Score: 6  Interventions Interventions:  (lactation called to bedside for assistance )  Lactation Tools Discussed/Used     Consult Status Consult Status: Follow-up Date: 07/23/17 Follow-up type: In-patient (NICU)    Cynthia [name] 07/22/2017, 12:17 PM

## 2017-07-22 NOTE — Lactation Note (Signed)
This note was copied from a baby's chart. Lactation Consultation Note  Patient Name: Cynthia [name] [id number] Date: 07/22/2017 Reason for consult: Follow-up assessment;Late-preterm 34-36.6wks;Multiple gestation     Consult with this mom of 27 4/7 weeks twins, now 89 days old, and 9 5/7 weeks CGa. Mom has not been able to get [name] latched. I assisted her with football hold, baby sin to skin, used an asymmetrical latch. She latched easily, wide flanged lips, good suckles, with breast movement, and visible intermittent swallows. Mom thrilled. A lot of LPI information reviewed with parents, and mom encouraged to pump 15-30 minutes, now that her milk is in, and pump until she stops dripping. She had still been limiting pumping to 15 minutes. I also told her to try and limit time without pumping to 4 hours at night, and still have a goal of at least 8 times a day. Mom is pumping 60-90 ml's at a time now, which is god. I explained she may make as much as 1 1/2 liters of milk a day., by 14 days pp.  Mom was told to eat when hungry, and stay hydrated - to not allow herself to get thirsty.  I gave mom o/p lactation information, including the [facility] clinic phone number to call for an appointment.  I will follow up with this mom in NICU tomorrow.    Maternal Data    Feeding Feeding Type: Breast Fed Nipple Type: Dr. Myra Gianotti Preemie Length of feed:  (on and off sucking for 15 mintes, asleep at the breast whole feed)  LATCH Score Latch: Grasps breast easily, tongue down, lips flanged, rhythmical sucking.  Audible Swallowing: A few with stimulation  Type of Nipple: Everted at rest and after stimulation  Comfort (Breast/Nipple): Soft / non-tender  Hold (Positioning): Assistance needed to correctly position infant at breast and maintain latch.  LATCH Score: 8  Interventions Interventions: Breast feeding basics reviewed;Assisted with latch;Skin to skin;Breast massage;Hand express;Adjust  position;Support pillows;Position options;Expressed milk;DEBP  Lactation Tools Discussed/Used     Consult Status Consult Status: Follow-up Date: 07/23/17 Follow-up type: In-patient (NICU)    Tonna Corner 07/22/2017, 11:57 AM

## 2017-07-23 ENCOUNTER — Encounter: Payer: Self-pay | Admitting: Obstetrics & Gynecology

## 2017-07-23 ENCOUNTER — Ambulatory Visit: Payer: Self-pay

## 2017-07-23 ENCOUNTER — Ambulatory Visit: Payer: 59 | Admitting: Obstetrics & Gynecology

## 2017-07-23 VITALS — BP 127/83 | HR 104 | Wt 199.0 lb

## 2017-07-23 DIAGNOSIS — Z9889 Other specified postprocedural states: Secondary | ICD-10-CM

## 2017-07-23 NOTE — Progress Notes (Signed)
   Subjective:    Patient ID: Cynthia [name], female    DOB: 03-27-1981, 36 y.o.   MRN: [id number]  HPI 36 yo M A P2 here a week after a PLTC for arrest of descent. She is here for a BP check and wound check. She has no complaints. Babies will probably come home from NICU tomorrow. She is pumping and breastfeeding.   Review of Systems     Objective:   Physical Exam Well nourished, well hydrated Asianfemale, no apparent distress Breathing, conversing, and ambulating normally She has lost 41 pounds since delivery. Incision healed well. Small area of granulation tissue at the left edge, treated with silver nitrate.      Assessment & Plan:  Post op- doing well RTC 1 week for BP check, may need to decrease/stop her BP meds then.

## 2017-07-23 NOTE — Lactation Note (Signed)
This note was copied from a baby's chart. Lactation Consultation Note  Patient Name: Cynthia [name] MOLMB'[id number] Date: 07/23/2017 Reason for consult: Mother's request;NICU baby;Late-preterm 34-36.6wks  Mom wanted Lactation to see "Cynthia [name]" latch. Upon arrival, Cynthia [name] was already latched & nursing quite well for an LPI. Towards the end of the feeding, the infant's suckling slowed. Mom's nipple was nicely rounded when infant released latch. During feeding, Mom was encouraged to allow infant's cheeks to touch breast & to do breast compression when infant is not actively swallowing. Mom understands that feedings at the breast should not be lengthy, especially in light of needing to supplement with a bottle after feedings (30 minute feedings was given as a guideline for breastfeeding plus giving a bottle). RN stated that infant was moved from a Dr. Jarrett Soho Preemie to a Preemie nipple today, which will hopefully decrease the amount of time needed that infant uses to take a bottle.   Parents anticipate discharge tomorrow. They were provided phone # for making an outpatient appt.   Matthias Hughs [facility] 07/23/2017, 4:55 PM

## 2017-07-24 ENCOUNTER — Inpatient Hospital Stay (HOSPITAL_COMMUNITY): Admission: RE | Admit: 2017-07-24 | Payer: 59 | Source: Ambulatory Visit

## 2017-07-31 ENCOUNTER — Encounter: Payer: Self-pay | Admitting: *Deleted

## 2017-07-31 ENCOUNTER — Ambulatory Visit: Payer: 59 | Admitting: *Deleted

## 2017-07-31 VITALS — BP 120/79 | HR 118

## 2017-07-31 DIAGNOSIS — O10919 Unspecified pre-existing hypertension complicating pregnancy, unspecified trimester: Secondary | ICD-10-CM

## 2017-07-31 MED ORDER — ENALAPRIL MALEATE 10 MG PO TABS: 10.0000 mg | ORAL_TABLET | Freq: Every day | ORAL | 1 refills | Status: DC

## 2017-07-31 NOTE — Progress Notes (Signed)
Pt here for BP reading.  Today BP is 120/79 and P-118.  Spoke with Dr Gala Romney who decreased the Vasotec to 5 mg daily and do home BP readings for 1 week and call with the readings.  Pt aware of plan.

## 2017-10-20 ENCOUNTER — Ambulatory Visit (HOSPITAL_COMMUNITY)
Admission: EM | Admit: 2017-10-20 | Discharge: 2017-10-20 | Disposition: A | Discharge status: Home / Self Care | Payer: 59 | Attending: Family Medicine | Admitting: Family Medicine

## 2017-10-20 ENCOUNTER — Encounter (HOSPITAL_COMMUNITY): Payer: Self-pay | Admitting: Emergency Medicine

## 2017-10-20 DIAGNOSIS — F329 Major depressive disorder, single episode, unspecified: Secondary | ICD-10-CM | POA: Insufficient documentation

## 2017-10-20 DIAGNOSIS — Z8632 Personal history of gestational diabetes: Secondary | ICD-10-CM | POA: Insufficient documentation

## 2017-10-20 DIAGNOSIS — Z87891 Personal history of nicotine dependence: Secondary | ICD-10-CM | POA: Insufficient documentation

## 2017-10-20 DIAGNOSIS — Z3202 Encounter for pregnancy test, result negative: Secondary | ICD-10-CM | POA: Diagnosis not present

## 2017-10-20 DIAGNOSIS — N83209 Unspecified ovarian cyst, unspecified side: Secondary | ICD-10-CM | POA: Diagnosis not present

## 2017-10-20 DIAGNOSIS — F419 Anxiety disorder, unspecified: Secondary | ICD-10-CM | POA: Insufficient documentation

## 2017-10-20 DIAGNOSIS — N921 Excessive and frequent menstruation with irregular cycle: Secondary | ICD-10-CM | POA: Insufficient documentation

## 2017-10-20 DIAGNOSIS — R300 Dysuria: Secondary | ICD-10-CM | POA: Insufficient documentation

## 2017-10-20 DIAGNOSIS — N390 Urinary tract infection, site not specified: Secondary | ICD-10-CM | POA: Diagnosis present

## 2017-10-20 DIAGNOSIS — N736 Female pelvic peritoneal adhesions (postinfective): Secondary | ICD-10-CM | POA: Diagnosis not present

## 2017-10-20 DIAGNOSIS — E785 Hyperlipidemia, unspecified: Secondary | ICD-10-CM | POA: Diagnosis not present

## 2017-10-20 LAB — POCT URINALYSIS DIP (DEVICE)
Bilirubin Urine: NEGATIVE
Glucose, UA: NEGATIVE mg/dL
HGB URINE DIPSTICK: NEGATIVE
Ketones, ur: NEGATIVE mg/dL
Leukocytes, UA: NEGATIVE
NITRITE: NEGATIVE
PH: 5.5 (ref 5.0–8.0)
PROTEIN: NEGATIVE mg/dL
Specific Gravity, Urine: 1.025 (ref 1.005–1.030)
UROBILINOGEN UA: 0.2 mg/dL (ref 0.0–1.0)

## 2017-10-20 LAB — POCT PREGNANCY, URINE: Preg Test, Ur: NEGATIVE

## 2017-10-20 NOTE — ED Provider Notes (Signed)
[facility]    CSN: [id number] Arrival date & time: 10/20/17  1203     History   Chief Complaint Chief Complaint  Patient presents with  . Urinary Tract Infection    HPI Cynthia [name] is a 36 y.o. female.   36 year old female presents with burning with urination for the past 3 days. Also noticed slight increase in frequency but no hematuria, pelvic,abdominal or unusual back pain. Also denies any nausea, vomiting, diarrhea or unusual vaginal discharge. She is 3 months post-partum and currently breast-feeding twin girls. She is no longer on any medications.    The history is provided by the patient.    Past Medical History:  Diagnosis Date  . Anxiety   . Depression   . Endometrioma of ovary 06/17/2015  . Endometriosis   . Gestational diabetes   . Gestational hypertension w/o significant proteinuria in 2nd trimester 05/28/2017   BPPs until 32 weeks Weekly labs, serial growth [place], home BP monitoring.  . H/O seasonal allergies   . History of anemia   . History of ovarian cyst    dermoid  . Hyperlipidemia   . Wears glasses     Patient Active Problem List   Diagnosis Date Noted  . Severe preeclampsia, third trimester 06/13/2017  . Twin gestation, dichorionic diamniotic 05/07/2017  . Gestational diabetes 01/23/2017  . GBS bacteriuria 01/19/2017  . Supervision of high risk primigravida in second trimester in patient 35 years or older at time of delivery 01/16/2017  . Pelvic adhesive disease 06/17/2015    Class: Present on Admission  . Menometrorrhagia 12/20/2014  . Anemia 12/20/2014  . Hyperlipidemia 11/22/2014  . Severe obesity (BMI >= 40) ([place]) 11/22/2014  . Anxiety state 11/22/2014    Past Surgical History:  Procedure Laterality Date  . ivf    . LAPAROSCOPIC OVARIAN CYSTECTOMY  2014, 2016  . LAPAROSCOPY OVARIAN CYSTECTOMY  2013   dermoid  . OVARIAN CYST REMOVAL      OB History    Gravida Para Term Preterm AB Living   2 1   1 1 2    SAB TAB  Ectopic Multiple Live Births   1     1 2        Home Medications    Prior to Admission medications   Not on File    Family History Family History  Problem Relation Age of Onset  . Cancer Mother        breast  . Diabetes Mother        and mother's side of family  . Hyperlipidemia Mother   . Hypertension Mother   . Cancer Father        thyroid  . Hyperlipidemia Maternal Grandfather   . Hypertension Maternal Grandfather   . Diabetes Maternal Grandfather   . Heart disease Maternal Uncle     Social History Social History   Tobacco Use  . Smoking status: Former Smoker    Packs/day: 1.00    Years: 10.00    Pack years: 10.00    Types: Cigarettes    Last attempt to quit: 11/23/2011    Years since quitting: 5.9  . Smokeless tobacco: Never Used  Substance Use Topics  . Alcohol use: Yes    Comment: rare  . Drug use: No     Allergies   Patient has no known allergies.   Review of Systems Review of Systems  Constitutional: Negative for activity change, appetite change, chills, fatigue and fever.  Respiratory: Negative  for cough, chest tightness, shortness of breath and wheezing.   Cardiovascular: Negative for chest pain and leg swelling.  Gastrointestinal: Negative for abdominal pain, diarrhea, nausea and vomiting.  Genitourinary: Positive for dysuria and frequency. Negative for decreased urine volume, difficulty urinating, flank pain, hematuria, pelvic pain, urgency, vaginal bleeding, vaginal discharge and vaginal pain.  Musculoskeletal: Negative for arthralgias and myalgias.  Skin: Negative for rash and wound.  Neurological: Negative for dizziness, tremors, seizures, syncope, weakness, light-headedness, numbness and headaches.  Hematological: Negative for adenopathy. Does not bruise/bleed easily.     Physical Exam Triage Vital Signs ED Triage Vitals  Enc Vitals Group     BP 10/20/17 1235 134/86     Pulse Rate 10/20/17 1235 86     Resp 10/20/17 1235 18      Temp 10/20/17 1235 98 F (36.7 C)     Temp Source 10/20/17 1235 Oral     SpO2 10/20/17 1235 100 %     Weight --      Height --      Head Circumference --      Peak Flow --      Pain Score 10/20/17 1236 0     Pain Loc --      Pain Edu? --      Excl. in [place]? --    No data found.  Updated Vital Signs BP 134/86 (BP Location: Left Arm)   Pulse 86   Temp 98 F (36.7 C) (Oral)   Resp 18   LMP 02/12/2016   SpO2 100%   Breastfeeding? Unknown   Visual Acuity Right Eye Distance:   Left Eye Distance:   Bilateral Distance:    Right Eye Near:   Left Eye Near:    Bilateral Near:     Physical Exam  Constitutional: She is oriented to person, place, and time. She appears well-developed and well-nourished. No distress.  HENT:  Head: Normocephalic and atraumatic.  Eyes: Conjunctivae and EOM are normal.  Neck: Normal range of motion.  Cardiovascular: Normal rate, regular rhythm and normal heart sounds.  No murmur heard. Pulmonary/Chest: Effort normal and breath sounds normal. No stridor. No respiratory distress. She has no decreased breath sounds. She has no wheezes. She has no rhonchi.  Abdominal: Soft. Normal appearance and bowel sounds are normal. She exhibits no distension and no mass. There is no tenderness. There is no rigidity, no rebound, no guarding and no CVA tenderness.  Musculoskeletal: Normal range of motion.  Neurological: She is alert and oriented to person, place, and time.  Skin: Skin is warm and dry. No rash noted.  Psychiatric: She has a normal mood and affect. Her behavior is normal. Judgment and thought content normal.     UC Treatments / Results  Labs (all labs ordered are listed, but only abnormal results are displayed) Labs Reviewed  URINE CULTURE  POCT URINALYSIS DIP (DEVICE)  POCT PREGNANCY, URINE    EKG  EKG Interpretation None       Radiology No results found.  Procedures Procedures (including critical care time)  Medications Ordered in  UC Medications - No data to display   Initial Impression / Assessment and Plan / UC Course  I have reviewed the triage vital signs and the nursing notes.  Pertinent labs & imaging results that were available during my care of the patient were reviewed by me and considered in my medical decision making (see chart for details).    Reviewed urinalysis results with patient which were all  normal. No distinct UTI.  Discussed that various other etiologies can cause UTI symptoms. Patient just wanted to make sure she did not have a bacterial infection and need antibiotics. Urine sent for culture. Recommend increase fluid intake, including Cranberry juice, to help with symptoms. Follow-up pending urine culture results. If symptoms do not improve within 3 to 4 days, follow-up with her OB or PCP for further evaluation.   Final Clinical Impressions(s) / UC Diagnoses   Final diagnoses:  Dysuria    ED Discharge Orders    None       Controlled Substance Prescriptions [place] Controlled Substance Registry consulted? Not Applicable   Katy Apo, NP 10/21/17 [phone]

## 2017-10-20 NOTE — Discharge Instructions (Addendum)
Recommend increase fluid intake, such as Cranberry juice to help with symptoms. Follow-up pending urine culture results. If symptoms do not improve within 3 to 4 days, follow-up with your OB or your PCP for further evaluation.

## 2017-10-20 NOTE — ED Triage Notes (Signed)
Pt here with dysuria x 3 days

## 2017-10-21 ENCOUNTER — Encounter (HOSPITAL_COMMUNITY): Payer: Self-pay | Admitting: Family

## 2017-10-22 ENCOUNTER — Telehealth (HOSPITAL_COMMUNITY): Payer: Self-pay | Admitting: Internal Medicine

## 2017-10-22 LAB — URINE CULTURE: Culture: 80000 — AB

## 2017-10-22 MED ORDER — PENICILLIN V POTASSIUM 500 MG PO TABS: 500.0000 mg | ORAL_TABLET | Freq: Two times a day (BID) | ORAL | 0 refills | Status: AC

## 2017-10-22 MED FILL — PENICILLIN VK 500 MG TABLET: 500 | 7 days supply | Qty: 14 | Fill #0

## 2017-10-22 NOTE — Telephone Encounter (Signed)
Clinical staff, please let patient know that urine culture was positive for group B Strep germ.  Unclear whether this truly represents a UTI.  Will send rx for penicillin V to pharmacy of record, [facility] on [address] or followup with PCP for further evaluation if symptoms are not improving.  LM

## 2018-01-02 ENCOUNTER — Ambulatory Visit (INDEPENDENT_AMBULATORY_CARE_PROVIDER_SITE_OTHER): Payer: 59 | Admitting: Family Medicine

## 2018-01-02 ENCOUNTER — Other Ambulatory Visit: Payer: Self-pay

## 2018-01-02 ENCOUNTER — Encounter: Payer: Self-pay | Admitting: Family Medicine

## 2018-01-02 DIAGNOSIS — F339 Major depressive disorder, recurrent, unspecified: Secondary | ICD-10-CM

## 2018-01-02 MED ORDER — ESCITALOPRAM OXALATE 10 MG PO TABS: 10.0000 mg | ORAL_TABLET | Freq: Every day | ORAL | 3 refills | Status: DC

## 2018-01-02 MED FILL — ESCITALOPRAM 10 MG TABLET: 10 | 30 days supply | Qty: 30 | Fill #0

## 2018-01-02 NOTE — Progress Notes (Signed)
   Subjective:    Patient ID: Cynthia [name], female    DOB: 04-09-1981, 37 y.o.   MRN: [id number]  HPI Depression- pt feels she needs to restart antidepressants.  She feels she might benefit from talk therapy but w/ twin babies does not have time in her schedule.  Was previously on meds but not sure what she was taking b/c this was 'years' ago.  Interested in weight neutral medication.  Pt doesn't feel sxs are related to the babies- feels more of the stress is related to caring for her mother.  Found out today that dog may have lymphoma.  Feels she doesn't have the bandwidth to deal w/ all that's going on.  Husband is helpful and their relationship is good.   Review of Systems For ROS see HPI     Objective:   Physical Exam  Constitutional: She is oriented to person, place, and time. She appears well-developed and well-nourished. No distress.  obese  HENT:  Head: Normocephalic and atraumatic.  Neurological: She is alert and oriented to person, place, and time.  Skin: Skin is warm and dry.  Psychiatric: She has a normal mood and affect. Her behavior is normal. Thought content normal.  Vitals reviewed.         Assessment & Plan:

## 2018-01-02 NOTE — Patient Instructions (Signed)
Follow up in 3-4 weeks to recheck mood Start the Lexapro once daily Call with any questions or concerns Hang in there!!!  This will get better!

## 2018-01-03 DIAGNOSIS — F339 Major depressive disorder, recurrent, unspecified: Secondary | ICD-10-CM | POA: Insufficient documentation

## 2018-01-03 NOTE — Assessment & Plan Note (Signed)
New to provider, recurrent issue for pt (she has been on medication in the past).  She has a very pathologic relationship w/ her mother and this has come to a head w/ the birth of her twin girls.  She reports things are 'great' with her nuclear family- husband and babies- but the relationship with her mother is finally taking its toll after all these years.  Restart Lexapro.  Total time spent w/ pt, 40 minutes, >50% spent counseling.

## 2018-02-12 DIAGNOSIS — H5213 Myopia, bilateral: Secondary | ICD-10-CM | POA: Diagnosis not present

## 2018-02-12 DIAGNOSIS — H52223 Regular astigmatism, bilateral: Secondary | ICD-10-CM | POA: Diagnosis not present

## 2018-02-13 ENCOUNTER — Encounter: Payer: Self-pay | Admitting: Family Medicine

## 2018-02-13 ENCOUNTER — Other Ambulatory Visit: Payer: Self-pay

## 2018-02-13 ENCOUNTER — Ambulatory Visit: Payer: 59 | Admitting: Family Medicine

## 2018-02-13 VITALS — BP 130/84 | HR 84 | Temp 98.0°F | Resp 16 | Ht 62.0 in | Wt 213.0 lb

## 2018-02-13 DIAGNOSIS — H6982 Other specified disorders of Eustachian tube, left ear: Secondary | ICD-10-CM | POA: Diagnosis not present

## 2018-02-13 DIAGNOSIS — F339 Major depressive disorder, recurrent, unspecified: Secondary | ICD-10-CM

## 2018-02-13 DIAGNOSIS — Q178 Other specified congenital malformations of ear: Secondary | ICD-10-CM | POA: Insufficient documentation

## 2018-02-13 DIAGNOSIS — K219 Gastro-esophageal reflux disease without esophagitis: Secondary | ICD-10-CM | POA: Diagnosis not present

## 2018-02-13 MED ORDER — OMEPRAZOLE 20 MG PO CPDR: 20.0000 mg | DELAYED_RELEASE_CAPSULE | Freq: Every day | ORAL | 3 refills | Status: DC

## 2018-02-13 MED FILL — OMEPRAZOLE 20 MG CAP: 20 | 30 days supply | Qty: 30 | Fill #0

## 2018-02-13 NOTE — Progress Notes (Signed)
   Subjective:    Patient ID: Cynthia [name], female    DOB: 23-Dec-1980, 37 y.o.   MRN: [id number]  HPI Depression- pt feels Lexapro is helping, things are going better.  Much less tearful.  Much less irritable.  GERD- pt had sxs while pregnant and she is now experiencing chest pressure.  sxs are intermittent and she has not correlated this w/ eating.  She has increased her coffee intake.    L ear fluttering- sxs are intermittent x1 month.  'it's strange'.  No drainage from ear.  No pain.   Review of Systems For ROS see HPI     Objective:   Physical Exam  Constitutional: She appears well-developed and well-nourished. No distress.  HENT:  Head: Normocephalic and atraumatic.  Right Ear: Tympanic membrane normal.  Left Ear: Tympanic membrane is retracted.  Nose: Mucosal edema and rhinorrhea present. Right sinus exhibits no maxillary sinus tenderness and no frontal sinus tenderness. Left sinus exhibits no maxillary sinus tenderness and no frontal sinus tenderness.  Mouth/Throat: Mucous membranes are normal. Posterior oropharyngeal erythema (w/ PND) present.  Eyes: Conjunctivae and EOM are normal. Pupils are equal, round, and reactive to light.  Neck: Normal range of motion. Neck supple.  Cardiovascular: Normal rate, regular rhythm and normal heart sounds.  Pulmonary/Chest: Effort normal and breath sounds normal. No respiratory distress. She has no wheezes. She has no rales.  Lymphadenopathy:    She has no cervical adenopathy.  Vitals reviewed.         Assessment & Plan:  Eustachian tube dysfxn- new to provider, recurrent for pt.  Discussed sx and tx.  Pt to start OTC antihistamine daily.  Pt expressed understanding and is in agreement w/ plan.

## 2018-02-13 NOTE — Patient Instructions (Addendum)
Schedule your complete physical in 6 months Continue the Lexapro once daily Start the Omeprazole once daily to decrease acid production Start daily Claritin or Zyrtec to help with ear fluttering Call with any questions or concerns [place]!!

## 2018-02-17 ENCOUNTER — Ambulatory Visit (INDEPENDENT_AMBULATORY_CARE_PROVIDER_SITE_OTHER): Payer: 59 | Admitting: Family Medicine

## 2018-02-17 ENCOUNTER — Encounter: Payer: Self-pay | Admitting: Family Medicine

## 2018-02-17 ENCOUNTER — Other Ambulatory Visit: Payer: Self-pay

## 2018-02-17 VITALS — BP 120/85 | HR 85 | Temp 98.1°F | Resp 16 | Ht 62.0 in | Wt 213.1 lb

## 2018-02-17 DIAGNOSIS — Z0000 Encounter for general adult medical examination without abnormal findings: Secondary | ICD-10-CM

## 2018-02-17 LAB — BASIC METABOLIC PANEL
BUN: 14 mg/dL (ref 6–23)
CALCIUM: 9.4 mg/dL (ref 8.4–10.5)
CO2: 28 meq/L (ref 19–32)
CREATININE: 0.6 mg/dL (ref 0.40–1.20)
Chloride: 105 mEq/L (ref 96–112)
GFR: 119.28 mL/min (ref >60.00)
Glucose, Bld: 91 mg/dL (ref 70–99)
Potassium: 4.2 mEq/L (ref 3.5–5.1)
SODIUM: 138 meq/L (ref 135–145)

## 2018-02-17 LAB — CBC WITH DIFFERENTIAL/PLATELET
BASOS PCT: 0.3 % (ref 0.0–3.0)
Basophils Absolute: 0 10*3/uL (ref 0.0–0.1)
Eosinophils Absolute: 0.3 10*3/uL (ref 0.0–0.7)
Eosinophils Relative: 4.6 % (ref 0.0–5.0)
HCT: 22 % — CL (ref 36.0–46.0)
Hemoglobin: 7.2 g/dL — CL (ref 12.0–15.0)
Lymphocytes Relative: 35.3 % (ref 12.0–46.0)
Lymphs Abs: 2.2 10*3/uL (ref 0.7–4.0)
MCHC: 32.5 g/dL (ref 30.0–36.0)
MCV: 75.9 fl — ABNORMAL LOW (ref 78.0–100.0)
MONO ABS: 0.5 10*3/uL (ref 0.1–1.0)
MONOS PCT: 7.3 % (ref 3.0–12.0)
NEUTROS PCT: 52.5 % (ref 43.0–77.0)
Neutro Abs: 3.3 10*3/uL (ref 1.4–7.7)
PLATELETS: 432 10*3/uL — AB (ref 150.0–400.0)
RBC: 2.9 Mil/uL — ABNORMAL LOW (ref 3.87–5.11)
RDW: 14.5 % (ref 11.5–15.5)
WBC: 6.3 10*3/uL (ref 4.0–10.5)

## 2018-02-17 LAB — HEPATIC FUNCTION PANEL
ALT: 12 U/L (ref 0–35)
AST: 13 U/L (ref 0–37)
Albumin: 4.1 g/dL (ref 3.5–5.2)
Alkaline Phosphatase: 60 U/L (ref 39–117)
BILIRUBIN TOTAL: 0.2 mg/dL (ref 0.2–1.2)
Bilirubin, Direct: 0 mg/dL (ref 0.0–0.3)
Total Protein: 6.7 g/dL (ref 6.0–8.3)

## 2018-02-17 LAB — LIPID PANEL
CHOL/HDL RATIO: 4
Cholesterol: 191 mg/dL (ref 0–200)
HDL: 48 mg/dL (ref >39.00)
LDL CALC: 118 mg/dL — AB (ref 0–99)
NONHDL: 143.11
Triglycerides: 124 mg/dL (ref 0.0–149.0)
VLDL: 24.8 mg/dL (ref 0.0–40.0)

## 2018-02-17 MED FILL — ESCITALOPRAM 10 MG TABLET: 10 | 30 days supply | Qty: 30 | Fill #1

## 2018-02-17 NOTE — Assessment & Plan Note (Signed)
Improved since starting the Lexapro.  No need for dose adjustment or med changes at this time.  Will continue to follow.

## 2018-02-17 NOTE — Assessment & Plan Note (Signed)
New to provider but pt has hx of similar.  Start PPI and reviewed dietary and lifestyle modifications to improve sxs.  Pt expressed understanding and is in agreement w/ plan.

## 2018-02-17 NOTE — Progress Notes (Signed)
   Subjective:    Patient ID: Cynthia [name], female    DOB: 1981-08-13, 37 y.o.   MRN: [id number]  HPI CPE- UTD on GYN, Tdap, flu.     Review of Systems Patient reports no vision/ hearing changes, adenopathy,fever, weight change,  persistant/recurrent hoarseness , swallowing issues, chest pain, palpitations, edema, persistant/recurrent cough, hemoptysis, dyspnea (rest/exertional/paroxysmal nocturnal), gastrointestinal bleeding (melena, rectal bleeding), abdominal pain, bowel changes, GU symptoms (dysuria, hematuria, incontinence), Gyn symptoms (abnormal  bleeding, pain),  syncope, focal weakness, memory loss, numbness & tingling, skin/hair/nail changes, abnormal bruising or bleeding, anxiety, or depression.   + GERD    Objective:   Physical Exam General Appearance:    Alert, cooperative, no distress, appears stated age, obese  Head:    Normocephalic, without obvious abnormality, atraumatic  Eyes:    PERRL, conjunctiva/corneas clear, EOM's intact, fundi    benign, both eyes  Ears:    Normal TM's and external ear canals, both ears  Nose:   Nares normal, septum midline, mucosa normal, no drainage    or sinus tenderness  Throat:   Lips, mucosa, and tongue normal; teeth and gums normal  Neck:   Supple, symmetrical, trachea midline, no adenopathy;    Thyroid: no enlargement/tenderness/nodules  Back:     Symmetric, no curvature, ROM normal, no CVA tenderness  Lungs:     Clear to auscultation bilaterally, respirations unlabored  Chest Wall:    No tenderness or deformity   Heart:    Regular rate and rhythm, S1 and S2 normal, no murmur, rub   or gallop  Breast Exam:    Deferred to GYN  Abdomen:     Soft, non-tender, bowel sounds active all four quadrants,    no masses, no organomegaly  Genitalia:    Deferred to GYN  Rectal:    Extremities:   Extremities normal, atraumatic, no cyanosis or edema  Pulses:   2+ and symmetric all extremities  Skin:   Skin color, texture, turgor normal, no  rashes or lesions  Lymph nodes:   Cervical, supraclavicular, and axillary nodes normal  Neurologic:   CNII-XII intact, normal strength, sensation and reflexes    throughout          Assessment & Plan:

## 2018-02-17 NOTE — Patient Instructions (Signed)
Follow up in 6 months to recheck mood and weight loss progress We'll notify you of your lab results and make any changes if needed Continue to work on healthy diet and regular exercise- you can do it!! Call with any questions or concerns Happy Spring!!!

## 2018-02-17 NOTE — Assessment & Plan Note (Signed)
Pt's PE WNL w/ exception of obesity.  UTD on GYN and immunizations.  Check labs.  Anticipatory guidance provided.  

## 2018-02-18 ENCOUNTER — Other Ambulatory Visit: Payer: Self-pay | Admitting: Family Medicine

## 2018-02-18 ENCOUNTER — Other Ambulatory Visit: Payer: Self-pay | Admitting: General Practice

## 2018-02-18 DIAGNOSIS — D649 Anemia, unspecified: Secondary | ICD-10-CM

## 2018-02-18 LAB — TSH: TSH: 2.07 u[IU]/mL (ref 0.35–4.50)

## 2018-02-18 LAB — VITAMIN D 25 HYDROXY (VIT D DEFICIENCY, FRACTURES): VITD: 7.66 ng/mL — AB (ref 30.00–100.00)

## 2018-02-18 MED ORDER — FERROUS SULFATE 325 (65 FE) MG PO TABS: 325.0000 mg | ORAL_TABLET | Freq: Every day | ORAL | 6 refills | Status: DC

## 2018-02-18 MED ORDER — VITAMIN D (ERGOCALCIFEROL) 1.25 MG (50000 UNIT) PO CAPS: 50000.0000 [IU] | ORAL_CAPSULE | ORAL | 0 refills | Status: DC

## 2018-02-18 MED FILL — VIT D2 1.25 MG (50,000 UNIT: 1.25 MG | 84 days supply | Qty: 12 | Fill #0

## 2018-02-21 ENCOUNTER — Other Ambulatory Visit (INDEPENDENT_AMBULATORY_CARE_PROVIDER_SITE_OTHER): Payer: 59

## 2018-02-21 ENCOUNTER — Other Ambulatory Visit: Payer: 59

## 2018-02-21 DIAGNOSIS — D649 Anemia, unspecified: Secondary | ICD-10-CM | POA: Diagnosis not present

## 2018-02-21 LAB — CBC WITH DIFFERENTIAL/PLATELET
Basophils Absolute: 0 10*3/uL (ref 0.0–0.1)
Basophils Relative: 0.3 % (ref 0.0–3.0)
EOS PCT: 2.8 % (ref 0.0–5.0)
Eosinophils Absolute: 0.2 10*3/uL (ref 0.0–0.7)
HCT: 22.5 % — CL (ref 36.0–46.0)
Hemoglobin: 7.3 g/dL — CL (ref 12.0–15.0)
LYMPHS ABS: 2.6 10*3/uL (ref 0.7–4.0)
Lymphocytes Relative: 31.8 % (ref 12.0–46.0)
MCHC: 32.5 g/dL (ref 30.0–36.0)
MCV: 75.7 fl — AB (ref 78.0–100.0)
MONOS PCT: 6.2 % (ref 3.0–12.0)
Monocytes Absolute: 0.5 10*3/uL (ref 0.1–1.0)
NEUTROS ABS: 4.8 10*3/uL (ref 1.4–7.7)
NEUTROS PCT: 58.9 % (ref 43.0–77.0)
PLATELETS: 432 10*3/uL — AB (ref 150.0–400.0)
RBC: 2.97 Mil/uL — ABNORMAL LOW (ref 3.87–5.11)
RDW: 15.4 % (ref 11.5–15.5)
WBC: 8.1 10*3/uL (ref 4.0–10.5)

## 2018-02-24 ENCOUNTER — Other Ambulatory Visit: Payer: Self-pay | Admitting: General Practice

## 2018-02-24 MED ORDER — FERROUS SULFATE 325 (65 FE) MG PO TABS: 325.0000 mg | ORAL_TABLET | Freq: Two times a day (BID) | ORAL | 6 refills | Status: DC

## 2018-02-27 ENCOUNTER — Encounter: Payer: Self-pay | Admitting: Family Medicine

## 2018-02-27 ENCOUNTER — Ambulatory Visit (INDEPENDENT_AMBULATORY_CARE_PROVIDER_SITE_OTHER): Payer: 59 | Admitting: Family Medicine

## 2018-02-27 ENCOUNTER — Telehealth: Payer: Self-pay | Admitting: Family Medicine

## 2018-02-27 VITALS — BP 121/80 | HR 76 | Resp 16 | Ht 62.0 in | Wt 214.0 lb

## 2018-02-27 DIAGNOSIS — Z3202 Encounter for pregnancy test, result negative: Secondary | ICD-10-CM

## 2018-02-27 DIAGNOSIS — Z0184 Encounter for antibody response examination: Secondary | ICD-10-CM

## 2018-02-27 DIAGNOSIS — N921 Excessive and frequent menstruation with irregular cycle: Secondary | ICD-10-CM | POA: Diagnosis not present

## 2018-02-27 DIAGNOSIS — Z01812 Encounter for preprocedural laboratory examination: Secondary | ICD-10-CM

## 2018-02-27 DIAGNOSIS — Z30430 Encounter for insertion of intrauterine contraceptive device: Secondary | ICD-10-CM | POA: Diagnosis not present

## 2018-02-27 LAB — POCT URINE PREGNANCY: Preg Test, Ur: NEGATIVE

## 2018-02-27 MED ORDER — LEVONORGESTREL 20 MCG/24HR IU IUD
INTRAUTERINE_SYSTEM | Freq: Once | INTRAUTERINE | Status: AC
  Administered 2018-02-27: 15:00:00 via INTRAUTERINE

## 2018-02-27 NOTE — Patient Instructions (Signed)

## 2018-02-27 NOTE — Progress Notes (Signed)
   Subjective:    Patient ID: Cynthia [name] is a 37 y.o. female presenting with Hgb 7.3 (Wants a Mirena)  on 02/27/2018  HPI: Twin babies born 07/14/17 by C-section. Nursing and no cycle until 6 months. Very heavy cycle in  End of February and into March. Bled very heavy x 2 days, then it slowed down. Cycle was around 7 days. Saw Primary care and noted to be quite anemic. Has h/o skipping cycles and has heavy cycles.  Review of Systems  Constitutional: Negative for chills and fever.  Respiratory: Negative for shortness of breath.   Cardiovascular: Negative for chest pain.  Gastrointestinal: Negative for abdominal pain, nausea and vomiting.  Genitourinary: Negative for dysuria.  Skin: Negative for rash.      Objective:    BP 121/80   Pulse 76   Resp 16   Ht 5\' 2"  (1.575 m)   Wt 214 lb (97.1 kg)   LMP 01/31/2018   Breastfeeding? No   BMI 39.14 kg/m  Physical Exam  Constitutional: She is oriented to person, place, and time. She appears well-developed and well-nourished. No distress.  HENT:  Head: Normocephalic and atraumatic.  Eyes: No scleral icterus.  Neck: Neck supple.  Cardiovascular: Normal rate.  Pulmonary/Chest: Effort normal.  Abdominal: Soft.  Genitourinary:  Genitourinary Comments: BUS normal, vagina is pink and rugated, cervix is nulliparous without lesion, uterus is small and anteverted, no adnexal mass or tenderness.   Neurological: She is alert and oriented to person, place, and time.  Skin: Skin is warm and dry.  Psychiatric: She has a normal mood and affect.   Procedure: Patient identified, informed consent performed, signed copy in chart, time out was performed.  Urine pregnancy test negative.  Speculum placed in the vagina.  Cervix visualized.  Cleaned with Betadine x 2.  Grasped anteriourly with a single tooth tenaculum.  Uterus sounded to 8 cm.  Mirena IUD placed per manufacturer's recommendations.  Strings trimmed to 3 cm.   Patient given post  procedure instructions and Mirena care card with expiration date.       Assessment & Plan:   Problem List Items Addressed This Visit      Unprioritized   Menorrhagia with irregular cycle    Needs to contracept, and Mirena will help with bleeding profile--trial of Mirena IUD. Check pelvic sono      Relevant Orders   US PELVIS TRANSVANGINAL NON-OB (TV ONLY)    Other Visit Diagnoses    Pre-procedure lab exam    -  Primary   Relevant Orders   POCT urine pregnancy (Completed)   Encounter for IUD insertion          Total face-to-face time with patient: 15 minutes. Over 50% of encounter was spent on counseling and coordination of care. Return in about 1 month (around 03/27/2018) for iud check.  Donnamae Jude 02/27/2018 2:15 PM

## 2018-02-27 NOTE — Telephone Encounter (Signed)
[place] for MMR titers.  Does she require Hep B titers or other proof of immunity?

## 2018-02-27 NOTE — Telephone Encounter (Signed)
Copied from [place]. Topic: Quick Communication - See Telephone Encounter >> Feb 27, 2018  3:46 PM Boyd Kerbs wrote: CRM for notification.   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  Asking to put order in for this.  She would like to come in tomorrow 3/22 in afternoon if at all possible.  Please call patient.   See Telephone encounter for: 02/27/18.

## 2018-02-28 ENCOUNTER — Ambulatory Visit (HOSPITAL_COMMUNITY)
Admission: RE | Admit: 2018-02-28 | Discharge: 2018-02-28 | Disposition: A | Discharge status: Home / Self Care | Payer: 59 | Source: Ambulatory Visit | Attending: Family Medicine | Admitting: Family Medicine

## 2018-02-28 ENCOUNTER — Other Ambulatory Visit (INDEPENDENT_AMBULATORY_CARE_PROVIDER_SITE_OTHER): Payer: 59

## 2018-02-28 DIAGNOSIS — N921 Excessive and frequent menstruation with irregular cycle: Secondary | ICD-10-CM | POA: Diagnosis not present

## 2018-02-28 DIAGNOSIS — Z0184 Encounter for antibody response examination: Secondary | ICD-10-CM | POA: Diagnosis not present

## 2018-02-28 DIAGNOSIS — N920 Excessive and frequent menstruation with regular cycle: Secondary | ICD-10-CM | POA: Diagnosis not present

## 2018-02-28 NOTE — Telephone Encounter (Signed)
She does need MMR and Hep B right now.   I will place orders and patient coming in at 1:30 today.

## 2018-02-28 NOTE — Assessment & Plan Note (Signed)
Needs to contracept, and Mirena will help with bleeding profile--trial of Mirena IUD. Check pelvic sono

## 2018-03-03 ENCOUNTER — Encounter: Payer: Self-pay | Admitting: General Practice

## 2018-03-03 LAB — MEASLES/MUMPS/RUBELLA IMMUNITY
Mumps IgG: 46.3 AU/mL
RUBELLA: 19.1 {index}
Rubeola IgG: >300 AU/mL

## 2018-03-03 LAB — HEPATITIS B SURFACE ANTIBODY,QUALITATIVE: Hep B S Ab: REACTIVE — AB

## 2018-03-14 ENCOUNTER — Telehealth: Payer: Self-pay | Admitting: Family Medicine

## 2018-03-14 DIAGNOSIS — D649 Anemia, unspecified: Secondary | ICD-10-CM

## 2018-03-14 DIAGNOSIS — Z0184 Encounter for antibody response examination: Secondary | ICD-10-CM

## 2018-03-14 NOTE — Telephone Encounter (Signed)
Called and advised pt. Varicella titer ordered and TB skin test ordered. Pt scheduled for Monday at 4:15.

## 2018-03-14 NOTE — Addendum Note (Signed)
Addended by: Davis Gourd on: 03/14/2018 12:59 PM   Modules accepted: Orders

## 2018-03-14 NOTE — Telephone Encounter (Signed)
Pt has had MMR and Hep B titers, Tdap last given 05/2017. Ok for lab orders for varicella titer and two step TB 

## 2018-03-14 NOTE — Telephone Encounter (Signed)
[place] for Varicella titer and 2 step TB (or Quantiferon if school accepts this)

## 2018-03-14 NOTE — Telephone Encounter (Signed)
Copied from [place]. Topic: Quick Communication - See Telephone Encounter >> Mar 14, 2018 11:31 AM Bea Graff, NT wrote: CRM for notification. See Telephone encounter for: 03/14/18. Pt requesting orders for labs to be put in that she needs completed for school. Requesting Varicella Titer, Hep B titer, TB 2 step skin test (need to be 1-3 weeks apart), tetanus shot within the last 10 years (or a note proving this) as well as a note that states she had a physical in the last 12 months.

## 2018-03-17 ENCOUNTER — Ambulatory Visit: Payer: 59 | Admitting: Obstetrics & Gynecology

## 2018-03-17 ENCOUNTER — Other Ambulatory Visit (INDEPENDENT_AMBULATORY_CARE_PROVIDER_SITE_OTHER): Payer: 59

## 2018-03-17 ENCOUNTER — Encounter: Payer: Self-pay | Admitting: Obstetrics & Gynecology

## 2018-03-17 ENCOUNTER — Telehealth: Payer: Self-pay | Admitting: Obstetrics & Gynecology

## 2018-03-17 VITALS — BP 138/93 | HR 85 | Wt 215.0 lb

## 2018-03-17 DIAGNOSIS — Z0184 Encounter for antibody response examination: Secondary | ICD-10-CM | POA: Diagnosis not present

## 2018-03-17 DIAGNOSIS — N921 Excessive and frequent menstruation with irregular cycle: Secondary | ICD-10-CM | POA: Diagnosis not present

## 2018-03-17 LAB — CBC WITH DIFFERENTIAL/PLATELET
BASOS ABS: 21 {cells}/uL (ref 0–200)
BASOS PCT: 0.3 %
EOS ABS: 217 {cells}/uL (ref 15–500)
Eosinophils Relative: 3.1 %
HEMATOCRIT: 31.3 % — AB (ref 35.0–45.0)
Hemoglobin: 10 g/dL — ABNORMAL LOW (ref 11.7–15.5)
Lymphs Abs: 2037 cells/uL (ref 850–3900)
MCH: 26.4 pg — AB (ref 27.0–33.0)
MCHC: 31.9 g/dL — AB (ref 32.0–36.0)
MCV: 82.6 fL (ref 80.0–100.0)
MPV: 9.5 fL (ref 7.5–12.5)
Monocytes Relative: 6 %
Neutro Abs: 4305 cells/uL (ref 1500–7800)
Neutrophils Relative %: 61.5 %
Platelets: 324 10*3/uL (ref 140–400)
RBC: 3.79 10*6/uL — ABNORMAL LOW (ref 3.80–5.10)
RDW: 18.5 % — ABNORMAL HIGH (ref 11.0–15.0)
Total Lymphocyte: 29.1 %
WBC: 7 10*3/uL (ref 3.8–10.8)
WBCMIX: 420 {cells}/uL (ref 200–950)

## 2018-03-17 MED ORDER — MEGESTROL ACETATE 40 MG PO TABS: 40.0000 mg | ORAL_TABLET | Freq: Two times a day (BID) | ORAL | 5 refills | Status: DC

## 2018-03-17 MED FILL — MEGESTROL 40 MG TABLET: 40 | 30 days supply | Qty: 60 | Fill #0

## 2018-03-17 NOTE — Telephone Encounter (Signed)
Ok for CBC w/ diff

## 2018-03-17 NOTE — Telephone Encounter (Signed)
IUD 2 wks ago. Heavy bleeding period now. Cannot find string. Having labs at pcp as well to check hemoglobin. It was 7.3 at last check. Please advise [phone].

## 2018-03-17 NOTE — Telephone Encounter (Signed)
Orders placed.

## 2018-03-17 NOTE — Addendum Note (Signed)
Addended by: Davis Gourd on: 03/17/2018 09:05 AM   Modules accepted: Orders

## 2018-03-17 NOTE — Telephone Encounter (Signed)
Please advise 

## 2018-03-17 NOTE — Telephone Encounter (Signed)
Patient is also requesting to have her hemoglobin as well today at her appt. Please advise

## 2018-03-17 NOTE — Progress Notes (Signed)
   Subjective:    Patient ID: Cynthia [name], female    DOB: Aug 05, 1981, 37 y.o.   MRN: [id number]  HPI  37 yo married P2 here for string check. She had a mirena placed 02/27/18 for treatment of menorrhagia. Her hbg was found to be down to 7.3. She has now started a period and it is decreased from its previous heaviness, but is still heavy none the less. She is getting a hbg check today with her fam med doc.    Review of Systems     Objective:   Physical Exam Breathing, conversing, and ambulating normally Well nourished, well hydrated female, no apparent distress IUD strings about 2 cm out from cervical os A moderate amount of bright red blood is noted.     Assessment & Plan:  Menorrhagia, treated with Mirena I will give her megace to be used with any heavy periods.

## 2018-03-17 NOTE — Progress Notes (Signed)
Pt c/o heavy bleeding & can't feel her IUD strings

## 2018-03-18 ENCOUNTER — Ambulatory Visit: Payer: 59 | Admitting: Physician Assistant

## 2018-03-18 ENCOUNTER — Other Ambulatory Visit: Payer: Self-pay

## 2018-03-18 ENCOUNTER — Encounter: Payer: Self-pay | Admitting: Family Medicine

## 2018-03-18 ENCOUNTER — Ambulatory Visit: Payer: 59 | Admitting: Family Medicine

## 2018-03-18 ENCOUNTER — Ambulatory Visit: Payer: Self-pay

## 2018-03-18 VITALS — BP 114/80 | HR 79 | Temp 97.8°F | Resp 16 | Ht 62.0 in | Wt 213.0 lb

## 2018-03-18 DIAGNOSIS — E162 Hypoglycemia, unspecified: Secondary | ICD-10-CM

## 2018-03-18 DIAGNOSIS — R61 Generalized hyperhidrosis: Secondary | ICD-10-CM | POA: Diagnosis not present

## 2018-03-18 LAB — BASIC METABOLIC PANEL
BUN: 22 mg/dL (ref 6–23)
CHLORIDE: 102 meq/L (ref 96–112)
CO2: 26 meq/L (ref 19–32)
CREATININE: 0.66 mg/dL (ref 0.40–1.20)
Calcium: 9.3 mg/dL (ref 8.4–10.5)
GFR: 106.81 mL/min (ref >60.00)
Glucose, Bld: 89 mg/dL (ref 70–99)
POTASSIUM: 4 meq/L (ref 3.5–5.1)
Sodium: 137 mEq/L (ref 135–145)

## 2018-03-18 LAB — HEMOGLOBIN A1C: HEMOGLOBIN A1C: 4.5 % — AB (ref 4.6–6.5)

## 2018-03-18 LAB — TSH: TSH: 2.54 u[IU]/mL (ref 0.35–4.50)

## 2018-03-18 LAB — VARICELLA ZOSTER ABS, IGG/IGM
Varicella IgM: <0.91 index (ref 0.00–0.90)
Varicella zoster IgG: 2620 index (ref >165)

## 2018-03-18 NOTE — Telephone Encounter (Signed)
Pt. Reports breakfast she was "extremely diaphoretic.I ate and took a shower and started sweating again." Slight dizziness. Home alone with 13 month old babies(twins). Husband is at work. Appointment made for today. States she had gestational diabetes and will find her glucometer and check her blood sugar. Instructed if she becomes more dizzy, has shortness of breath or any other symptoms - to call 911. Verbalizes understanding.  Reason for Disposition . [1] SEVERE sweating (e.g., drenched with sweat) AND [2] cause unknown  Answer Assessment - Initial Assessment Questions 1. ONSET: "When did the sweating start?"      Started this morning 2. LOCATION: "What part of your body has excessive sweating?" (e.g., entire body; just face, underarms, palms, or soles of feet).      Face and back: lower extremities 3. SEVERITY: "How bad is the sweating?"    (Scale 1-10; or mild, moderate, severe)   -  MILD (1-3): doesn't interfere with normal activities    -  MODERATE (4-7): interferes with normal activities (e.g., work or school) or awakens from sleep; causes embarrassment in social situations    -  SEVERE (8-10): drenching sweats and has to change bed clothes or bed linens.     Moderate 4. CAUSE: "What do you think is causing the sweating?"     At first thought it was low blood sugar 5. FEVER: "Have you been having fevers?"     No 6. OTHER SYMPTOMS: "Do you have any other symptoms?" (e.g., chest pain, difficulty breathing, lightheadedness, weight loss)     No  Protocols used: [facility]

## 2018-03-18 NOTE — Progress Notes (Signed)
   Subjective:    Patient ID: Cynthia [name], female    DOB: Apr 10, 1981, 37 y.o.   MRN: [id number]  HPI Diaphoresis- pt had 'really bad episode of sweating out of nowhere'.  Face and back 'were pretty much drenched'.  She had not eaten at that point so she ate an [employer] (high protein).  Still didn't feel better.  Decided to take a shower.  Did not improve after shower.  'it looked like I went running for 5 miles in humid weather'.  sxs are improved from this morning.  Denies feeling poorly or shaky.  Some intermittent light headedness- 'just a little off balance'.  Not impacted by position change.  Pt had eaten normally last night- chicken breast, vegetables, salad, nuts.  No CP, no SOB.  Has not yet started the Megace.   Review of Systems For ROS see HPI     Objective:   Physical Exam  Constitutional: She is oriented to person, place, and time. She appears well-developed and well-nourished. No distress.  obese  HENT:  Head: Normocephalic and atraumatic.  Eyes: Pupils are equal, round, and reactive to light. Conjunctivae and EOM are normal.  Neck: Normal range of motion. Neck supple. No thyromegaly present.  Cardiovascular: Normal rate, regular rhythm, normal heart sounds and intact distal pulses.  No murmur heard. Pulmonary/Chest: Effort normal and breath sounds normal. No respiratory distress.  Abdominal: Soft. She exhibits no distension. There is no tenderness.  Musculoskeletal: She exhibits no edema.  Lymphadenopathy:    She has no cervical adenopathy.  Neurological: She is alert and oriented to person, place, and time.  Skin: Skin is warm and dry.  Psychiatric: She has a normal mood and affect. Her behavior is normal.  Vitals reviewed.         Assessment & Plan:  Diaphoresis- new.  Suspect pt had a hypoglycemic episode this AM as she was up early to feed her twins (~3:30am) and did not eat until 8am.  Pt's vitals are stable in office, asymptomatic, no distress.  Will  check labs to r/o thyroid issues, electrolyte imbalance, and A1C given hx of gestational diabetes.  Discussed that since she is following a low carb diet, she may have more of these episodes unless she adds snacks between meals.  Pt expressed understanding and is in agreement w/ plan.

## 2018-03-18 NOTE — Patient Instructions (Signed)
Follow up as needed or as scheduled We'll notify you of your lab results and make any changes if needed Make sure you are eating regularly- add snacks in between like nuts, yogurt, cheese, PB crackers, etc Call with any questions or concerns Hang in there!!!

## 2018-03-18 NOTE — Telephone Encounter (Signed)
Spoke with patient and she is aware that providers would rather her be seen this morning.  Dr. Birdie Riddle has a 10:30 appt available so patient is coming in to be seen.

## 2018-03-18 NOTE — Telephone Encounter (Signed)
Bethany please call her and assess patient. She can be seen this morning with me or Dr. Birdie Riddle -- do not want her to wait until this afternoon giving her history of anemia, etc.

## 2018-03-19 ENCOUNTER — Encounter: Payer: Self-pay | Admitting: General Practice

## 2018-03-19 LAB — TB SKIN TEST
Induration: 0 mm
TB Skin Test: NEGATIVE

## 2018-03-20 ENCOUNTER — Ambulatory Visit: Payer: 59 | Admitting: Obstetrics & Gynecology

## 2018-03-24 ENCOUNTER — Other Ambulatory Visit (INDEPENDENT_AMBULATORY_CARE_PROVIDER_SITE_OTHER): Payer: 59

## 2018-03-24 ENCOUNTER — Ambulatory Visit: Payer: 59 | Admitting: Obstetrics & Gynecology

## 2018-03-24 DIAGNOSIS — Z0184 Encounter for antibody response examination: Secondary | ICD-10-CM

## 2018-03-24 MED FILL — ESCITALOPRAM 10 MG TABLET: 10 | 30 days supply | Qty: 30 | Fill #2

## 2018-03-24 MED FILL — OMEPRAZOLE 20 MG CAP: 20 | 30 days supply | Qty: 30 | Fill #1

## 2018-03-26 LAB — TB SKIN TEST
INDURATION: 0 mm
TB SKIN TEST: NEGATIVE

## 2018-04-01 ENCOUNTER — Encounter: Payer: Self-pay | Admitting: Family Medicine

## 2018-04-02 ENCOUNTER — Telehealth: Payer: Self-pay | Admitting: Obstetrics & Gynecology

## 2018-04-02 ENCOUNTER — Encounter: Payer: Self-pay | Admitting: *Deleted

## 2018-04-02 NOTE — Telephone Encounter (Signed)
Relation to pt: self  Call back number: [phone] (M)   Reason for call:  Patient checking on the status of my chart message: patient states employer is requesting a letter reflecting physical was conducted within the last 12 month and essential everything was fine. Please print vaccination report (reflecting especially teatnus vaccination). Please include on a separate piece of paper lab results. Patient will pick up when letter and reports are ready.

## 2018-04-02 NOTE — Telephone Encounter (Signed)
Pt is continuing to bleed on Magace 40 mg BID.  I discussed with Dr Hulan Fray and she has increased the Megace to 80 mg BID.  Pt to f/u with IUD string check next week and will reevaluate her bleeding then.

## 2018-04-02 NOTE — Telephone Encounter (Signed)
Medication is not stopping the bleeding it has only slowed it down some. Pt still using pads and tampons every day. Today pt did not take med to see what would happen and her bleeding became heavy again. Pt states that is proof medicine is helping to slow it down but not stop it. Pt ph [phone]. Please advise.

## 2018-04-07 ENCOUNTER — Ambulatory Visit: Payer: 59 | Admitting: Obstetrics & Gynecology

## 2018-04-10 ENCOUNTER — Ambulatory Visit: Payer: 59 | Admitting: Obstetrics & Gynecology

## 2018-04-10 ENCOUNTER — Encounter: Payer: Self-pay | Admitting: Obstetrics & Gynecology

## 2018-04-10 VITALS — BP 134/89 | HR 87 | Resp 16 | Ht 62.0 in | Wt 214.0 lb

## 2018-04-10 DIAGNOSIS — R635 Abnormal weight gain: Secondary | ICD-10-CM

## 2018-04-10 NOTE — Progress Notes (Signed)
   Subjective:    Patient ID: Cynthia [name], female    DOB: 02-11-1981, 37 y.o.   MRN: [id number]  HPI 37 yo P2 here for follow up. She is concerned because she is still bleeding, started the megace BID recently. The bleeding is lighter than it was before she got the Mirena (on 02-27-18). Prior to the Mirena her HBG was 7.2, now it is 10. A recent u/s was normal and showed the IUD to be in the correct place.  She is also concerned that she can't seem to lose weight. She is doing a low carb diet. Her TSH was normal last month.  Review of Systems     Objective:   Physical Exam Breathing, conversing, and ambulating normally Well hydrated morbidly obese pleasant female, no apparent distress Abd- benign I saw her IUD strings at a visit about 2 weeks ago     Assessment & Plan:  Difficulty losing weight- refer to the bariatric office DUB- I have offered an ablation but at this time she prefers to continue with the Mirena and megace prn.

## 2018-05-01 ENCOUNTER — Ambulatory Visit: Payer: Self-pay | Admitting: *Deleted

## 2018-05-01 NOTE — Telephone Encounter (Signed)
Spoke with patient. She is going to call GYN to discuss with them. She will determine if she still needs tomorrow's appointment.  If not, she will cancel.

## 2018-05-01 NOTE — Telephone Encounter (Signed)
It's ok to see me tomorrow but I would first have her call her GYN and alert them to the symptoms since it started after IUD placement

## 2018-05-01 NOTE — Telephone Encounter (Signed)
Pt called with wanting to make an appointment with her pcp. She is having hot flashes and dizziness that started right after she had the IUD placed and also started on a keto diet. She stopped the diet but is still having the sweating. She denies other symptoms. She is having spotting but that has been going on with the placement of the IUD. The spotting is down to every couple of days now. Appointment made as requested with her pcp. Will route to flow at [facility]. Advised to call back for any concerns, pt voiced understanding.  Reason for Disposition . Hormonal IUD (e.g., Mirena), questions about  Answer Assessment - Initial Assessment Questions 1. TYPE: What type of IUD do you have?    - Copper IUD (Cu-T380A, ParaGard (Reg [employer] TM Off.))   - Hormonal IUD (LNG-IUS, levonorgestrel, Mirena (Reg [employer] TM Off.))     Mirena 2. START DATE:  When was your IUD inserted?     March 20th 3. SYMPTOM: What is the main symptom (or question) you're concerned about?"      Having hot flashes, dizziness 4. ONSET: "When did the  ________ start?"     Started after the IUD placement and has gotten worse 5. VAGINAL BLEEDING:  Are you having any unusual vaginal bleeding?    - NONE   - SPOTTING: spotting, or pinkish / brownish mucous discharge; does not fill panti-liner or pad    - MILD:  less than 1 pad / hour; less than patient's usual menstrual bleeding   - MODERATE: 1-2 pads / hour; small-medium blood clots (e.g., pea, grape, small coin)    - SEVERE: soaking 2 or more pads/hour for 2 or more hours; bleeding not contained by pads or continuous red blood from vagina; large blood clots (e.g., golf ball, large coin)      Spotting every couple days 6. PAIN: "Is there any pain?" If so, ask: "How bad is it?" (Scale: 1-10; mild, moderate, severe)     no 7. FEVER:"Is there a fever?" If yes, ask: "What is the temperature, how was it measured, and when did it start?"     no 8. PREGNANCY:  "Are you concerned that you might be pregnant?" "When was your last menstrual period?"     No  Protocols used: CONTRACEPTION - IUD [place]

## 2018-05-02 ENCOUNTER — Ambulatory Visit: Payer: 59 | Admitting: Family Medicine

## 2018-05-06 ENCOUNTER — Encounter: Payer: Self-pay | Admitting: Family Medicine

## 2018-05-06 ENCOUNTER — Ambulatory Visit: Payer: 59 | Admitting: Family Medicine

## 2018-05-06 VITALS — BP 126/81 | HR 95 | Ht 62.0 in | Wt 220.0 lb

## 2018-05-06 DIAGNOSIS — R232 Flushing: Secondary | ICD-10-CM

## 2018-05-06 DIAGNOSIS — E282 Polycystic ovarian syndrome: Secondary | ICD-10-CM

## 2018-05-06 MED FILL — MEGESTROL 40 MG TABLET: 40 | 30 days supply | Qty: 60 | Fill #1

## 2018-05-06 MED FILL — ESCITALOPRAM 10 MG TABLET: 10 | 30 days supply | Qty: 30 | Fill #3

## 2018-05-06 NOTE — Progress Notes (Signed)
Pt states IUD came out yesterday- PT unsure if she wants IUD again or a different method of birth control

## 2018-05-06 NOTE — Progress Notes (Signed)
   Subjective:    Patient ID: Cynthia [name] is a 37 y.o. female presenting with IUD issues  on 05/06/2018  HPI: Hade IUD and keto diet at the same time. Having hot flashes. Unsure if this is related to Mirena. Mirena came out after significant cramping and bleeding. Last Hgb up. Still not interested in getting pregnant. Has h/o PCOS and endometriosis. Begins PAs chool tomortow. Wants to continue Megace which helps her bleeding.  Review of Systems  Constitutional: Negative for chills and fever.  Respiratory: Negative for shortness of breath.   Cardiovascular: Negative for chest pain.  Gastrointestinal: Negative for abdominal pain, nausea and vomiting.  Genitourinary: Positive for vaginal bleeding. Negative for dysuria.  Skin: Negative for rash.      Objective:    BP 126/81   Pulse 95   Ht 5\' 2"  (1.575 m)   Wt 220 lb (99.8 kg)   Breastfeeding? No   BMI 40.24 kg/m  Physical Exam  Constitutional: She is oriented to person, place, and time. She appears well-developed and well-nourished. No distress.  HENT:  Head: Normocephalic and atraumatic.  Eyes: No scleral icterus.  Neck: Neck supple.  Cardiovascular: Normal rate.  Pulmonary/Chest: Effort normal.  Abdominal: Soft.  Neurological: She is alert and oriented to person, place, and time.  Skin: Skin is warm and dry.  Psychiatric: She has a normal mood and affect.        Assessment & Plan:   Problem List Items Addressed This Visit      Unprioritized   PCOS (polycystic ovarian syndrome)    Still needs contraception and endometrial protection. Use condoms as needed--consider OC's vs. Other if needed.       Other Visit Diagnoses    Hot flashes    -  Primary   ?related to diet or Mirena--trial of 4 wks off meds and see how cycles are and if hot flashes improve. If no improvement, check [facility]      Total face-to-face time with patient: 25 minutes. Over 50% of encounter was spent on counseling and coordination of  care. Return in about 1 month (around 06/03/2018).  Donnamae Jude 05/06/2018 4:16 PM

## 2018-05-06 NOTE — Assessment & Plan Note (Signed)
Still needs contraception and endometrial protection. Use condoms as needed--consider OC's vs. Other if needed.

## 2018-05-06 NOTE — Patient Instructions (Signed)

## 2018-05-16 ENCOUNTER — Encounter

## 2018-05-16 ENCOUNTER — Encounter: Payer: 59 | Admitting: Family Medicine

## 2018-06-16 ENCOUNTER — Ambulatory Visit (INDEPENDENT_AMBULATORY_CARE_PROVIDER_SITE_OTHER): Payer: Self-pay | Admitting: Nurse Practitioner

## 2018-06-16 VITALS — BP 115/80 | HR 94 | Temp 98.0°F | Resp 18 | Wt 222.2 lb

## 2018-06-16 DIAGNOSIS — J069 Acute upper respiratory infection, unspecified: Secondary | ICD-10-CM

## 2018-06-16 MED ORDER — FLUTICASONE PROPIONATE 50 MCG/ACT NA SUSP: 2.0000 | Freq: Every day | NASAL | 0 refills | Status: DC

## 2018-06-16 MED ORDER — PSEUDOEPH-BROMPHEN-DM 30-2-10 MG/5ML PO SYRP: 5.0000 mL | ORAL_SOLUTION | Freq: Four times a day (QID) | ORAL | 0 refills | Status: AC | PRN

## 2018-06-16 MED ORDER — AZITHROMYCIN 250 MG PO TABS: ORAL_TABLET | ORAL | 0 refills | Status: AC

## 2018-06-16 MED FILL — FLUTICASONE PROP 50 MCG SPR: 50 | 30 days supply | Qty: 16 | Fill #0

## 2018-06-16 MED FILL — BROMPHENIR-PSEUDOEPHED-DM S: 30-2-10 | 8 days supply | Qty: 150 | Fill #0

## 2018-06-16 MED FILL — AZITHROMYCIN 250 MG TABLET: 250 | 5 days supply | Qty: 6 | Fill #0

## 2018-06-16 NOTE — Progress Notes (Signed)
Subjective:  Cynthia [name] is a 37 y.o. female who presents for evaluation of URI like symptoms.  Symptoms include bilateral ear pressure/pain, coryza, chills, fever: suspected fevers but not measured at home, lightheadedness, nasal congestion, post nasal drip and sore throat.  Onset of symptoms was 8 days ago, and has been unchanged since that time.  Treatment to date:  decongestants and NSAIDs.  High risk factors for influenza complications:  none.  The following portions of the patient's history were reviewed and updated as appropriate:  allergies, current medications and past medical history.  Constitutional: positive for anorexia, chills, fatigue and fevers, negative for night sweats, sweats and weight loss Eyes: negative Ears, nose, mouth, throat, and face: positive for nasal congestion, sore throat and bilateral ear fullness/pressure, negative for ear drainage, earaches and hoarseness Respiratory: positive for cough and sputum, negative for asthma, chronic bronchitis, pleurisy/chest pain, pneumonia, stridor and wheezing Cardiovascular: negative Gastrointestinal: positive for decreased appetite, negative for abdominal pain, change in bowel habits, constipation, diarrhea, nausea and vomiting Neurological: positive for dizziness and headaches, negative for coordination problems, paresthesia, seizures, speech problems, tremors, vertigo and weakness Allergic/Immunologic: negative Objective:  BP 115/80 (BP Location: Right Arm, Patient Position: Sitting, Cuff Size: Normal)   Pulse 94   Temp 98 F (36.7 C) (Oral)   Resp 18   Wt 222 lb 3.2 oz (100.8 kg)   SpO2 98%   BMI 40.64 kg/m   Physical Exam  Constitutional: She is oriented to person, place, and time. She appears well-developed and well-nourished. No distress.  HENT:  Head: Normocephalic and atraumatic.  Bilateral mucoid middle ear fluid, tubinates swollen and inflammed, no sinus tenderness  Eyes: Pupils are equal, round, and  reactive to light. Conjunctivae and EOM are normal. Right eye exhibits no discharge. Left eye exhibits no discharge.  Neck: Normal range of motion. Neck supple. No tracheal deviation present. No thyromegaly present.  Cardiovascular: Normal rate, regular rhythm and normal heart sounds.  Pulmonary/Chest: Effort normal and breath sounds normal. No respiratory distress. She has no wheezes.  Abdominal: Soft. Bowel sounds are normal. She exhibits no distension. There is no tenderness.  Neurological: She is alert and oriented to person, place, and time.  Skin: Skin is warm and dry.  Psychiatric: She has a normal mood and affect. Her behavior is normal.    Assessment:  Acute Upper Respiratory Infection   Plan:   Exam findings, diagnosis etiology and medication use and indications reviewed with patient. Follow- Up and discharge instructions provided. No emergent/urgent issues found on exam.  Patient verbalized understanding of information provided and agrees with plan of care (POC), all questions answered.  Acute Upper Respiratory Infection -Azithromycin 250mg  tablets as directed. -Fluticasone Nasal spray, 2 sprays in each nostril daily for 10 days. -Bromfed cough syrup- 80mL qid prn cough -Increase fluids. -Tylenol or Ibuprofen for pain, fever or general discomfort. -Humidifier or vaporizer.  Meds ordered this encounter  Medications  . azithromycin (ZITHROMAX) 250 MG tablet    Sig: Take as directed.    Dispense:  6 tablet    Refill:  0    Order Specific Question:   Supervising Provider    Answer:   Ricard Dillon [[id number]]  . fluticasone (FLONASE) 50 MCG/ACT nasal spray    Sig: Place 2 sprays into both nostrils daily for 10 days.    Dispense:  16 g    Refill:  0    Order Specific Question:   Supervising Provider    Answer:  JENKINS, JOHN E [[id number]]  . brompheniramine-pseudoephedrine-DM 30-2-10 MG/5ML syrup    Sig: Take 5 mLs by mouth 4 (four) times daily as needed for up to 7 days.     Dispense:  150 mL    Refill:  0    Order Specific Question:   Supervising Provider    Answer:   Ricard Dillon [phone]

## 2018-06-16 NOTE — Patient Instructions (Signed)
Upper Respiratory Infection, Adult Most upper respiratory infections (URIs) are a viral infection of the air passages leading to the lungs. A URI affects the nose, throat, and upper air passages. The most common type of URI is nasopharyngitis and is typically referred to as "the common cold." URIs run their course and usually go away on their own. Most of the time, a URI does not require medical attention, but sometimes a bacterial infection in the upper airways can follow a viral infection. This is called a secondary infection. Sinus and middle ear infections are common types of secondary upper respiratory infections. Bacterial pneumonia can also complicate a URI. A URI can worsen asthma and chronic obstructive pulmonary disease (COPD). Sometimes, these complications can require emergency medical care and may be life threatening. What are the causes? Almost all URIs are caused by viruses. A virus is a type of germ and can spread from one person to another. What increases the risk? You may be at risk for a URI if:  You smoke.  You have chronic heart or lung disease.  You have a weakened defense (immune) system.  You are very young or very old.  You have nasal allergies or asthma.  You work in crowded or poorly ventilated areas.  You work in health care facilities or schools.  What are the signs or symptoms? Symptoms typically develop 2-3 days after you come in contact with a cold virus. Most viral URIs last 7-10 days. However, viral URIs from the influenza virus (flu virus) can last 14-18 days and are typically more severe. Symptoms may include:  Runny or stuffy (congested) nose.  Sneezing.  Cough.  Sore throat.  Headache.  Fatigue.  Fever.  Loss of appetite.  Pain in your forehead, behind your eyes, and over your cheekbones (sinus pain).  Muscle aches.  How is this diagnosed? Your health care provider may diagnose a URI by:  Physical exam.  Tests to check that your  symptoms are not due to another condition such as: ? Strep throat. ? Sinusitis. ? Pneumonia. ? Asthma.  How is this treated? A URI goes away on its own with time. It cannot be cured with medicines, but medicines may be prescribed or recommended to relieve symptoms. Medicines may help:  Reduce your fever.  Reduce your cough.  Relieve nasal congestion.  Follow these instructions at home:  Take medicines only as directed by your health care provider.  Gargle warm saltwater or take cough drops to comfort your throat as directed by your health care provider.  Use a warm mist humidifier or inhale steam from a shower to increase air moisture. This may make it easier to breathe.  Drink enough fluid to keep your urine clear or pale yellow.  Eat soups and other clear broths and maintain good nutrition.  Rest as needed.  Return to work when your temperature has returned to normal or as your health care provider advises. You may need to stay home longer to avoid infecting others. You can also use a face mask and careful hand washing to prevent spread of the virus.  Increase the usage of your inhaler if you have asthma.  Do not use any tobacco products, including cigarettes, chewing tobacco, or electronic cigarettes. If you need help quitting, ask your health care provider. How is this prevented? The best way to protect yourself from getting a cold is to practice good hygiene.  Avoid oral or hand contact with people with cold symptoms.  Wash your   hands often if contact occurs.  There is no clear evidence that vitamin C, vitamin E, echinacea, or exercise reduces the chance of developing a cold. However, it is always recommended to get plenty of rest, exercise, and practice good nutrition. Contact a health care provider if:  You are getting worse rather than better.  Your symptoms are not controlled by medicine.  You have chills.  You have worsening shortness of breath.  You have  brown or red mucus.  You have yellow or brown nasal discharge.  You have pain in your face, especially when you bend forward.  You have a fever.  You have swollen neck glands.  You have pain while swallowing.  You have white areas in the back of your throat. Get help right away if:  You have severe or persistent: ? Headache. ? Ear pain. ? Sinus pain. ? Chest pain.  You have chronic lung disease and any of the following: ? Wheezing. ? Prolonged cough. ? Coughing up blood. ? A change in your usual mucus.  You have a stiff neck.  You have changes in your: ? Vision. ? Hearing. ? Thinking. ? Mood. This information is not intended to replace advice given to you by your health care provider. Make sure you discuss any questions you have with your health care provider. Document Released: 05/22/2001 Document Revised: 07/29/2016 Document Reviewed: 03/03/2014 Elsevier Interactive Patient Education  2018 [employer]  

## 2018-06-18 DIAGNOSIS — H10012 Acute follicular conjunctivitis, left eye: Secondary | ICD-10-CM | POA: Diagnosis not present

## 2018-06-18 MED FILL — TOBRAMYCIN-DEXAMETH OPHTH S: 0.3-0.1 | 15 days supply | Qty: 3 | Fill #0

## 2018-08-04 MED FILL — MEGESTROL 40 MG TABLET: 40 | 30 days supply | Qty: 60 | Fill #2

## 2018-09-03 ENCOUNTER — Other Ambulatory Visit: Payer: Self-pay | Admitting: Family Medicine

## 2018-09-03 MED FILL — ESCITALOPRAM 10 MG TABLET: 10 | 30 days supply | Qty: 30 | Fill #0

## 2018-10-08 IMAGING — US US OB EACH ADDL GEST<[ID]
1 series · 15 of 28 positions shown · non-contrast
Comparison: No priors.

CLINICAL DATA: 35-year-old pregnant female with vaginal bleeding.

EXAM:
TWIN OBSTETRIC <14WK US AND TRANSVAGINAL OB US

[Series 1: us ob each addl gest<(id) · 15 of 28 slices shown]
[im 1/28]
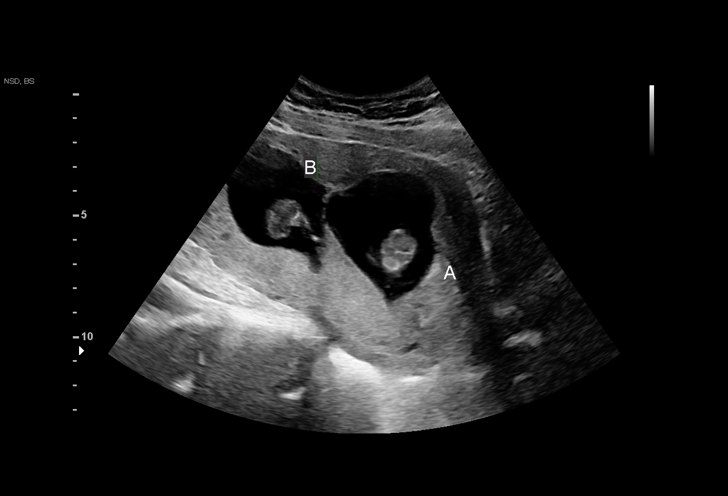
[im 3/28]
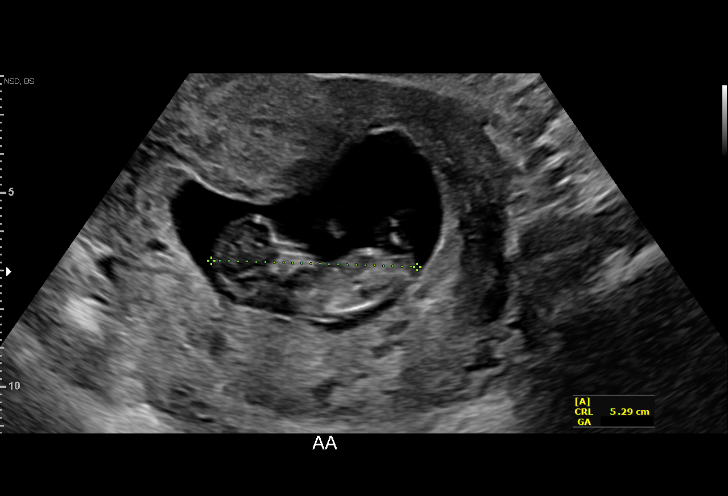
[im 5/28]
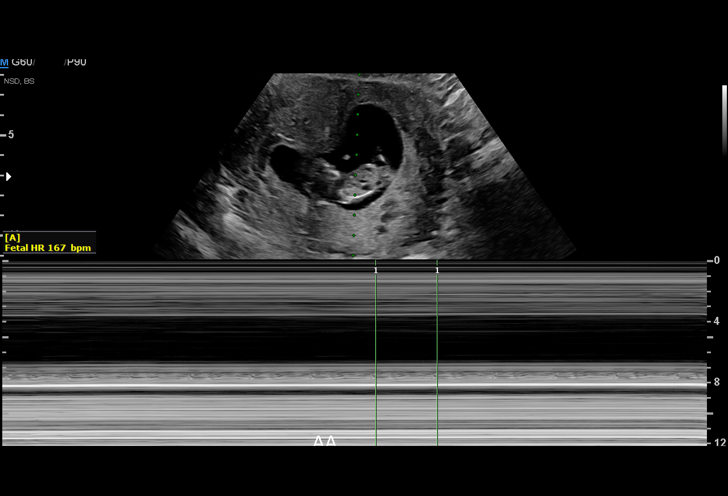
[im 7/28]
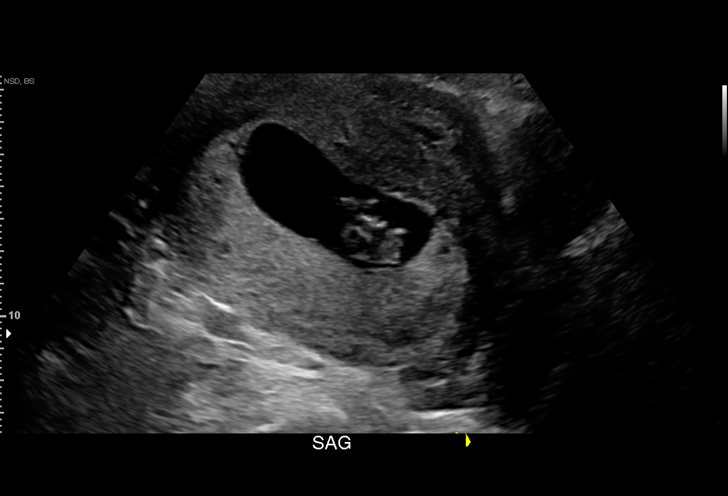
[im 9/28]
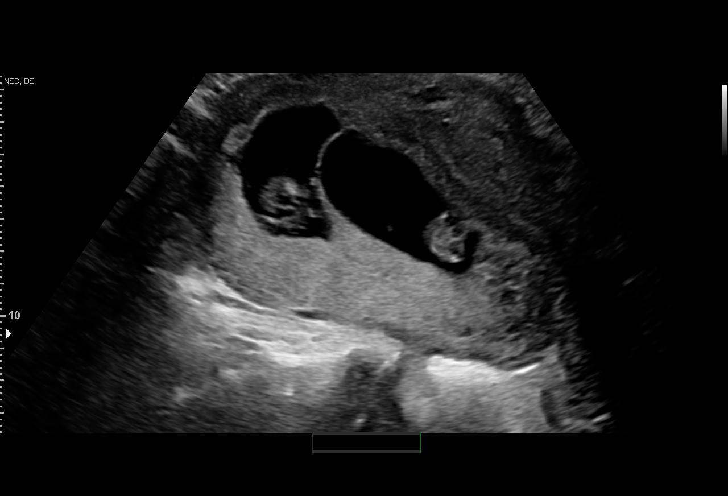
[im 11/28]
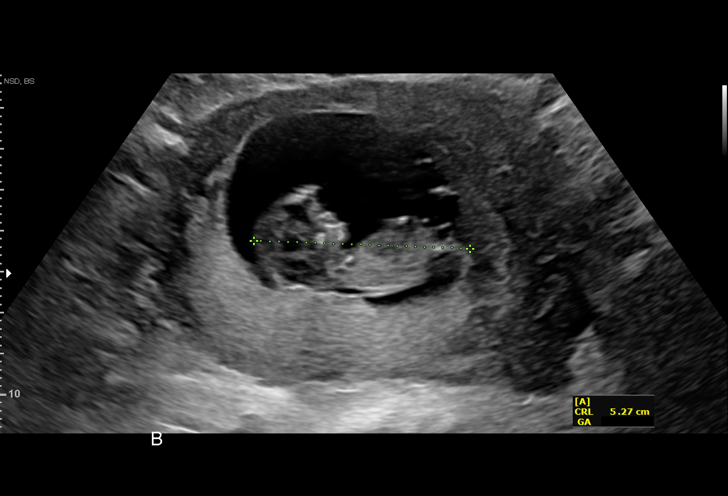
[im 13/28]
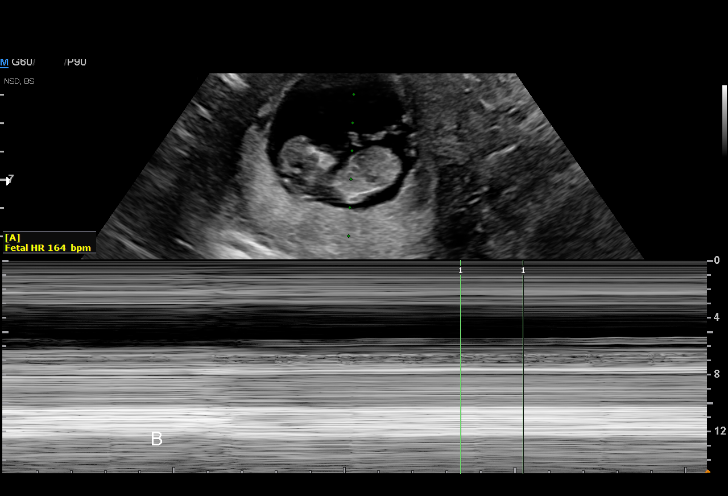
[im 15/28]
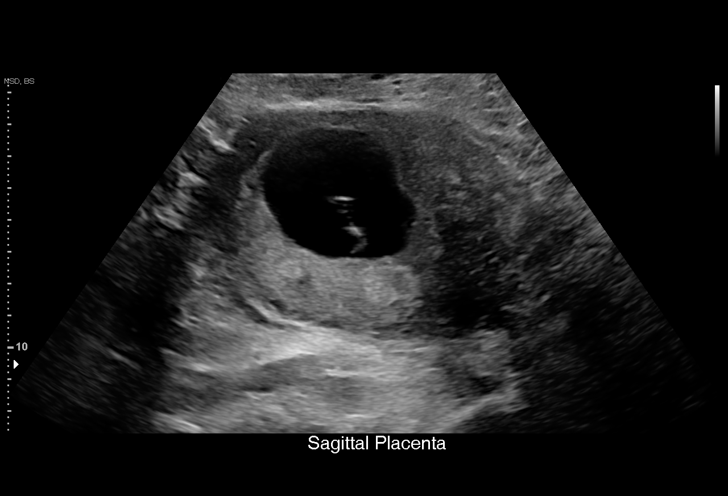
[im 16/28]
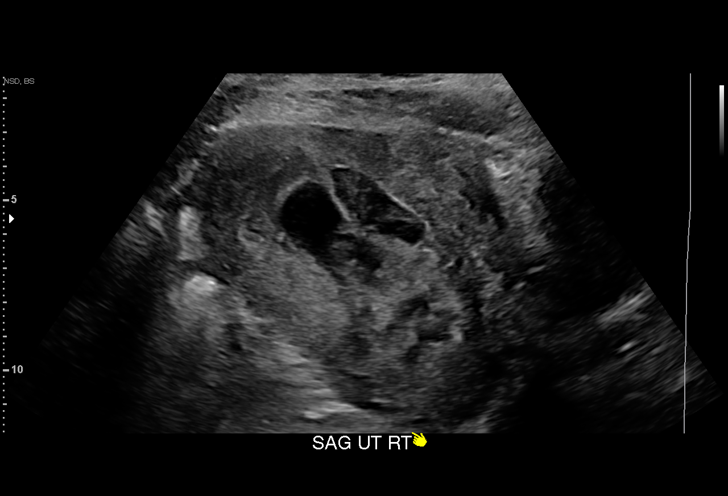
[im 18/28]
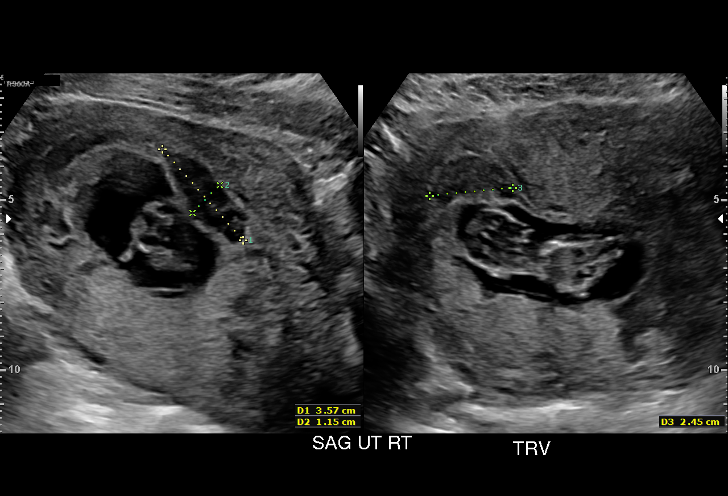
[im 20/28]
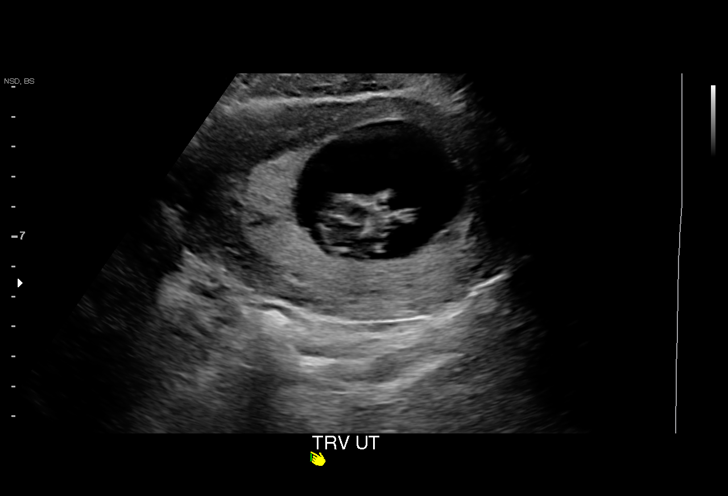
[im 22/28]
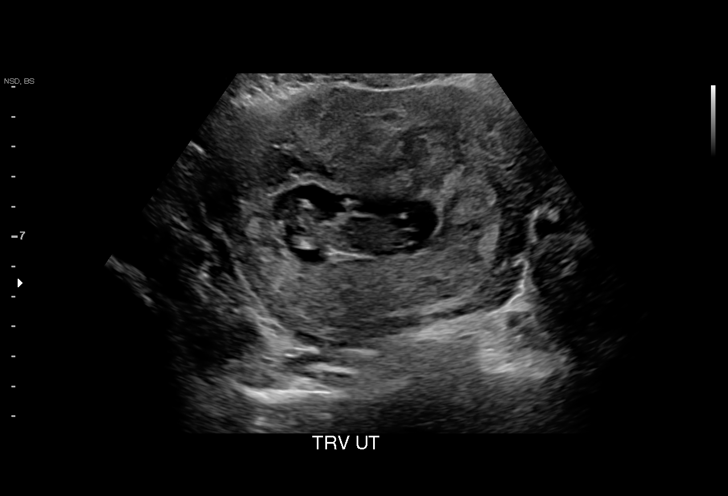
[im 24/28]
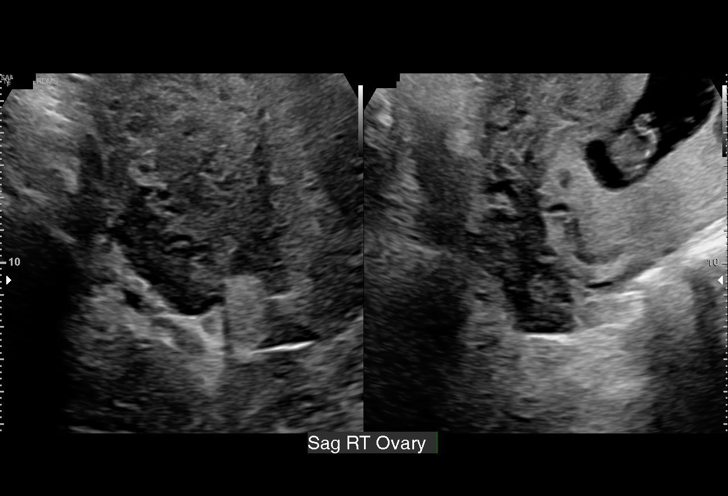
[im 26/28]
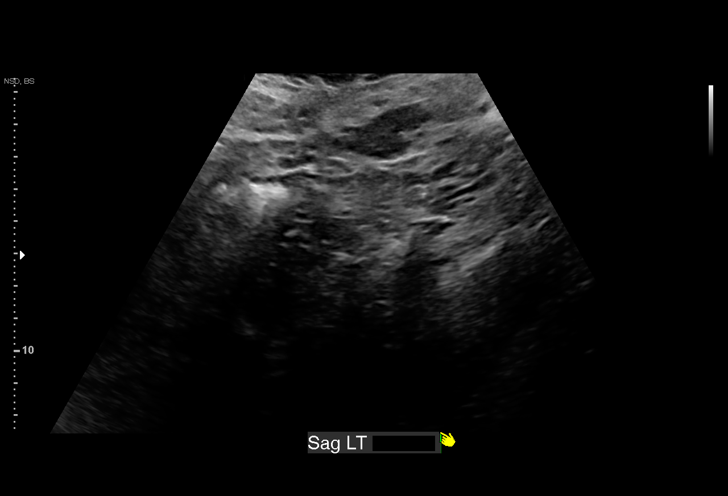
[im 28/28]
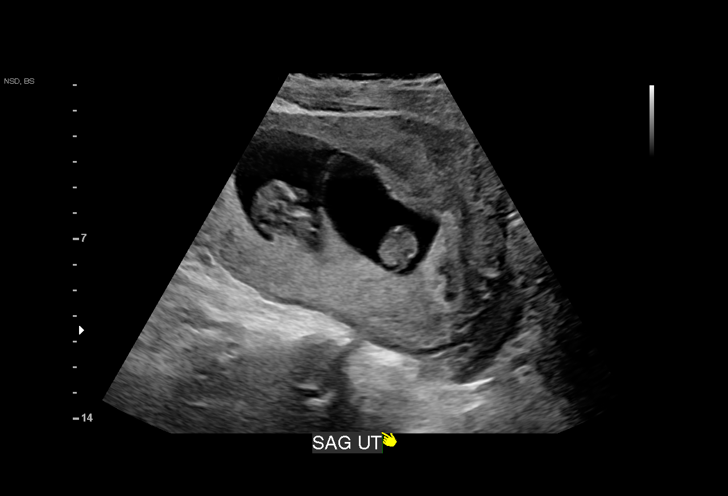

[15 of 28 positions shown; findings below may reference images not displayed]

FINDINGS: Number of IUPs:  2

Chorionicity/Amnionicity:  Dichorionic diamniotic.

TWIN 1

Yolk sac:  None

Embryo:  Present

Cardiac Activity: Present

Heart Rate: 167 bpm

CRL:  52.9  mm   11 w 6 d                  US EDC:

TWIN 2

Yolk sac:  None

Embryo:  Present

Cardiac Activity: Present

Heart Rate: 164 bpm

CRL:  51.5  mm   11 w 5 d                  US EDC:

Subchorionic hemorrhage: Moderate volume of hypoechoic material
adjacent to the gestational sacs, compatible with a moderate
subchorionic hemorrhage. This measures up to 3.6 x 1.2 x 2.5 cm.

Maternal uterus/adnexae: Right ovary is normal in appearance. Left
ovary is not visualized. No significant volume of free fluid in the
cul-de-sac.
IMPRESSION: 1. Dichorionic diamniotic twin pregnancy with 2 viable fetuses with
estimated gestational ages of 11 weeks and 5-6 days and normal fetal
heart rate of 167 and 164 beats per minute.
2. Moderate subchorionic hemorrhage.

## 2018-11-02 IMAGING — US US MFM OB LIMITED
1 series · 15 of 23 positions shown · non-contrast
Comparison: none

[Series 1: us mfm ob limited · 15 of 23 slices shown]
[im 1/23]
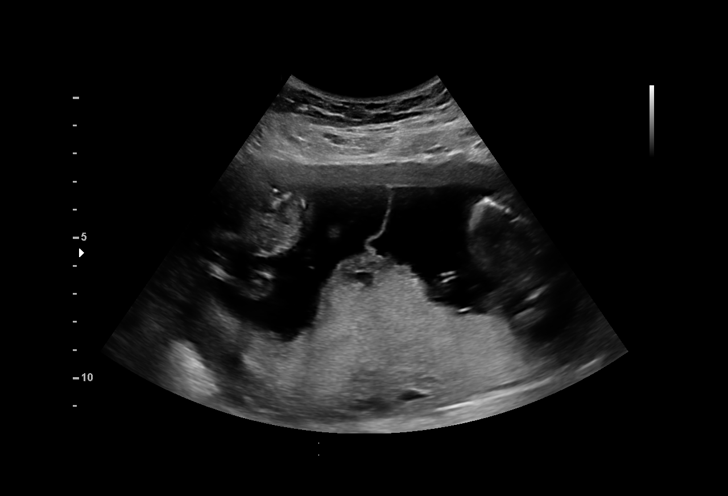
[im 3/23]
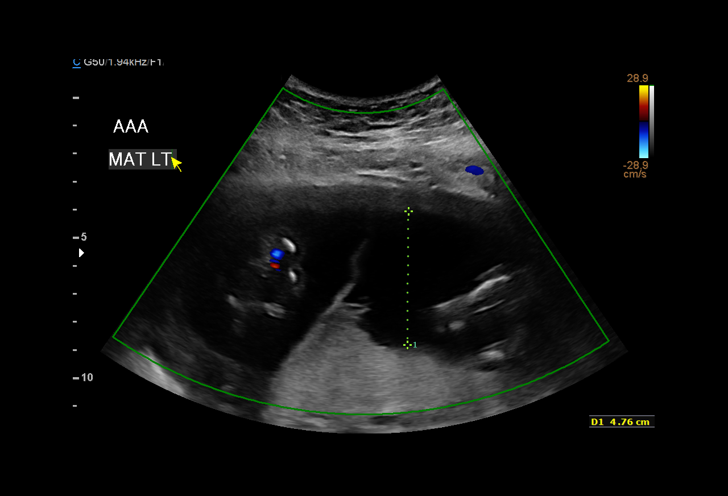
[im 4/23]
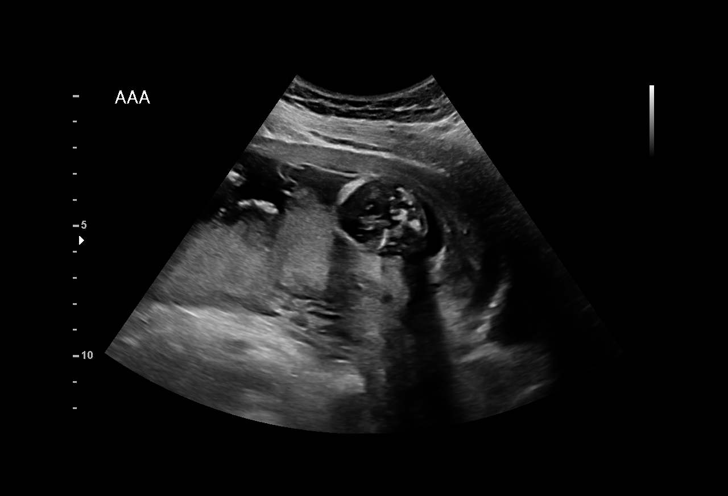
[im 6/23]
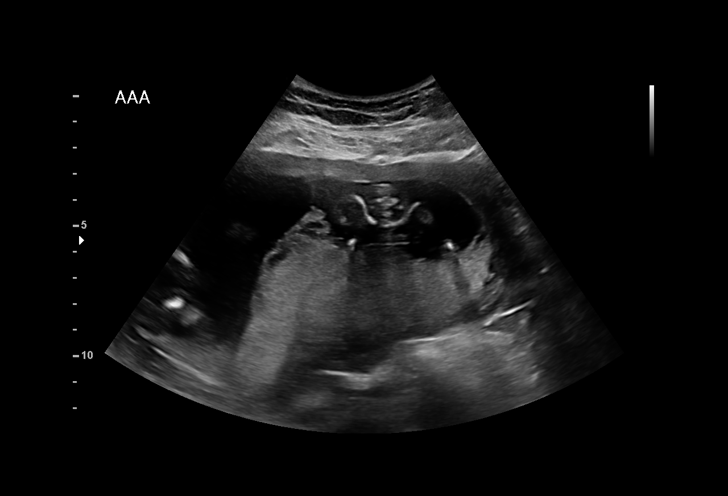
[im 7/23]
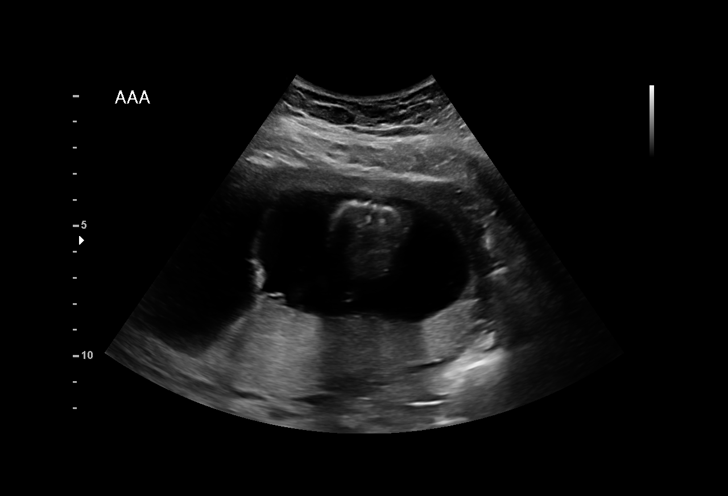
[im 9/23]
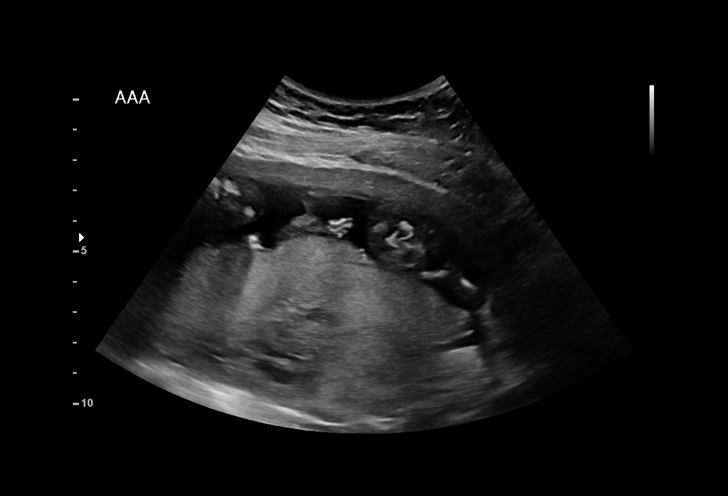
[im 10/23]
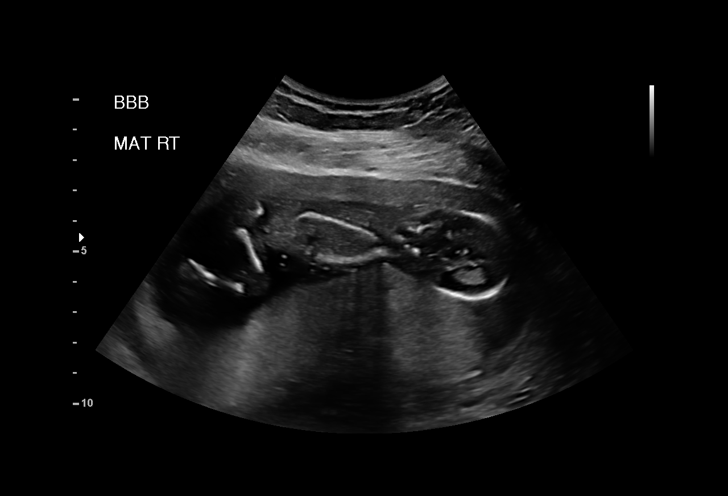
[im 12/23]
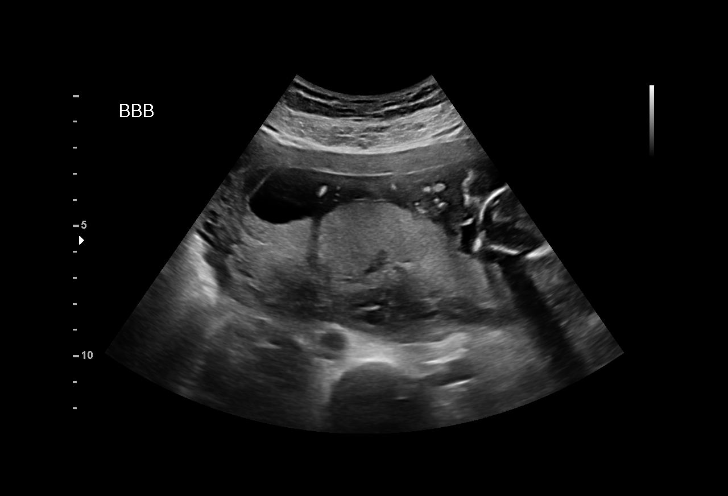
[im 14/23]
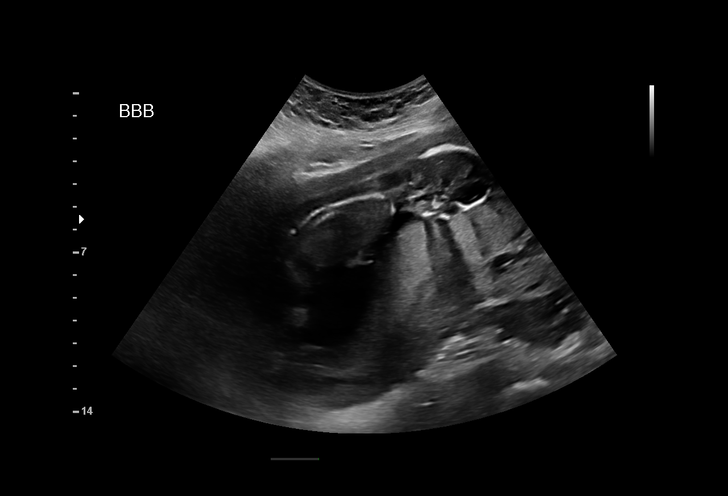
[im 15/23]
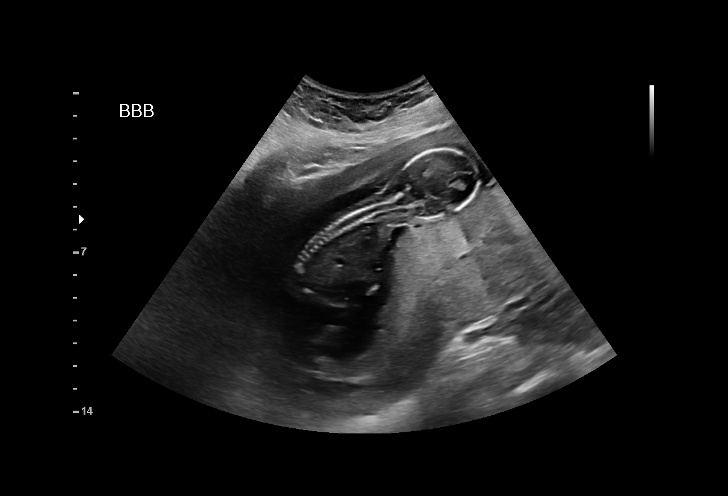
[im 17/23]
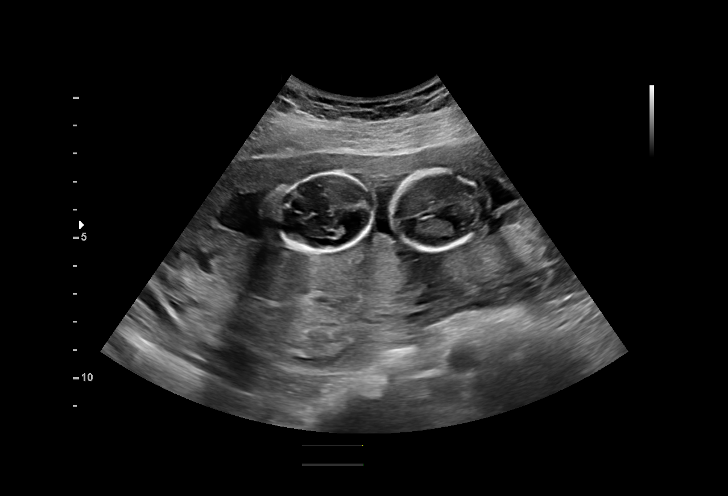
[im 18/23]
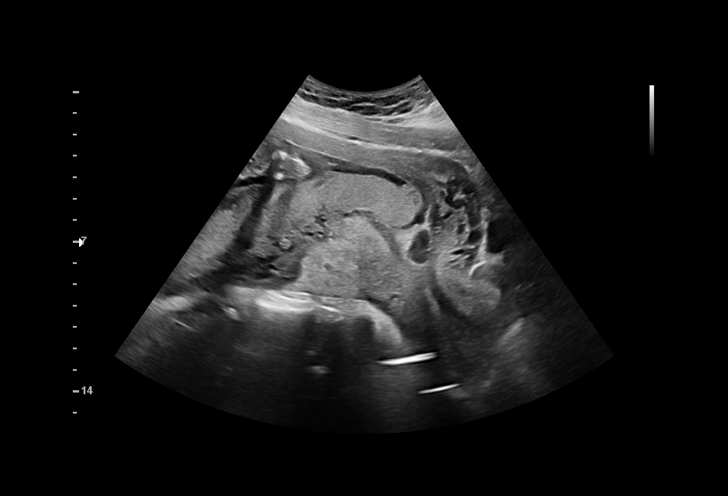
[im 20/23]
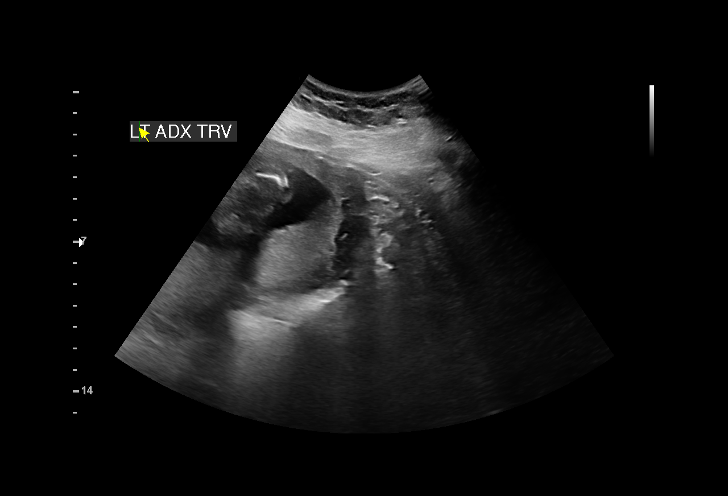
[im 21/23]
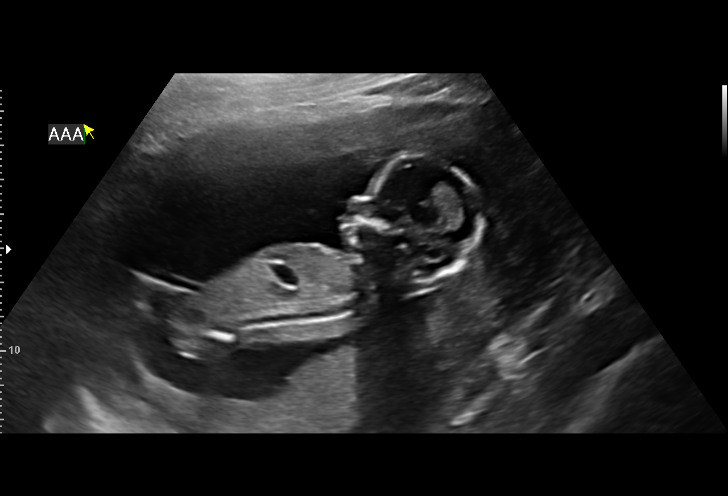
[im 23/23]
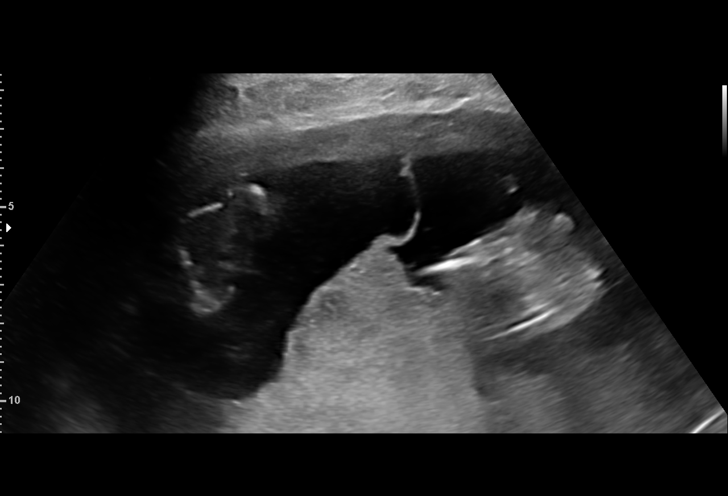

[15 of 23 positions shown; findings below may reference images not displayed]

1  GIANLUCA FAWCETT            [id number]      [id number]     [id number]
Indications

15 weeks gestation of pregnancy
Twin pregnancy, di/di, second trimester
Obesity complicating pregnancy, second
trimester
Gestational diabetes in pregnancy, diet
controlled
Pregnancy resulting from assisted
reproductive technology
OB History

Blood Type:            Height:  5'2"   Weight (lb):  212      BMI:
Gravidity:    1         Term:   0        Prem:   0        SAB:   0
TOP:          0       Ectopic:  0        Living: 0
Fetal Evaluation (Fetus A)

Num Of Fetuses:     2
Fetal Heart         149
Rate(bpm):
Cardiac Activity:   Observed
Presentation:       Cephalic, mat lt
Placenta:           Posterior

Amniotic Fluid
AFI FV:      Subjectively within normal limits

Largest Pocket(cm)
4.76
Gestational Age (Fetus A)
Best:          15w 1d    Det. By:   D.O. Conception          EDD:   08/14/17
(11/21/16)

Fetal Evaluation (Fetus B)

Num Of Fetuses:     2
Fetal Heart         133
Rate(bpm):
Cardiac Activity:   Observed
Presentation:       Cephalic, mat rt
Placenta:           Posterior, above cervical os

Amniotic Fluid
AFI FV:      Subjectively within normal limits

Largest Pocket(cm)
5.16
Gestational Age (Fetus B)

Best:          15w 1d    Det. By:   D.O. Conception          EDD:   08/14/17
(11/21/16)
Impression

Dichorionic diamniotic twin intrauterine pregnancy at 15
weeks 1 day.
Normal amniotic fluid volume x2.
Too early for detailed fetal anatomic assessment.
Recommendations

Recommend follow-up ultrasound examination in 3 weeks for
detailed fetal anatomic survey.

## 2018-11-11 ENCOUNTER — Encounter: Payer: Self-pay | Admitting: Family Medicine

## 2018-12-01 MED FILL — ESCITALOPRAM 10 MG TABLET: 10 | 30 days supply | Qty: 30 | Fill #1

## 2019-01-13 ENCOUNTER — Other Ambulatory Visit: Payer: Self-pay | Admitting: Family Medicine

## 2019-01-13 MED FILL — ESCITALOPRAM 10 MG TABLET: 10 | 30 days supply | Qty: 30 | Fill #2

## 2019-01-13 NOTE — Telephone Encounter (Signed)
Requested medication (s) are due for refill today: yes  Requested medication (s) are on the active medication list: yes  Last refill:  09/03/18  Future visit scheduled: yes 05/27/2019  Notes to clinic:  Last office visit 02/17/18    Requested Prescriptions  Pending Prescriptions Disp Refills   escitalopram (LEXAPRO) 10 MG tablet 30 tablet 3    Sig: Take 1 tablet (10 mg total) by mouth daily.     Psychiatry:  Antidepressants - SSRI Failed - 01/13/2019  9:26 AM      Failed - Valid encounter within last 6 months    Recent Outpatient Visits          10 months ago [place] Midge Minium, MD   11 months ago Physical exam   [facility] [place] [facility]idge Minium, MD   11 months ago Depression, recurrent [facility])   [facility] [place] [facility]idge Minium, MD   1 year ago Depression, recurrent [facility])   [facility] [place]idge Minium, MD   2 years ago Knee swelling, right   [facility] [place], [place]      Future Appointments            In 4 months Tabori, Aundra Millet, MD [facility], [place] - Completed PHQ-2 or PHQ-9 in the last 360 days.

## 2019-01-13 NOTE — Telephone Encounter (Signed)
Copied from [place] [id number]. Topic: Quick Communication - Rx Refill/Question >> Jan 13, 2019  9:19 AM Rayann Heman wrote: Medication: escitalopram (LEXAPRO) 10 MG tablet [[id number]]  would like a 90 days supply/pt states that she has two 95 mo old and its hard to go to the pharmacy.   Has the patient contacted their pharmacy? no Preferred Pharmacy (with phone number or street name):[place], [place]. [phone] (Phone) [phone] (Fax)   Agent: Please be advised that RX refills may take up to 3 business days. We ask that you follow-up with your pharmacy.

## 2019-01-14 ENCOUNTER — Ambulatory Visit: Payer: 59 | Admitting: Psychology

## 2019-01-14 MED ORDER — ESCITALOPRAM OXALATE 10 MG PO TABS: 10.0000 mg | ORAL_TABLET | Freq: Every day | ORAL | 3 refills | Status: DC

## 2019-01-22 ENCOUNTER — Ambulatory Visit (INDEPENDENT_AMBULATORY_CARE_PROVIDER_SITE_OTHER): Payer: 59 | Admitting: Psychology

## 2019-01-22 DIAGNOSIS — F411 Generalized anxiety disorder: Secondary | ICD-10-CM

## 2019-01-26 IMAGING — US USMFM FETAL BPP W/O NON-STRESS ADDL GEST
1 series · 12 of 28 positions shown · non-contrast
Comparison: none

[Series 1: usmfm fetal bpp w/o non-stress addl gest · 64 acquisitions, 12 frames shown]
[im 3/64]
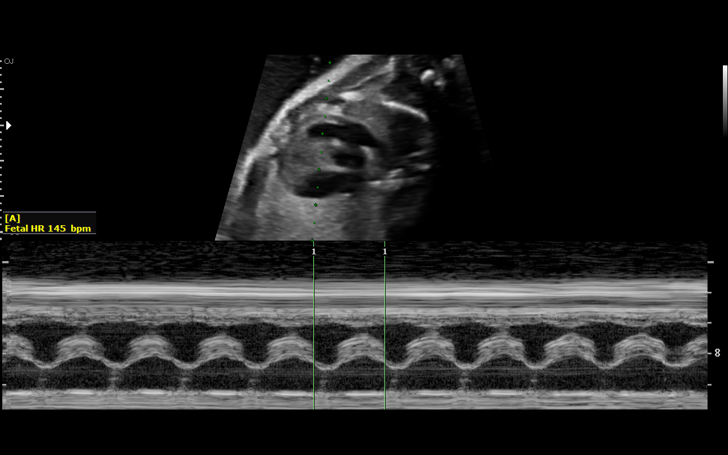
[im 8/64]
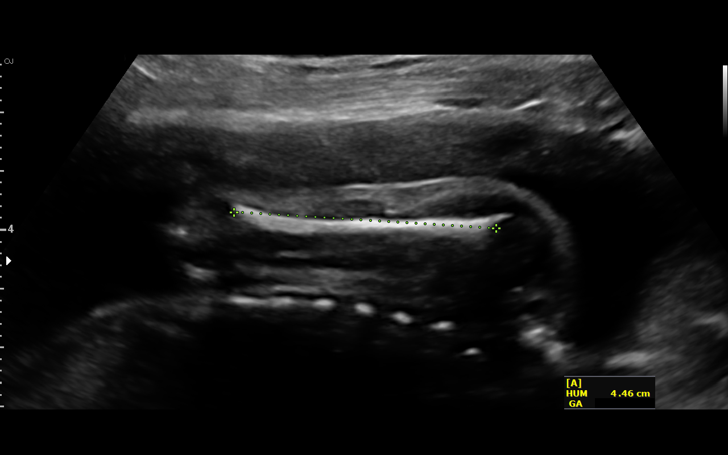
[im 12/64]
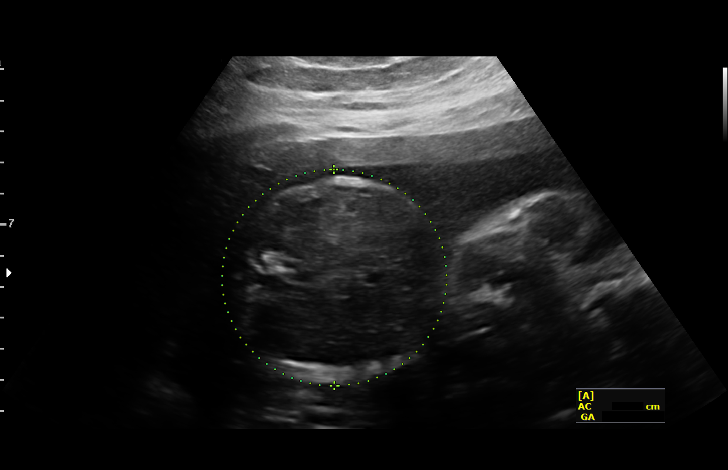
[im 19/64]
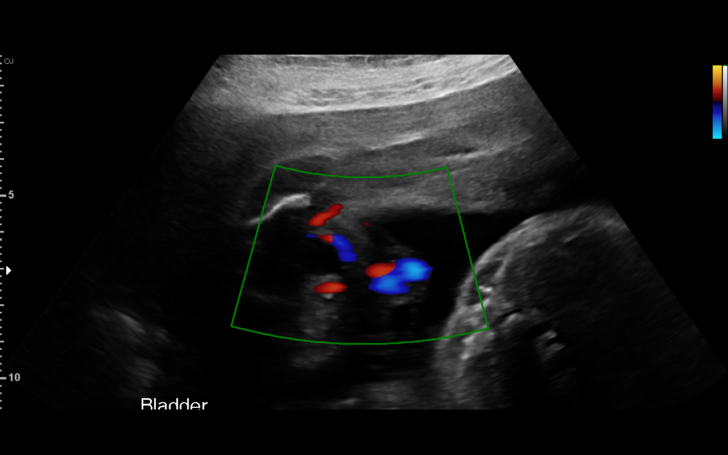
[im 24/64]
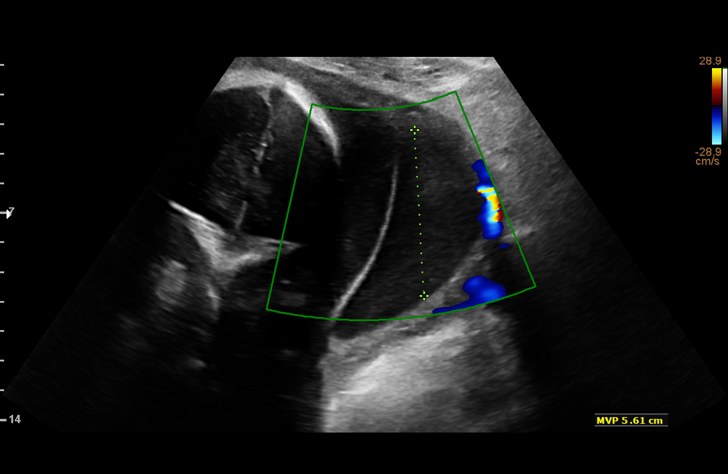
[im 29/64]
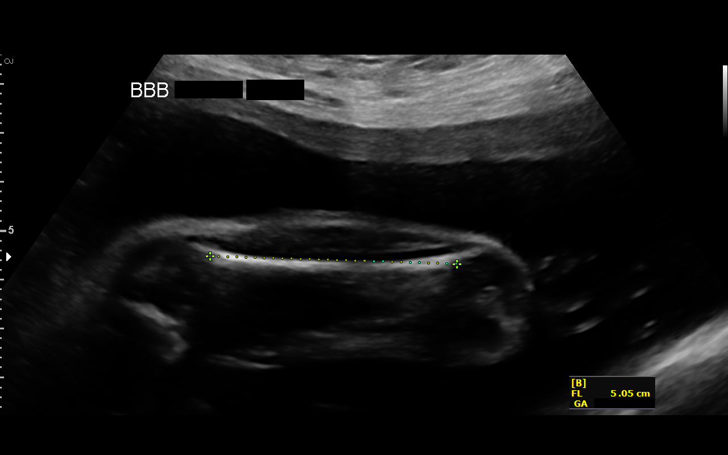
[im 36/64]
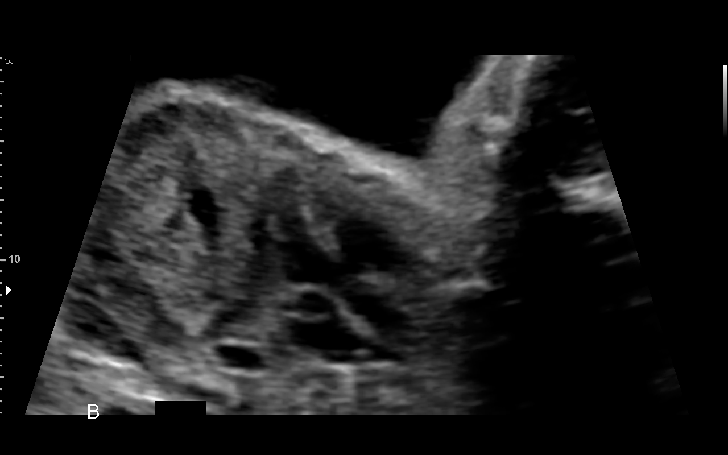
[im 40/64]
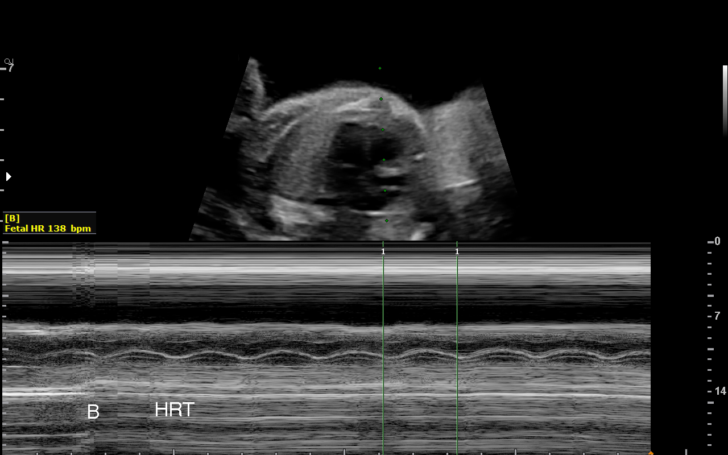
[im 45/64]
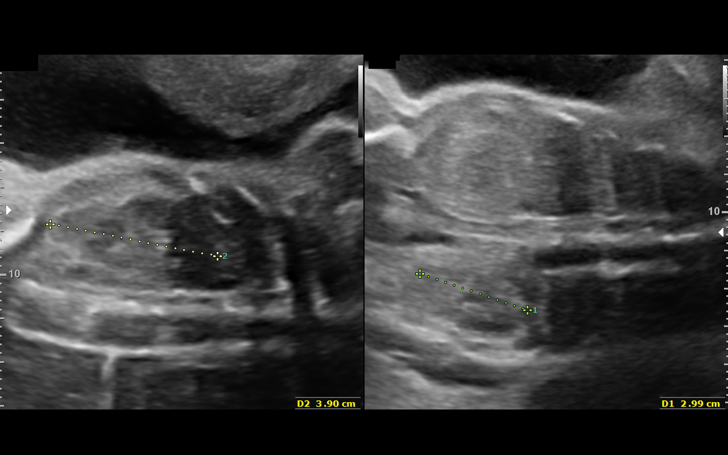
[im 52/64]
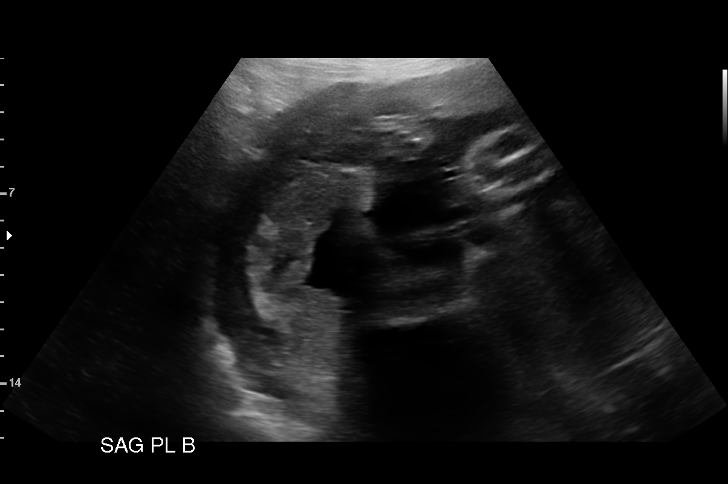
[im 57/64]
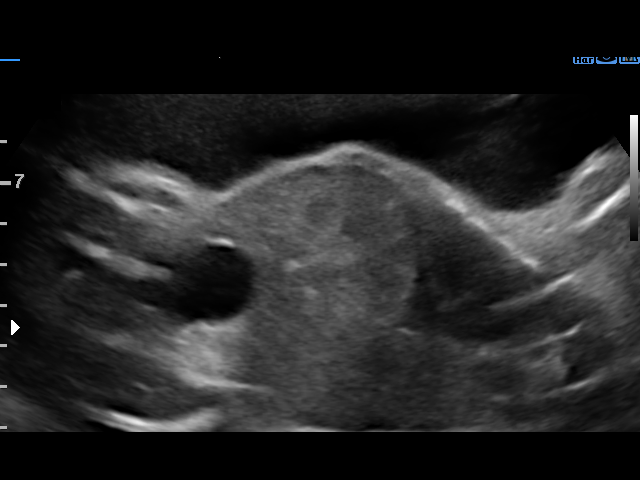
[im 61/64]
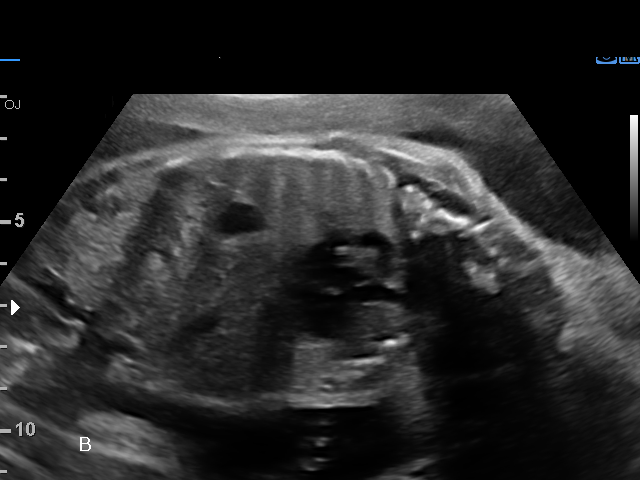

[12 of 28 positions shown; findings below may reference images not displayed]

GESTATION

1  KRISTIA ALBOS              [id number]      [id number]     [id number]
2  KRISTIA ALBOS              [id number]      [id number]     [id number]
3  OTSETSWE RUZANE            [id number]      [id number]     [id number]
4  OTSETSWE RUZANE            [id number]      [id number]     [id number]
Indications

27 weeks gestation of pregnancy
Twin pregnancy, di/di, second trimester
Obesity complicating pregnancy, second
trimester
Gestational diabetes in pregnancy, diet
controlled
Pregnancy resulting from assisted
reproductive technology
OB History

Blood Type:            Height:  5'2"   Weight (lb):  212      BMI:
Gravidity:    1         Term:   0        Prem:   0        SAB:   0
TOP:          0       Ectopic:  0        Living: 0
Fetal Evaluation (Fetus A)

Num Of Fetuses:     2
Fetal Heart         145
Rate(bpm):
Cardiac Activity:   Observed
Fetal Lie:          Lower Fetus
Presentation:       Cephalic
Placenta:           Posterior, above cervical os
P. Cord Insertion:  Visualized, central
Membrane Desc:      Dividing Membrane seen
Amniotic Fluid
AFI FV:      Subjectively within normal limits

Largest Pocket(cm)
5.61
Biophysical Evaluation (Fetus A)

Amniotic F.V:   Within normal limits       F. Tone:        Observed
F. Movement:    Observed                   Score:          [DATE]
F. Breathing:   Observed
Biometry (Fetus A)

BPD:      67.6  mm     G. Age:  27w 2d         36  %    CI:        74.34   %   70 - 86
FL/HC:      19.2   %   18.6 -
HC:      248.9  mm     G. Age:  27w 0d         17  %    HC/AC:      1.13       1.05 -
AC:       221   mm     G. Age:  26w 4d         21  %    FL/BPD:     70.9   %   71 - 87
FL:       47.9  mm     G. Age:  26w 0d          9  %    FL/AC:      21.7   %   20 - 24
HUM:      45.2  mm     G. Age:  26w 5d         35  %

Est. FW:     937  gm      2 lb 1 oz     34  %     FW Discordancy        23  %
Gestational Age (Fetus A)

U/S Today:     26w 5d                                        EDD:   08/18/17
Best:          27w 2d    Det. By:   D.O. Conception          EDD:   08/14/17
(11/21/16)
Anatomy (Fetus A)

Cranium:               Appears normal         Aortic Arch:            Previously seen
Cavum:                 Appears normal         Ductal Arch:            Previously seen
Ventricles:            Appears normal         Diaphragm:              Appears normal
Choroid Plexus:        Previously seen        Stomach:                Appears normal, left
sided
Cerebellum:            Previously seen        Abdomen:                Appears normal
Posterior Fossa:       Previously seen        Abdominal Wall:         Previously seen
Nuchal Fold:           Previously seen        Cord Vessels:           Appears normal (3
vessel cord)
Face:                  Appears normal         Kidneys:                Appear normal
(orbits and profile)
Lips:                  Previously seen        Bladder:                Appears normal
Thoracic:              Appears normal         Spine:                  Appears normal
Heart:                 Previously seen        Upper Extremities:      Previously seen
RVOT:                  Appears normal         Lower Extremities:      Previously seen
LVOT:                  Previously seen

Other:  Heels previously seen.  Technically difficult due to maternal habitus
and fetal position.

Fetal Evaluation (Fetus B)

Num Of Fetuses:     2
Fetal Heart         138
Rate(bpm):
Cardiac Activity:   Observed
Fetal Lie:          Upper Fetus
Presentation:       Cephalic
Placenta:           Posterior, above cervical os
P. Cord Insertion:  Visualized, central
Membrane Desc:      Dividing Membrane seen - Dichorionic.

Amniotic Fluid
AFI FV:      Subjectively within normal limits

Largest Pocket(cm)
6.13
Biophysical Evaluation (Fetus B)

Amniotic F.V:   Within normal limits       F. Tone:        Observed
F. Movement:    Observed                   Score:          [DATE]
F. Breathing:   Observed
Biometry (Fetus B)

BPD:        70  mm     G. Age:  28w 1d         67  %    CI:         78.5   %   70 - 86
FL/HC:      20.6   %   18.6 -
HC:      249.9  mm     G. Age:  27w 1d         19  %    HC/AC:      1.00       1.05 -
AC:      250.1  mm     G. Age:  29w 1d         91  %    FL/BPD:     73.4   %   71 - 87
FL:       51.4  mm     G. Age:  27w 3d         41  %    FL/AC:      20.6   %   20 - 24

Est. FW:    8089  gm    2 lb 11 oz      75  %     FW Discordancy     0 \ 23 %
Gestational Age (Fetus B)

U/S Today:     28w 0d                                        EDD:   08/09/17
Best:          27w 2d    Det. By:   D.O. Conception          EDD:   08/14/17
(11/21/16)
Anatomy (Fetus B)

Cranium:               Appears normal         Aortic Arch:            Previously seen
Cavum:                 Appears normal         Ductal Arch:            Previously seen
Ventricles:            Previously seen        Diaphragm:              Appears normal
Choroid Plexus:        Previously seen        Stomach:                Appears normal, left
sided
Cerebellum:            Previously seen        Abdomen:                Appears normal
Posterior Fossa:       Previously seen        Abdominal Wall:         Previously seen
Nuchal Fold:           Previously seen        Cord Vessels:           Appears normal (3
vessel cord)
Face:                  Orbits and profile     Kidneys:                Appear normal
previously seen
Lips:                  Previously seen        Bladder:                Appears normal
Thoracic:              Appears normal         Spine:                  Appears normal
Heart:                 Appears normal         Upper Extremities:      Previously seen
(4CH, axis, and situs
RVOT:                  Previously seen        Lower Extremities:      Previously seen
LVOT:                  Appears normal

Other:  5th digit previously seen.Technically difficult due to maternal habitus
and fetal position.
Cervix Uterus Adnexa

Cervix
Length:            3.6  cm.
Normal appearance by transabdominal scan.
Uterus
No abnormality visualized.

Left Ovary
Not visualized. No adnexal mass visualized.

Right Ovary
Not visualized. No adnexal mass visualized.
Comments

The patient's blood pressures today were 144/97 and 143/99.
She reports she is being followed for elevated blood
pressures and labs have been within normal limits. She
denied signs or symptoms of preeclampsia today.
Precautions reviewed.
Impression

DA/DC twin gestation at 27w 2d

Twin A:
Lower, Cephalic, posterior placenta
Normal amniotic fluid volume
Fetal growth is appropriate (34th %tile)
Normal interval anatomy

Twin B:
Upper, Cephalic, posterior placenta
Normal amniotic fluid volume
Fetal growth is appropriate (75th %tile)
The anatomic survey is complete.
Normal fetal anatomy.

Growth discordance 23%
BPP [DATE] x 2
Recommendations

Recommend serial US for growth.

## 2019-02-02 IMAGING — US US MFM FETAL BPP W/O NON-STRESS
1 series · 12 of 24 positions shown · non-contrast
Comparison: none

[Series 1: us mfm fetal bpp w/o non-stress · 24 acquisitions, 12 frames shown]
[im 2/24]
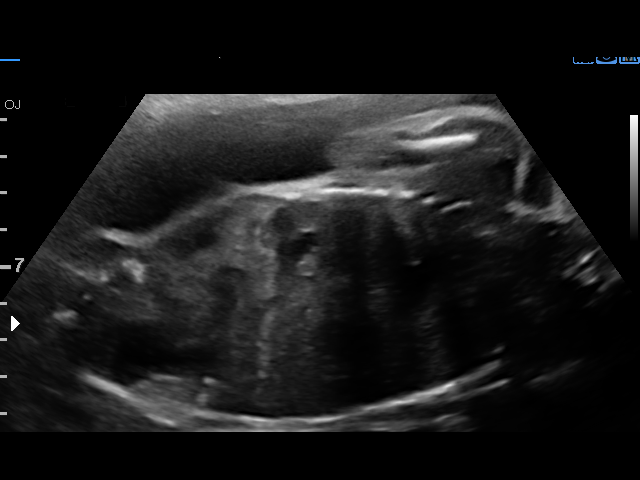
[im 4/24]
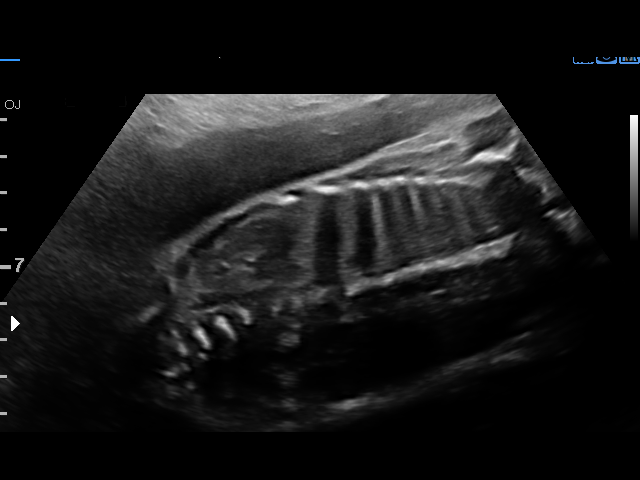
[im 6/24]
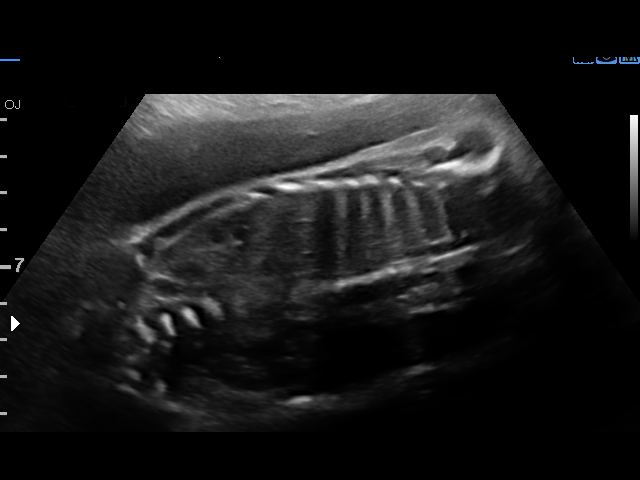
[im 8/24]
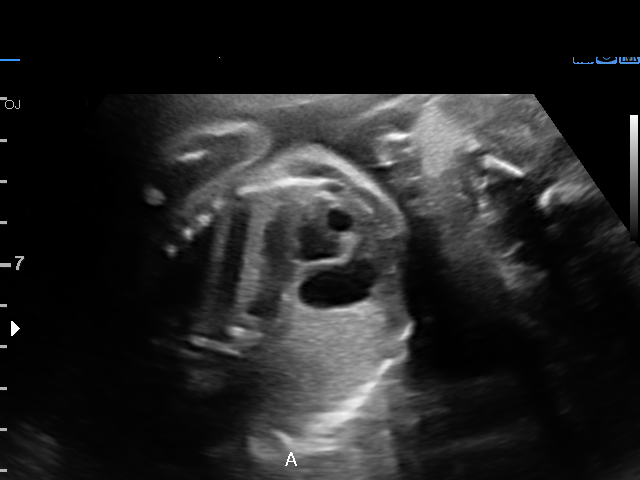
[im 10/24]
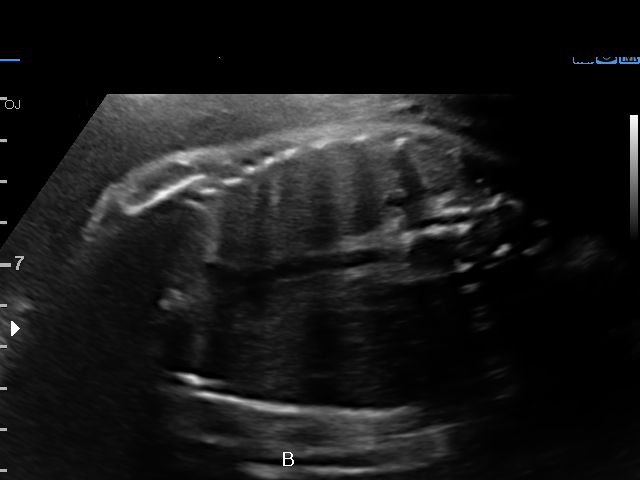
[im 12/24]
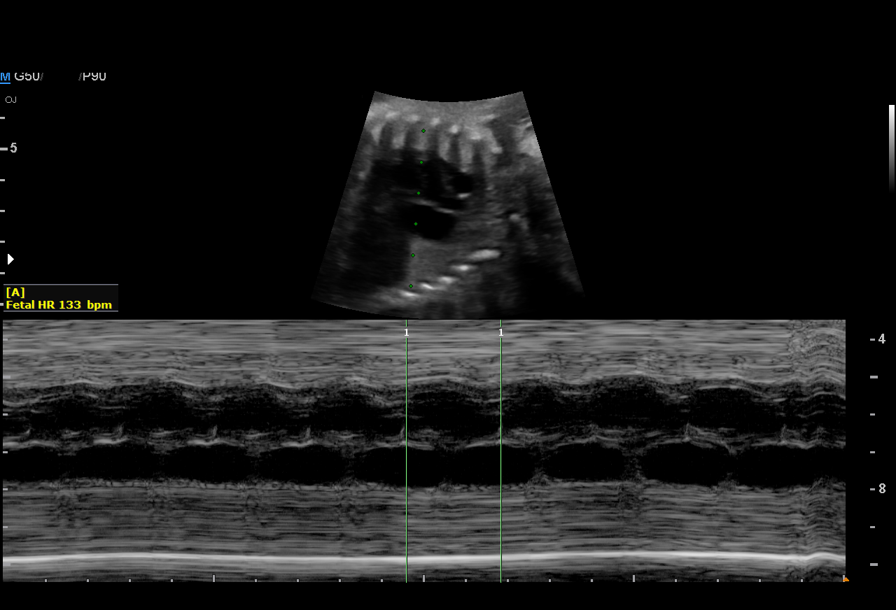
[im 14/24]
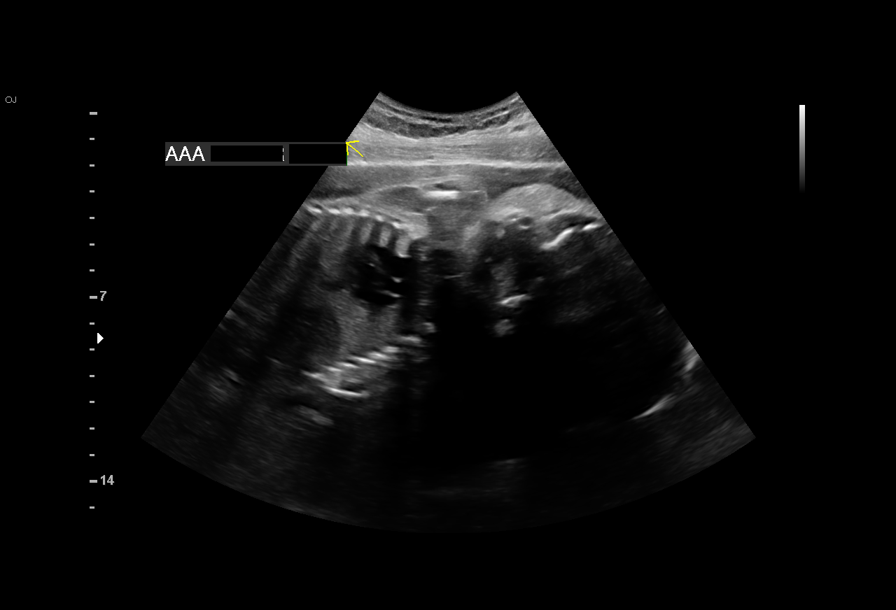
[im 16/24]
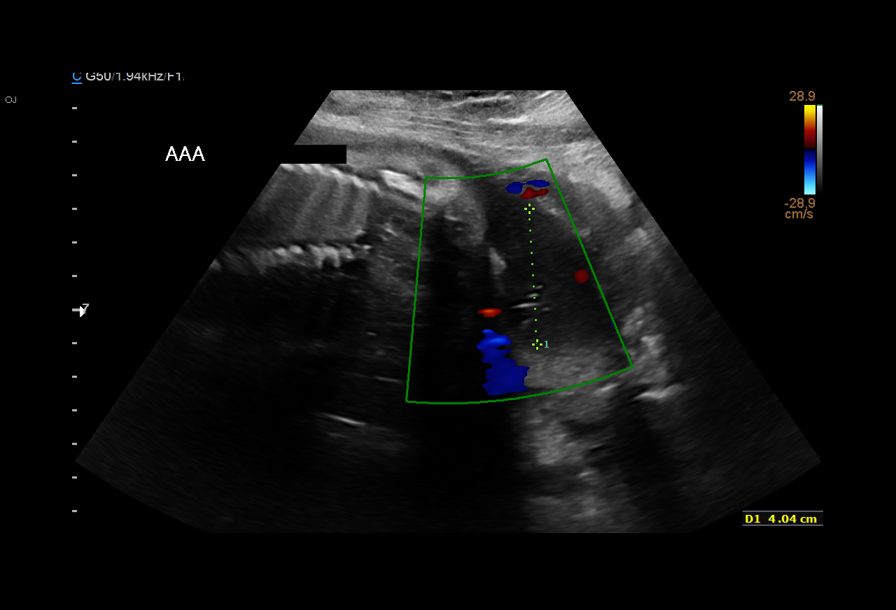
[im 18/24]
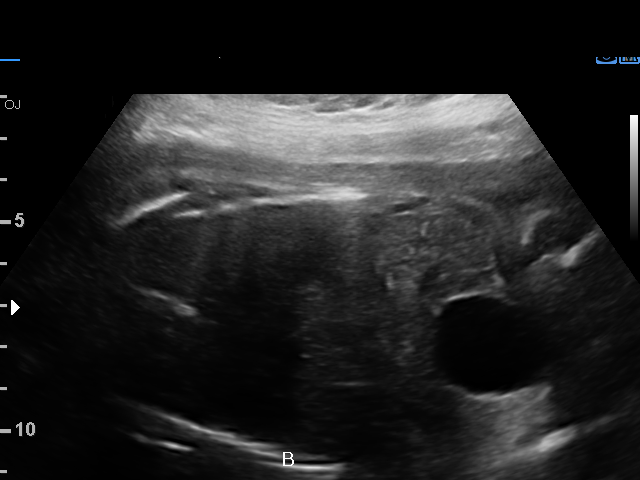
[im 20/24]
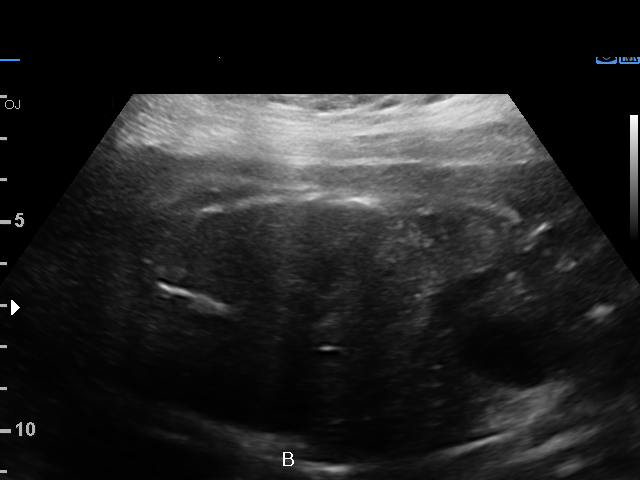
[im 22/24]
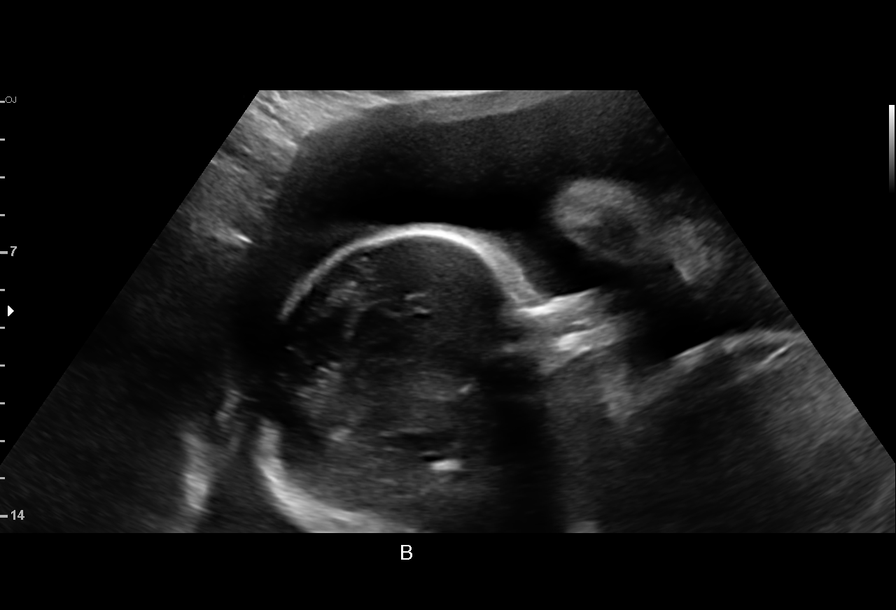
[im 24/24]
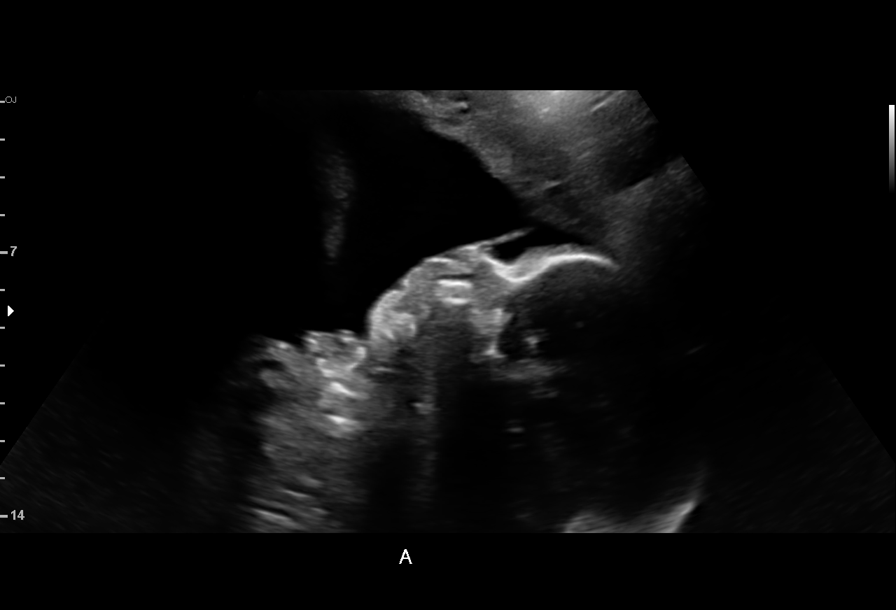

[12 of 24 positions shown; findings below may reference images not displayed]

GESTATION

1  MYRTES NESTOR            [id number]      [id number]     [id number]
2  MYRTES NESTOR            [id number]      [id number]     [id number]
Indications

28 weeks gestation of pregnancy
Gestational diabetes in pregnancy, diet
controlled
Pregnancy resulting from assisted
reproductive technology
Twin pregnancy, di/di, third trimester
Hypertension - Gestational vs chronic
OB History

Blood Type:            Height:  5'2"   Weight (lb):  212       BMI:
Gravidity:    1         Term:   0        Prem:   0         SAB:   0
TOP:          0       Ectopic:  0        Living: 0
Fetal Evaluation (Fetus A)

Num Of Fetuses:     2
Fetal Heart         133
Rate(bpm):
Cardiac Activity:   Observed
Fetal Lie:          Lower Fetus
Presentation:       Cephalic

Amniotic Fluid
AFI FV:      Subjectively within normal limits

Largest Pocket(cm)
4.37
Biophysical Evaluation (Fetus A)

Amniotic F.V:   Within normal limits       F. Tone:         Observed
F. Movement:    Observed                   Score:           [DATE]
F. Breathing:   Observed
Gestational Age (Fetus A)

Best:          28w 2d     Det. By:  D.O. Conception          EDD:    08/14/17
(11/21/16)

Fetal Evaluation (Fetus B)

Num Of Fetuses:     2
Fetal Heart         141
Rate(bpm):
Cardiac Activity:   Observed
Fetal Lie:          Upper Fetus

Amniotic Fluid
AFI FV:      Subjectively within normal limits

Largest Pocket(cm)
6.04
Biophysical Evaluation (Fetus B)

Amniotic F.V:   Within normal limits       F. Tone:         Observed
F. Movement:    Observed                   Score:           [DATE]
F. Breathing:   Observed
Gestational Age (Fetus B)

Best:          28w 2d     Det. By:  D.O. Conception          EDD:    08/14/17
(11/21/16)
Impression

Dichorionic/diamniotic twin pregnancy at 28+2 weeks
Normal amniotic fluid volume x 2
BPP [DATE] x 2
Recommendations

Continue weekly testing for gestational/chronic hypertension

## 2019-02-04 ENCOUNTER — Ambulatory Visit: Payer: 59 | Admitting: Psychology

## 2019-02-17 MED ORDER — ESCITALOPRAM OXALATE 10 MG PO TABS: 10.0000 mg | ORAL_TABLET | Freq: Every day | ORAL | 0 refills | Status: DC

## 2019-02-17 MED FILL — ESCITALOPRAM 10 MG TABLET: 10 | 90 days supply | Qty: 90 | Fill #0

## 2019-02-17 NOTE — Addendum Note (Signed)
Addended by: Davis Gourd on: 02/17/2019 10:13 AM   Modules accepted: Orders

## 2019-02-17 NOTE — Telephone Encounter (Signed)
Pt last seen 03/18/18 lexapro last filled 01/14/19 #30 with 3  Ok to send in #90

## 2019-02-17 NOTE — Telephone Encounter (Signed)
Pt called stating she is out of medication. She called pharmacy to refill 90 day supply as requested below and was advised no 90 day RX on file. Pt would like to get 90 day if possible.  [place] please

## 2019-02-19 ENCOUNTER — Ambulatory Visit: Payer: 59 | Admitting: Psychology

## 2019-02-26 ENCOUNTER — Ambulatory Visit: Payer: 59 | Admitting: Psychology

## 2019-04-02 ENCOUNTER — Ambulatory Visit (INDEPENDENT_AMBULATORY_CARE_PROVIDER_SITE_OTHER): Payer: 59 | Admitting: Psychology

## 2019-04-02 DIAGNOSIS — F411 Generalized anxiety disorder: Secondary | ICD-10-CM | POA: Diagnosis not present

## 2019-05-07 ENCOUNTER — Ambulatory Visit (INDEPENDENT_AMBULATORY_CARE_PROVIDER_SITE_OTHER): Payer: 59 | Admitting: Psychology

## 2019-05-07 DIAGNOSIS — F411 Generalized anxiety disorder: Secondary | ICD-10-CM | POA: Diagnosis not present

## 2019-05-27 ENCOUNTER — Encounter: Payer: Self-pay | Admitting: Family Medicine

## 2019-06-01 ENCOUNTER — Other Ambulatory Visit: Payer: Self-pay | Admitting: Family Medicine

## 2019-06-02 ENCOUNTER — Telehealth: Payer: Self-pay | Admitting: *Deleted

## 2019-06-02 MED ORDER — ESCITALOPRAM OXALATE 10 MG PO TABS: 10.0000 mg | ORAL_TABLET | Freq: Every day | ORAL | 0 refills | Status: DC

## 2019-06-02 MED FILL — ESCITALOPRAM 10 MG TABLET: 10 | 90 days supply | Qty: 90 | Fill #0

## 2019-06-02 NOTE — Telephone Encounter (Signed)
Medication filled to pharmacy as requested.   

## 2019-06-02 NOTE — Telephone Encounter (Signed)
Ok to fill Lexapro for #90 to get her to her CPE

## 2019-06-02 NOTE — Addendum Note (Signed)
Addended by: Davis Gourd on: 06/02/2019 11:14 AM   Modules accepted: Orders

## 2019-06-02 NOTE — Telephone Encounter (Signed)
Received a VM from patient stating she called her pharmacy to find out why her medication was not ready and she was told that she needed an appointment.  Patient has an appointment scheduled for September for her CPE, and she's wondering if she can get a refill on the medication until then, or if she needed to try to reschedule her CPE.     Medication being requested:  Escitalopram 10mg  to the [place] to PCP to advise.

## 2019-06-18 ENCOUNTER — Ambulatory Visit (INDEPENDENT_AMBULATORY_CARE_PROVIDER_SITE_OTHER): Payer: 59 | Admitting: Psychology

## 2019-06-18 DIAGNOSIS — F411 Generalized anxiety disorder: Secondary | ICD-10-CM | POA: Diagnosis not present

## 2019-07-23 ENCOUNTER — Ambulatory Visit: Payer: 59 | Admitting: Psychology

## 2019-08-12 ENCOUNTER — Other Ambulatory Visit: Payer: Self-pay

## 2019-08-12 ENCOUNTER — Ambulatory Visit (INDEPENDENT_AMBULATORY_CARE_PROVIDER_SITE_OTHER): Payer: 59 | Admitting: Family Medicine

## 2019-08-12 ENCOUNTER — Encounter: Payer: Self-pay | Admitting: Family Medicine

## 2019-08-12 VITALS — BP 121/81 | HR 78 | Temp 97.9°F | Resp 16 | Ht 62.0 in | Wt 212.4 lb

## 2019-08-12 DIAGNOSIS — E559 Vitamin D deficiency, unspecified: Secondary | ICD-10-CM

## 2019-08-12 DIAGNOSIS — E669 Obesity, unspecified: Secondary | ICD-10-CM

## 2019-08-12 DIAGNOSIS — Z23 Encounter for immunization: Secondary | ICD-10-CM | POA: Diagnosis not present

## 2019-08-12 DIAGNOSIS — Z0000 Encounter for general adult medical examination without abnormal findings: Secondary | ICD-10-CM

## 2019-08-12 LAB — BASIC METABOLIC PANEL
BUN: 17 mg/dL (ref 6–23)
CO2: 27 mEq/L (ref 19–32)
Calcium: 8.9 mg/dL (ref 8.4–10.5)
Chloride: 105 mEq/L (ref 96–112)
Creatinine, Ser: 0.66 mg/dL (ref 0.40–1.20)
GFR: 99.75 mL/min (ref >60.00)
Glucose, Bld: 84 mg/dL (ref 70–99)
Potassium: 4.2 mEq/L (ref 3.5–5.1)
Sodium: 138 mEq/L (ref 135–145)

## 2019-08-12 LAB — CBC WITH DIFFERENTIAL/PLATELET
Basophils Absolute: 0 10*3/uL (ref 0.0–0.1)
Basophils Relative: 0.3 % (ref 0.0–3.0)
Eosinophils Absolute: 0.1 10*3/uL (ref 0.0–0.7)
Eosinophils Relative: 2 % (ref 0.0–5.0)
HCT: 33.8 % — ABNORMAL LOW (ref 36.0–46.0)
Hemoglobin: 10.8 g/dL — ABNORMAL LOW (ref 12.0–15.0)
Lymphocytes Relative: 32.2 % (ref 12.0–46.0)
Lymphs Abs: 2.1 10*3/uL (ref 0.7–4.0)
MCHC: 32 g/dL (ref 30.0–36.0)
MCV: 77.7 fl — ABNORMAL LOW (ref 78.0–100.0)
Monocytes Absolute: 0.4 10*3/uL (ref 0.1–1.0)
Monocytes Relative: 6.4 % (ref 3.0–12.0)
Neutro Abs: 3.9 10*3/uL (ref 1.4–7.7)
Neutrophils Relative %: 59.1 % (ref 43.0–77.0)
Platelets: 346 10*3/uL (ref 150.0–400.0)
RBC: 4.35 Mil/uL (ref 3.87–5.11)
RDW: 15.7 % — ABNORMAL HIGH (ref 11.5–15.5)
WBC: 6.7 10*3/uL (ref 4.0–10.5)

## 2019-08-12 LAB — LIPID PANEL
Cholesterol: 214 mg/dL — ABNORMAL HIGH (ref 0–200)
HDL: 43.1 mg/dL (ref >39.00)
LDL Cholesterol: 157 mg/dL — ABNORMAL HIGH (ref 0–99)
NonHDL: 171.06
Total CHOL/HDL Ratio: 5
Triglycerides: 72 mg/dL (ref 0.0–149.0)
VLDL: 14.4 mg/dL (ref 0.0–40.0)

## 2019-08-12 LAB — HEPATIC FUNCTION PANEL
ALT: 10 U/L (ref 0–35)
AST: 15 U/L (ref 0–37)
Albumin: 4.1 g/dL (ref 3.5–5.2)
Alkaline Phosphatase: 56 U/L (ref 39–117)
Bilirubin, Direct: 0.1 mg/dL (ref 0.0–0.3)
Total Bilirubin: 0.4 mg/dL (ref 0.2–1.2)
Total Protein: 6.8 g/dL (ref 6.0–8.3)

## 2019-08-12 LAB — TSH: TSH: 1.35 u[IU]/mL (ref 0.35–4.50)

## 2019-08-12 LAB — VITAMIN D 25 HYDROXY (VIT D DEFICIENCY, FRACTURES): VITD: 11.09 ng/mL — ABNORMAL LOW (ref 30.00–100.00)

## 2019-08-12 MED ORDER — NORETHIN ACE-ETH ESTRAD-FE 1-20 MG-MCG PO TABS: 1.0000 | ORAL_TABLET | Freq: Every day | ORAL | 4 refills | Status: DC

## 2019-08-12 NOTE — Progress Notes (Signed)
   Subjective:    Patient ID: Cynthia [name], female    DOB: [birth year], 38 y.o.   MRN: [id number]  HPI CPE- UTD on pap, too young for mammo.  UTD on Tdap.  Due for flu today.  Is down 10 lbs since last visit.  Pt is doing Keto diet for last month.   Review of Systems Patient reports no vision/ hearing changes, adenopathy,fever,  persistant/recurrent hoarseness , swallowing issues, chest pain, palpitations, edema, persistant/recurrent cough, hemoptysis, dyspnea (rest/exertional/paroxysmal nocturnal), gastrointestinal bleeding (melena, rectal bleeding), abdominal pain, significant heartburn, bowel changes, GU symptoms (dysuria, hematuria, incontinence), Gyn symptoms (abnormal  bleeding, pain),  syncope, focal weakness, memory loss, numbness & tingling, skin/hair/nail changes, abnormal bruising or bleeding, anxiety, or depression.     Objective:   Physical Exam General Appearance:    Alert, cooperative, no distress, appears stated age, obese  Head:    Normocephalic, without obvious abnormality, atraumatic  Eyes:    PERRL, conjunctiva/corneas clear, EOM's intact, fundi    benign, both eyes  Ears:    Normal TM's and external ear canals, both ears  Nose:   Nares normal, septum midline, mucosa normal, no drainage    or sinus tenderness  Throat:   Lips, mucosa, and tongue normal; teeth and gums normal  Neck:   Supple, symmetrical, trachea midline, no adenopathy;    Thyroid: no enlargement/tenderness/nodules  Back:     Symmetric, no curvature, ROM normal, no CVA tenderness  Lungs:     Clear to auscultation bilaterally, respirations unlabored  Chest Wall:    No tenderness or deformity   Heart:    Regular rate and rhythm, S1 and S2 normal, no murmur, rub   or gallop  Breast Exam:    Deferred to GYN  Abdomen:     Soft, non-tender, bowel sounds active all four quadrants,    no masses, no organomegaly  Genitalia:    Deferred to GYN  Rectal:    Extremities:   Extremities normal, atraumatic, no  cyanosis or edema  Pulses:   2+ and symmetric all extremities  Skin:   Skin color, texture, turgor normal, no rashes or lesions  Lymph nodes:   Cervical, supraclavicular, and axillary nodes normal  Neurologic:   CNII-XII intact, normal strength, sensation and reflexes    throughout          Assessment & Plan:

## 2019-08-12 NOTE — Assessment & Plan Note (Signed)
Pt's PE WNL w/ exception of obesity.  UTD on GYN.  Flu shot given.  Check labs.  Anticipatory guidance provided.

## 2019-08-12 NOTE — Assessment & Plan Note (Signed)
Pt is down 10 lbs since last visit.  Started Keto.  Applauded her efforts.  Encouraged healthy diet and regular exercise.  Will follow.

## 2019-08-12 NOTE — Patient Instructions (Addendum)
Follow up in 1 year or as needed We'll notify of your lab results and make any changes if needed Continue to work on healthy diet and regular exercise- you're doing great!! Call with any questions or concerns Stay Safe!!!

## 2019-08-12 NOTE — Assessment & Plan Note (Signed)
Pt w/ hx of this.  Check labs and replete prn.

## 2019-08-13 ENCOUNTER — Other Ambulatory Visit: Payer: Self-pay | Admitting: General Practice

## 2019-08-13 MED ORDER — VITAMIN D (ERGOCALCIFEROL) 1.25 MG (50000 UNIT) PO CAPS: 50000.0000 [IU] | ORAL_CAPSULE | ORAL | 0 refills | Status: DC

## 2019-08-20 ENCOUNTER — Ambulatory Visit (INDEPENDENT_AMBULATORY_CARE_PROVIDER_SITE_OTHER): Payer: 59 | Admitting: Psychology

## 2019-08-20 DIAGNOSIS — F411 Generalized anxiety disorder: Secondary | ICD-10-CM | POA: Diagnosis not present

## 2019-09-14 ENCOUNTER — Other Ambulatory Visit: Payer: Self-pay | Admitting: Family Medicine

## 2019-09-23 ENCOUNTER — Ambulatory Visit: Payer: 59 | Admitting: Psychology

## 2019-11-10 ENCOUNTER — Encounter: Payer: Self-pay | Admitting: Physician Assistant

## 2019-11-10 ENCOUNTER — Other Ambulatory Visit: Payer: Self-pay

## 2019-11-10 ENCOUNTER — Ambulatory Visit (INDEPENDENT_AMBULATORY_CARE_PROVIDER_SITE_OTHER): Payer: 59 | Admitting: Physician Assistant

## 2019-11-10 VITALS — HR 90 | Temp 98.8°F

## 2019-11-10 DIAGNOSIS — J069 Acute upper respiratory infection, unspecified: Secondary | ICD-10-CM | POA: Diagnosis not present

## 2019-11-10 NOTE — Progress Notes (Signed)
   Virtual Visit via Video   I connected with patient on 11/10/19 at  2:30 PM EST by a video enabled telemedicine application and verified that I am speaking with the correct person using two identifiers.  Location patient: Home Location provider: [facility], Office Persons participating in the virtual visit: Patient, Provider, [place] (Patina Moore)  I discussed the limitations of evaluation and management by telemedicine and the availability of in person appointments. The patient expressed understanding and agreed to proceed.  Subjective:   HPI:   Patient presents via [facility] for c/o upper respiratory symptoms. Patient's husband is an MD- husband of CMA in his office tested positive. Symptoms began two days ago, endorses malaise, generalized body aches (much improved since yesterday), and chills. Started with cough which has since resolved. Denies fever, nausea, vomiting, diarrhea, shortness of breath, difficulty breathing, sinus pain, pressure. Normal appetite. Taking ibuprofen and Tylenol PM with pain relief.   ROS:   See pertinent positives and negatives per HPI.  Patient Active Problem List   Diagnosis Date Noted  . Obesity (BMI 30-39.9) 08/12/2019  . Vitamin D deficiency 08/12/2019  . PCOS (polycystic ovarian syndrome) 05/06/2018  . GERD (gastroesophageal reflux disease) 02/13/2018  . Eustachian tube anomaly 02/13/2018  . Depression, recurrent ([place]) 01/03/2018  . Gestational diabetes 01/23/2017  . Physical exam 12/28/2015  . Pelvic adhesive disease 06/17/2015    Class: Present on Admission  . Menorrhagia with irregular cycle 12/20/2014  . Anemia 12/20/2014  . Hyperlipidemia 11/22/2014  . Anxiety state 11/22/2014    Social History   Tobacco Use  . Smoking status: Former Smoker    Packs/day: 1.00    Years: 10.00    Pack years: 10.00    Types: Cigarettes    Quit date: 11/23/2011    Years since quitting: 7.9  . Smokeless tobacco: Never Used  Substance Use  Topics  . Alcohol use: Yes    Comment: rare    Current Outpatient Medications:  .  aspirin EC 81 MG tablet, Take 81 mg by mouth daily., Disp: , Rfl:  .  escitalopram (LEXAPRO) 10 MG tablet, TAKE 1 TABLET BY MOUTH DAILY., Disp: 90 tablet, Rfl: 0 .  megestrol (MEGACE) 40 MG tablet, Take 1 tablet (40 mg total) by mouth 2 (two) times daily. (Patient taking differently: Take 80 mg by mouth 2 (two) times daily. ), Disp: 60 tablet, Rfl: 5 .  norethindrone-ethinyl estradiol (LOESTRIN FE) 1-20 MG-MCG tablet, Take 1 tablet by mouth daily., Disp: 3 Package, Rfl: 4  No Known Allergies  Objective:   Pulse 90   Temp 98.8 F (37.1 C) (Temporal)   Patient is well-developed, well-nourished in no acute distress.  Resting comfortably at home.  Head is normocephalic, atraumatic.  No labored breathing.  Speech is clear and coherent with logical content.  Patient is alert and oriented at baseline.   Assessment and Plan:   1. Viral URI Mild symptoms x 2 days, already improving. No direct exposure to COVID. Continue OTC medications. Supportive measures reviewed. Discussed precautions that would prompt need for COVID symptoms.     Leeanne Rio, PA-C 11/10/2019

## 2019-11-10 NOTE — Patient Instructions (Signed)
Instructions sent to MyChart.  Please keep well-hydrated and get plenty of rest.  Continue Tylenol or Ibuprofen for mild aches. Mucinex-DM if needed for congestion or cough. Let me know ASAP if you note any worsening symptoms. This would prompt need for COVID testing.   Hang in there!

## 2019-11-10 NOTE — Progress Notes (Signed)
I have discussed the procedure for the virtual visit with the patient who has given consent to proceed with assessment and treatment.   Maysa Lynn S Lenard Kampf, CMA     

## 2019-11-11 ENCOUNTER — Ambulatory Visit (INDEPENDENT_AMBULATORY_CARE_PROVIDER_SITE_OTHER): Payer: 59 | Admitting: Psychology

## 2019-11-11 DIAGNOSIS — F411 Generalized anxiety disorder: Secondary | ICD-10-CM

## 2019-12-21 ENCOUNTER — Other Ambulatory Visit: Payer: Self-pay | Admitting: Family Medicine

## 2019-12-23 ENCOUNTER — Ambulatory Visit (INDEPENDENT_AMBULATORY_CARE_PROVIDER_SITE_OTHER): Payer: 59 | Admitting: Psychology

## 2019-12-23 DIAGNOSIS — F411 Generalized anxiety disorder: Secondary | ICD-10-CM

## 2020-02-03 ENCOUNTER — Ambulatory Visit (INDEPENDENT_AMBULATORY_CARE_PROVIDER_SITE_OTHER): Payer: 59 | Admitting: Psychology

## 2020-02-03 DIAGNOSIS — F411 Generalized anxiety disorder: Secondary | ICD-10-CM

## 2020-02-04 ENCOUNTER — Encounter: Payer: Self-pay | Admitting: Family Medicine

## 2020-02-10 ENCOUNTER — Other Ambulatory Visit: Payer: Self-pay

## 2020-02-10 ENCOUNTER — Ambulatory Visit (INDEPENDENT_AMBULATORY_CARE_PROVIDER_SITE_OTHER): Payer: 59 | Admitting: Family Medicine

## 2020-02-10 ENCOUNTER — Encounter: Payer: Self-pay | Admitting: Family Medicine

## 2020-02-10 DIAGNOSIS — F339 Major depressive disorder, recurrent, unspecified: Secondary | ICD-10-CM | POA: Diagnosis not present

## 2020-02-10 MED ORDER — ESCITALOPRAM OXALATE 20 MG PO TABS: 20.0000 mg | ORAL_TABLET | Freq: Every day | ORAL | 3 refills | Status: DC

## 2020-02-10 NOTE — Telephone Encounter (Signed)
Pt called in asking about this, I have scheduled her an appt with Dr. Birdie Riddle.

## 2020-02-10 NOTE — Progress Notes (Signed)
   Virtual Visit via Video   I connected with patient on 02/10/20 at  3:30 PM EST by a video enabled telemedicine application and verified that I am speaking with the correct person using two identifiers.  Location patient: Home Location provider: [facility], Office Persons participating in the virtual visit: Patient, Provider, [place] (Jess B)  I discussed the limitations of evaluation and management by telemedicine and the availability of in person appointments. The patient expressed understanding and agreed to proceed.  Subjective:   HPI:   Depression- ongoing issue for pt.  Seeing Trey Paula monthly.  Since December has had increased anxiety and irritability.  December is typically a hard month for pt but nothing has improved since then.  Some sadness.  ROS:   See pertinent positives and negatives per HPI.  Patient Active Problem List   Diagnosis Date Noted  . Obesity (BMI 30-39.9) 08/12/2019  . Vitamin D deficiency 08/12/2019  . PCOS (polycystic ovarian syndrome) 05/06/2018  . GERD (gastroesophageal reflux disease) 02/13/2018  . Eustachian tube anomaly 02/13/2018  . Depression, recurrent ([place]) 01/03/2018  . Gestational diabetes 01/23/2017  . Physical exam 12/28/2015  . Pelvic adhesive disease 06/17/2015    Class: Present on Admission  . Menorrhagia with irregular cycle 12/20/2014  . Anemia 12/20/2014  . Hyperlipidemia 11/22/2014  . Anxiety state 11/22/2014    Social History   Tobacco Use  . Smoking status: Former Smoker    Packs/day: 1.00    Years: 10.00    Pack years: 10.00    Types: Cigarettes    Quit date: 11/23/2011    Years since quitting: 8.2  . Smokeless tobacco: Never Used  Substance Use Topics  . Alcohol use: Yes    Comment: rare    Current Outpatient Medications:  .  aspirin EC 81 MG tablet, Take 81 mg by mouth daily., Disp: , Rfl:  .  escitalopram (LEXAPRO) 10 MG tablet, TAKE 1 TABLET BY MOUTH DAILY., Disp: 90 tablet, Rfl: 0 .  megestrol  (MEGACE) 40 MG tablet, Take 1 tablet (40 mg total) by mouth 2 (two) times daily. (Patient taking differently: Take 80 mg by mouth 2 (two) times daily. ), Disp: 60 tablet, Rfl: 5 .  norethindrone-ethinyl estradiol (LOESTRIN FE) 1-20 MG-MCG tablet, Take 1 tablet by mouth daily., Disp: 3 Package, Rfl: 4  No Known Allergies  Objective:   There were no vitals taken for this visit.  AAOx3, NAD NCAT, EOMI No obvious CN deficits Coloring WNL Pt is able to speak clearly, coherently without shortness of breath or increased work of breathing.  Thought process is linear.  Mood is appropriate.   Assessment and Plan:   Depression- recently deteriorated.  Pt and therapist feel that increasing medication to 20mg  is the next step so will increase her Lexapro and monitor closely for improvement.  Pt expressed understanding and is in agreement w/ plan.    Annye Asa, MD 02/10/2020

## 2020-02-10 NOTE — Progress Notes (Signed)
I have discussed the procedure for the virtual visit with the patient who has given consent to proceed with assessment and treatment.   Pt unable to obtain vitals.   Kilian Schwartz L Zariyah Stephens, CMA     

## 2020-03-06 ENCOUNTER — Ambulatory Visit: Payer: 59 | Attending: Internal Medicine

## 2020-03-06 DIAGNOSIS — Z23 Encounter for immunization: Secondary | ICD-10-CM

## 2020-03-06 NOTE — Progress Notes (Signed)
   Covid-19 Vaccination Clinic  Name:  Cynthia [name]    MRN: [id number] DOB: 08-19-1981  03/06/2020  [name] was observed post Covid-19 immunization for 15 minutes without incident. She was provided with Vaccine Information Sheet and instruction to access the V-Safe system.   [name] was instructed to call 911 with any severe reactions post vaccine: Marland Kitchen Difficulty breathing  . Swelling of face and throat  . A fast heartbeat  . A bad rash all over body  . Dizziness and weakness   Immunizations Administered    Name Date Dose VIS Date Route   Pfizer COVID-19 Vaccine 03/06/2020  2:43 PM 0.3 mL 11/20/2019 Intramuscular   Manufacturer: [place]   Lot: [id number]   [place]: [id number]

## 2020-03-16 ENCOUNTER — Ambulatory Visit: Payer: 59 | Admitting: Psychology

## 2020-03-23 ENCOUNTER — Telehealth (INDEPENDENT_AMBULATORY_CARE_PROVIDER_SITE_OTHER): Payer: 59 | Admitting: Family Medicine

## 2020-03-23 ENCOUNTER — Encounter: Payer: Self-pay | Admitting: Family Medicine

## 2020-03-23 ENCOUNTER — Other Ambulatory Visit: Payer: Self-pay

## 2020-03-23 DIAGNOSIS — F339 Major depressive disorder, recurrent, unspecified: Secondary | ICD-10-CM | POA: Diagnosis not present

## 2020-03-23 NOTE — Progress Notes (Signed)
I have discussed the procedure for the virtual visit with the patient who has given consent to proceed with assessment and treatment.   Pt unable to obtain vitals.   Dantrell Schertzer L Amira Podolak, CMA     

## 2020-03-23 NOTE — Progress Notes (Signed)
   Virtual Visit via Video   I connected with patient on 03/23/20 at  3:00 PM EDT by a video enabled telemedicine application and verified that I am speaking with the correct person using two identifiers.  Location patient: Home Location provider: [facility], Office Persons participating in the virtual visit: Patient, Provider, [place] (Jess B)  I discussed the limitations of evaluation and management by telemedicine and the availability of in person appointments. The patient expressed understanding and agreed to proceed.  Subjective:   HPI:   Depression- at last visit Lexapro was increased to 20mg  daily.  Pt reports that increasing lexapro has helped 'quite a bit'.  Continues w/ counseling.  Husband has noticed a difference- 'much less irritable'.    ROS:   See pertinent positives and negatives per HPI.  Patient Active Problem List   Diagnosis Date Noted  . Obesity (BMI 30-39.9) 08/12/2019  . Vitamin D deficiency 08/12/2019  . PCOS (polycystic ovarian syndrome) 05/06/2018  . GERD (gastroesophageal reflux disease) 02/13/2018  . Eustachian tube anomaly 02/13/2018  . Depression, recurrent ([place]) 01/03/2018  . Gestational diabetes 01/23/2017  . Physical exam 12/28/2015  . Pelvic adhesive disease 06/17/2015    Class: Present on Admission  . Menorrhagia with irregular cycle 12/20/2014  . Anemia 12/20/2014  . Hyperlipidemia 11/22/2014  . Anxiety state 11/22/2014    Social History   Tobacco Use  . Smoking status: Former Smoker    Packs/day: 1.00    Years: 10.00    Pack years: 10.00    Types: Cigarettes    Quit date: 11/23/2011    Years since quitting: 8.3  . Smokeless tobacco: Never Used  Substance Use Topics  . Alcohol use: Yes    Comment: rare    Current Outpatient Medications:  .  aspirin EC 81 MG tablet, Take 81 mg by mouth daily., Disp: , Rfl:  .  escitalopram (LEXAPRO) 20 MG tablet, Take 1 tablet (20 mg total) by mouth daily., Disp: 30 tablet, Rfl: 3 .   megestrol (MEGACE) 40 MG tablet, Take 1 tablet (40 mg total) by mouth 2 (two) times daily. (Patient taking differently: Take 80 mg by mouth 2 (two) times daily. ), Disp: 60 tablet, Rfl: 5 .  norethindrone-ethinyl estradiol (LOESTRIN FE) 1-20 MG-MCG tablet, Take 1 tablet by mouth daily., Disp: 3 Package, Rfl: 4  No Known Allergies  Objective:   There were no vitals taken for this visit. AAOx3, NAD NCAT, EOMI No obvious CN deficits Coloring WNL Pt is able to speak clearly, coherently without shortness of breath or increased work of breathing.  Thought process is linear.  Mood is appropriate.   Assessment and Plan:   Depression- improved w/ increased dose of Lexapro.  No changes at this time.  Will follow.   Annye Asa, MD 03/23/2020

## 2020-03-30 ENCOUNTER — Ambulatory Visit: Payer: Self-pay

## 2020-04-02 ENCOUNTER — Ambulatory Visit: Payer: 59 | Attending: Internal Medicine

## 2020-04-02 DIAGNOSIS — Z23 Encounter for immunization: Secondary | ICD-10-CM

## 2020-04-02 NOTE — Progress Notes (Signed)
   Covid-19 Vaccination Clinic  Name:  Rhyleigh Debari    MRN: [id number] DOB: 03-10-1981  04/02/2020  Ms. Ashwell was observed post Covid-19 immunization for 15 minutes without incident. She was provided with Vaccine Information Sheet and instruction to access the V-Safe system.   Ms. Teigland was instructed to call 911 with any severe reactions post vaccine: Marland Kitchen Difficulty breathing  . Swelling of face and throat  . A fast heartbeat  . A bad rash all over body  . Dizziness and weakness   Immunizations Administered    Name Date Dose VIS Date Route   Pfizer COVID-19 Vaccine 04/02/2020  3:09 PM 0.3 mL 02/03/2019 Intramuscular   Manufacturer: [employer]   Lot: [id number]   [place]: [id number]

## 2020-04-04 ENCOUNTER — Encounter: Payer: Self-pay | Admitting: *Deleted

## 2020-04-27 ENCOUNTER — Ambulatory Visit: Payer: 59 | Admitting: Psychology

## 2020-07-01 ENCOUNTER — Other Ambulatory Visit: Payer: Self-pay | Admitting: Family Medicine

## 2020-07-01 ENCOUNTER — Telehealth: Payer: Self-pay | Admitting: Family Medicine

## 2020-07-01 MED ORDER — ESCITALOPRAM OXALATE 20 MG PO TABS: 20.0000 mg | ORAL_TABLET | Freq: Every day | ORAL | 1 refills | Status: DC

## 2020-07-01 NOTE — Telephone Encounter (Signed)
Medication filled to pharmacy as requested.   

## 2020-07-01 NOTE — Telephone Encounter (Signed)
..   LAST APPOINTMENT DATE: 03/23/20   NEXT APPOINTMENT DATE:09/28/20  MEDICATION:escitalopram  PHARMACY:[facility] on [address]   **Let patient know to contact pharmacy at the end of the day to make sure medication is ready. **  ** Please notify patient to allow 48-72 hours to process**  **Encourage patient to contact the pharmacy for refills or they can request refills through [facility]**  CLINICAL FILLS OUT ALL BELOW:   LAST REFILL:  QTY:  REFILL DATE:    OTHER COMMENTS:  Patient is requesting script w/ 90 day refills    Okay for refill?  Please advise

## 2020-07-20 ENCOUNTER — Encounter (INDEPENDENT_AMBULATORY_CARE_PROVIDER_SITE_OTHER): Payer: Self-pay | Admitting: Family Medicine

## 2020-07-20 ENCOUNTER — Ambulatory Visit (INDEPENDENT_AMBULATORY_CARE_PROVIDER_SITE_OTHER): Payer: 59 | Admitting: Family Medicine

## 2020-07-20 ENCOUNTER — Other Ambulatory Visit: Payer: Self-pay

## 2020-07-20 VITALS — BP 141/89 | HR 82 | Temp 98.0°F | Ht 62.0 in | Wt 223.0 lb

## 2020-07-20 DIAGNOSIS — R0602 Shortness of breath: Secondary | ICD-10-CM | POA: Diagnosis not present

## 2020-07-20 DIAGNOSIS — E559 Vitamin D deficiency, unspecified: Secondary | ICD-10-CM

## 2020-07-20 DIAGNOSIS — Z1331 Encounter for screening for depression: Secondary | ICD-10-CM

## 2020-07-20 DIAGNOSIS — Z9189 Other specified personal risk factors, not elsewhere classified: Secondary | ICD-10-CM | POA: Diagnosis not present

## 2020-07-20 DIAGNOSIS — Z6841 Body Mass Index (BMI) 40.0 and over, adult: Secondary | ICD-10-CM

## 2020-07-20 DIAGNOSIS — R5383 Other fatigue: Secondary | ICD-10-CM

## 2020-07-20 DIAGNOSIS — Z0289 Encounter for other administrative examinations: Secondary | ICD-10-CM

## 2020-07-20 DIAGNOSIS — R739 Hyperglycemia, unspecified: Secondary | ICD-10-CM | POA: Diagnosis not present

## 2020-07-21 LAB — COMPREHENSIVE METABOLIC PANEL
ALT: 10 IU/L (ref 0–32)
AST: 12 IU/L (ref 0–40)
Albumin/Globulin Ratio: 1.4 (ref 1.2–2.2)
Albumin: 4.1 g/dL (ref 3.8–4.8)
Alkaline Phosphatase: 68 IU/L (ref 48–121)
BUN/Creatinine Ratio: 20 (ref 9–23)
BUN: 13 mg/dL (ref 6–20)
Bilirubin Total: <0.2 mg/dL (ref 0.0–1.2)
CO2: 23 mmol/L (ref 20–29)
Calcium: 9.2 mg/dL (ref 8.7–10.2)
Chloride: 104 mmol/L (ref 96–106)
Creatinine, Ser: 0.65 mg/dL (ref 0.57–1.00)
GFR calc Af Amer: 129 mL/min/{1.73_m2} (ref >59)
GFR calc non Af Amer: 112 mL/min/{1.73_m2} (ref >59)
Globulin, Total: 3 g/dL (ref 1.5–4.5)
Glucose: 111 mg/dL — ABNORMAL HIGH (ref 65–99)
Potassium: 4.6 mmol/L (ref 3.5–5.2)
Sodium: 140 mmol/L (ref 134–144)
Total Protein: 7.1 g/dL (ref 6.0–8.5)

## 2020-07-21 LAB — CBC WITH DIFFERENTIAL/PLATELET
Basophils Absolute: 0 10*3/uL (ref 0.0–0.2)
Basos: 0 %
EOS (ABSOLUTE): 0.2 10*3/uL (ref 0.0–0.4)
Eos: 3 %
Hematocrit: 38.3 % (ref 34.0–46.6)
Hemoglobin: 11.9 g/dL (ref 11.1–15.9)
Immature Grans (Abs): 0 10*3/uL (ref 0.0–0.1)
Immature Granulocytes: 0 %
Lymphocytes Absolute: 2 10*3/uL (ref 0.7–3.1)
Lymphs: 25 %
MCH: 26.3 pg — ABNORMAL LOW (ref 26.6–33.0)
MCHC: 31.1 g/dL — ABNORMAL LOW (ref 31.5–35.7)
MCV: 85 fL (ref 79–97)
Monocytes Absolute: 0.4 10*3/uL (ref 0.1–0.9)
Monocytes: 5 %
Neutrophils Absolute: 5.2 10*3/uL (ref 1.4–7.0)
Neutrophils: 67 %
Platelets: 343 10*3/uL (ref 150–450)
RBC: 4.53 x10E6/uL (ref 3.77–5.28)
RDW: 14.8 % (ref 11.7–15.4)
WBC: 7.8 10*3/uL (ref 3.4–10.8)

## 2020-07-21 LAB — TSH: TSH: 2.1 u[IU]/mL (ref 0.450–4.500)

## 2020-07-21 LAB — HEMOGLOBIN A1C
Est. average glucose Bld gHb Est-mCnc: 128 mg/dL
Hgb A1c MFr Bld: 6.1 % — ABNORMAL HIGH (ref 4.8–5.6)

## 2020-07-21 LAB — LIPID PANEL WITH LDL/HDL RATIO
Cholesterol, Total: 206 mg/dL — ABNORMAL HIGH (ref 100–199)
HDL: 49 mg/dL (ref >39)
LDL Chol Calc (NIH): 134 mg/dL — ABNORMAL HIGH (ref 0–99)
LDL/HDL Ratio: 2.7 ratio (ref 0.0–3.2)
Triglycerides: 131 mg/dL (ref 0–149)
VLDL Cholesterol Cal: 23 mg/dL (ref 5–40)

## 2020-07-21 LAB — VITAMIN B12: Vitamin B-12: 402 pg/mL (ref 232–1245)

## 2020-07-21 LAB — FOLATE: Folate: 18.5 ng/mL (ref >3.0)

## 2020-07-21 LAB — T3: T3, Total: 147 ng/dL (ref 71–180)

## 2020-07-21 LAB — INSULIN, RANDOM: INSULIN: 25.6 u[IU]/mL — ABNORMAL HIGH (ref 2.6–24.9)

## 2020-07-21 LAB — VITAMIN D 25 HYDROXY (VIT D DEFICIENCY, FRACTURES): Vit D, 25-Hydroxy: 16.3 ng/mL — ABNORMAL LOW (ref 30.0–100.0)

## 2020-07-21 LAB — T4: T4, Total: 10 ug/dL (ref 4.5–12.0)

## 2020-07-26 NOTE — Progress Notes (Signed)
Chief Complaint:   OBESITY Cynthia [name] (MR# [id number]) is a 39 y.o. female who presents for evaluation and treatment of obesity and related comorbidities. Current BMI is Body mass index is 40.79 kg/m. Cynthia [name] has been struggling with her weight for many years and has been unsuccessful in either losing weight, maintaining weight loss, or reaching her healthy weight goal.  Cynthia [name] was told about our clinic from her therapist. Coffee with 1 tablespoon of creamer and 4 splendas. Lunch-bagel thin with 3 eggs, 2 whites and 1 whole, homemade soup (liekly lentil 1.5 cup). Snack-Fruit Loops with 1/2 cup of whole milk, and dinner-[employer] most nights.  Cynthia [name] is currently in the action stage of change and ready to dedicate time achieving and maintaining a healthier weight. Cynthia [name] is interested in becoming our patient and working on intensive lifestyle modifications including (but not limited to) diet and exercise for weight loss.  Cynthia [name]'s habits were reviewed today and are as follows: Her family eats meals together, she thinks her family will eat healthier with her, her desired weight loss is 98 lbs, she started gaining weight in her 30's, her heaviest weight ever was 224 pounds, she has significant food cravings issues, she snacks frequently in the evenings, she skips meals frequently, she frequently makes poor food choices, she frequently eats larger portions than normal and she struggles with emotional eating.  Depression Screen Cynthia [name]'s Food and Mood (modified PHQ-9) score was 13.  Depression screen PHQ 2/9 07/20/2020  Decreased Interest 2  Down, Depressed, Hopeless 3  PHQ - 2 Score 5  Altered sleeping 0  Tired, decreased energy 3  Change in appetite 2  Feeling bad or failure about yourself  3  Trouble concentrating 0  Moving slowly or fidgety/restless 0  Suicidal thoughts 0  PHQ-9 Score 13  Difficult doing work/chores Not difficult at all   Subjective:   1. Other  fatigue Cynthia [name] admits to daytime somnolence and admits to waking up still tired. Patent has a history of symptoms of daytime fatigue. Cynthia [name] generally gets 6 or 7 hours of sleep per night, and states that she has generally restful sleep. Snoring is present. Apneic episodes are not present. [name] is 6. EKG-PAC with ST depression in III and aVF.  2. SOB (shortness of breath) on exertion [name] notes increasing shortness of breath with exercising and seems to be worsening over time with weight gain. She notes getting out of breath sooner with activity than she used to. This has not gotten worse recently. [name] denies shortness of breath at rest or orthopnea.  3. Vitamin D deficiency [name] is not on Vit D replacement, and she notes fatigue.  4. Hyperglycemia [name] has a history of gestational diabetes.  5. At risk for osteoporosis [name] is at higher risk of osteopenia and osteoporosis due to Vitamin D deficiency.   Assessment/Plan:   1. Other fatigue Trust does feel that her weight is causing her energy to be lower than it should be. Fatigue may be related to obesity, depression or many other causes. Labs will be ordered, and in the meanwhile, Cynthia [name] will focus on self care including making healthy food choices, increasing physical activity and focusing on stress reduction.  - EKG 12-Lead - Comprehensive metabolic panel - CBC with Differential/Platelet - Vitamin B12 - Folate - T3 - T4 - TSH  2. SOB (shortness of breath) on exertion Alley does feel that she gets out of breath more easily that she used to  when she exercises. Cynthia [name]'s shortness of breath appears to be obesity related and exercise induced. She has agreed to work on weight loss and gradually increase exercise to treat her exercise induced shortness of breath. Will continue to monitor closely. We will refer for and echocardiogram.  - Comprehensive metabolic panel - CBC with Differential/Platelet -  Vitamin B12 - Folate - T3 - T4 - TSH - ECHOCARDIOGRAM COMPLETE; Future  3. Vitamin D deficiency Low Vitamin D level contributes to fatigue and are associated with obesity, breast, and colon cancer. We will check labs today. Cynthia [name] will follow-up for routine testing of Vitamin D, at least 2-3 times per year to avoid over-replacement.  - VITAMIN D 25 Hydroxy (Vit-D Deficiency, Fractures)  4. Hyperglycemia Fasting labs will be obtained today, and results with be discussed with [name] in 2 weeks at her follow up visit. In the meanwhile Cynthia [name] was started on a lower simple carbohydrate diet and will work on weight loss efforts.  - Hemoglobin A1c - Insulin, random - Lipid Panel With LDL/HDL Ratio  5. Depression screening Cynthia [name] had a positive depression screening. Depression is commonly associated with obesity and often results in emotional eating behaviors. We will monitor this closely and work on CBT to help improve the non-hunger eating patterns. Referral to Psychology may be required if no improvement is seen as she continues in our clinic.  6. At risk for osteoporosis Cynthia [name] was given approximately 15 minutes of osteoporosis prevention counseling today. Cynthia [name] is at risk for osteopenia and osteoporosis due to her Vitamin D deficiency. She was encouraged to take her Vitamin D and follow her higher calcium diet and increase strengthening exercise to help strengthen her bones and decrease her risk of osteopenia and osteoporosis.  Repetitive spaced learning was employed today to elicit superior memory formation and behavioral change.  4. Class 3 severe obesity with serious comorbidity and body mass index (BMI) of 40.0 to 44.9 in adult, unspecified obesity type ([place]) Cynthia [name] is currently in the action stage of change and her goal is to continue with weight loss efforts. I recommend Yancy begin the structured treatment plan as follows:  She has agreed to the Category 3 Plan with 8 oz of meat  at dinnner and keeping a food journal and adhering to recommended goals of 450-550 calories and 40+ grams of protein at supper daily.  Exercise goals: No exercise has been prescribed at this time.   Behavioral modification strategies: increasing lean protein intake, meal planning and cooking strategies, keeping healthy foods in the home and planning for success.  She was informed of the importance of frequent follow-up visits to maximize her success with intensive lifestyle modifications for her multiple health conditions. She was informed we would discuss her lab results at her next visit unless there is a critical issue that needs to be addressed sooner. Natalynn agreed to keep her next visit at the agreed upon time to discuss these results.  Objective:   Blood pressure (!) 141/89, pulse 82, temperature 98 F (36.7 C), temperature source Oral, height 5\' 2"  (1.575 m), weight 223 lb (101.2 kg), last menstrual period 07/04/2020, SpO2 100 %. Body mass index is 40.79 kg/m.  EKG: Normal sinus rhythm, rate 81 BPM.  Indirect Calorimeter completed today shows a VO2 of 249 and a REE of 1735.  Her calculated basal metabolic rate is 6599 thus her basal metabolic rate is worse than expected.  General: Cooperative, alert, well developed, in no acute distress. HEENT: Conjunctivae and lids unremarkable.  Cardiovascular: Regular rhythm.  Lungs: Normal work of breathing. Neurologic: No focal deficits.   Lab Results  Component Value Date   CREATININE 0.65 07/20/2020   BUN 13 07/20/2020   NA 140 07/20/2020   K 4.6 07/20/2020   CL 104 07/20/2020   CO2 23 07/20/2020   Lab Results  Component Value Date   ALT 10 07/20/2020   AST 12 07/20/2020   ALKPHOS 68 07/20/2020   BILITOT <0.2 07/20/2020   Lab Results  Component Value Date   HGBA1C 6.1 (H) 07/20/2020   HGBA1C 4.5 (L) 03/18/2018   HGBA1C 5.5 01/16/2017   HGBA1C 5.9 12/28/2015   Lab Results  Component Value Date   INSULIN 25.6 (H)  07/20/2020   Lab Results  Component Value Date   TSH 2.100 07/20/2020   Lab Results  Component Value Date   CHOL 206 (H) 07/20/2020   HDL 49 07/20/2020   LDLCALC 134 (H) 07/20/2020   LDLDIRECT 110.0 12/28/2015   TRIG 131 07/20/2020   CHOLHDL 5 08/12/2019   Lab Results  Component Value Date   WBC 7.8 07/20/2020   HGB 11.9 07/20/2020   HCT 38.3 07/20/2020   MCV 85 07/20/2020   PLT 343 07/20/2020   Lab Results  Component Value Date   IRON 158 (H) 12/27/2014   TIBC 422 12/27/2014   FERRITIN 45 12/27/2014   Attestation Statements:   Reviewed by clinician on day of visit: allergies, medications, problem list, medical history, surgical history, family history, social history, and previous encounter notes.   I, Trixie Dredge, am acting as transcriptionist for Coralie Common, MD.  This is the patient's first visit at [facility]. The patient's NEW PATIENT PACKET was reviewed at length. Included in the packet: current and past health history, medications, allergies, ROS, gynecologic history (women only), surgical history, family history, social history, weight history, weight loss surgery history (for those that have had weight loss surgery), nutritional evaluation, mood and food questionnaire, PHQ9, Epworth questionnaire, sleep habits questionnaire, patient life and health improvement goals questionnaire. These will all be scanned into the patient's chart under media.   During the visit, I independently reviewed the patient's EKG, bioimpedance scale results, and indirect calorimeter results. I used this information to tailor a meal plan for the patient that will help her to lose weight and will improve her obesity-related conditions going forward. I performed a medically necessary appropriate examination and/or evaluation. I discussed the assessment and treatment plan with the patient. The patient was provided an opportunity to ask questions and all were answered. The  patient agreed with the plan and demonstrated an understanding of the instructions. Labs were ordered at this visit and will be reviewed at the next visit unless more critical results need to be addressed immediately. Clinical information was updated and documented in the EMR.   Time spent on visit including pre-visit chart review and post-visit care was 45 minutes.   A separate 15 minutes was spent on risk counseling (see above).  I have reviewed the above documentation for accuracy and completeness, and I agree with the above. - Jinny Blossom, MD

## 2020-08-03 ENCOUNTER — Other Ambulatory Visit: Payer: Self-pay

## 2020-08-03 ENCOUNTER — Encounter (INDEPENDENT_AMBULATORY_CARE_PROVIDER_SITE_OTHER): Payer: Self-pay | Admitting: Family Medicine

## 2020-08-03 ENCOUNTER — Ambulatory Visit (INDEPENDENT_AMBULATORY_CARE_PROVIDER_SITE_OTHER): Payer: 59 | Admitting: Family Medicine

## 2020-08-03 VITALS — BP 134/79 | HR 78 | Temp 98.3°F | Ht 62.0 in | Wt 218.0 lb

## 2020-08-03 DIAGNOSIS — E559 Vitamin D deficiency, unspecified: Secondary | ICD-10-CM | POA: Diagnosis not present

## 2020-08-03 DIAGNOSIS — Z6839 Body mass index (BMI) 39.0-39.9, adult: Secondary | ICD-10-CM

## 2020-08-03 DIAGNOSIS — R7303 Prediabetes: Secondary | ICD-10-CM | POA: Diagnosis not present

## 2020-08-03 DIAGNOSIS — E7849 Other hyperlipidemia: Secondary | ICD-10-CM | POA: Diagnosis not present

## 2020-08-03 DIAGNOSIS — Z9189 Other specified personal risk factors, not elsewhere classified: Secondary | ICD-10-CM | POA: Diagnosis not present

## 2020-08-03 DIAGNOSIS — E66812 Obesity, class 2: Secondary | ICD-10-CM

## 2020-08-03 MED ORDER — VITAMIN D (ERGOCALCIFEROL) 1.25 MG (50000 UNIT) PO CAPS: 50000.0000 [IU] | ORAL_CAPSULE | ORAL | 0 refills | Status: DC

## 2020-08-03 MED ORDER — METFORMIN HCL 500 MG PO TABS: 500.0000 mg | ORAL_TABLET | Freq: Every day | ORAL | 0 refills | Status: DC

## 2020-08-03 NOTE — Progress Notes (Signed)
Chief Complaint:   OBESITY Cynthia Cynthia [name] is here to discuss her progress with her obesity treatment plan along with follow-up of her obesity related diagnoses. Cynthia Cynthia [name] is on the Category 3 Plan with 8 oz of meat at dinner and keeping Cynthia [name] food journal and adhering to recommended goals of 450-550 calories and 40+ grams of protein at supper daily and states she is following her eating plan approximately 100% of the time. Cynthia Cynthia [name] states she is active while chasing the kids.  Today's visit was #: 2 Starting weight: 223 lbs Starting date: 07/20/2020 Today's weight: 218 lbs Today's date: 08/03/2020 Total lbs lost to date: 5 Total lbs lost since last in-office visit: 5  Interim History: Cynthia Cynthia [name] found the meal plan to be relatively easy, but she doesn't care much for yogurt. Finds she portions out the food for the day and then grazes on it throughout the day. She has journaled all day and is averaging around 115 grams of protein per day.  Subjective:   1. Vitamin D deficiency Cynthia Cynthia [name]'s last Vit D level was of 16.3. She is not on Vit D, and she notes fatigue. I discussed labs with the patient today.  2. Other hyperlipidemia Cynthia Cynthia [name] has Cynthia [name] new diagnosis of hyperlipidemia. Last LDL was 134, HDL 49, and triglycerides 131. She is not on statin. I discussed labs with the patient today.  3. Pre-diabetes Cynthia Cynthia [name] has Cynthia [name] new diagnosis of pre-diabetes. Last A1c was 6.1 and insulin 25.6. She was previously on metformin with significant GI side effects of nausea. I discussed labs with the patient today.  4. At risk for diabetes mellitus Cynthia Cynthia [name] is at higher than average risk for developing diabetes due to her obesity.   Assessment/Plan:   1. Vitamin D deficiency Low Vitamin D level contributes to fatigue and are associated with obesity, breast, and colon cancer. Cynthia Cynthia [name] agreed to start prescription Vitamin D 50,000 IU every week with no refills. She will follow-up for routine testing of Vitamin D, at least 2-3 times  per year to avoid over-replacement.  - Vitamin D, Ergocalciferol, (DRISDOL) 1.25 MG (50000 UNIT) CAPS capsule; Take 1 capsule (50,000 Units total) by mouth every 7 (seven) days.  Dispense: 4 capsule; Refill: 0  2. Other hyperlipidemia Cardiovascular risk and specific lipid/LDL goals reviewed. We discussed several lifestyle modifications today and Cynthia Cynthia [name] will continue to work on diet, exercise and weight loss efforts. We will repeat FLP in 3 months. Orders and follow up as documented in patient record.   Counseling Intensive lifestyle modifications are the first line treatment for this issue. . Dietary changes: Increase soluble fiber. Decrease simple carbohydrates. . Exercise changes: Moderate to vigorous-intensity aerobic activity 150 minutes per week if tolerated. . Lipid-lowering medications: see documented in medical record.  3. Pre-diabetes Cynthia Cynthia [name] will continue to work on weight loss, exercise, and decreasing simple carbohydrates to help decrease the risk of diabetes. Cynthia Cynthia [name] agreed to start metformin 500 mg PO daily with no refills, and we will repeat labs in 3 months.  - metFORMIN (GLUCOPHAGE) 500 MG tablet; Take 1 tablet (500 mg total) by mouth daily with breakfast.  Dispense: 30 tablet; Refill: 0  4. At risk for diabetes mellitus Cynthia Cynthia [name] was given approximately 30 minutes of diabetes education and counseling today. We discussed intensive lifestyle modifications today with an emphasis on weight loss as well as increasing exercise and decreasing simple carbohydrates in her diet. We also reviewed medication options with an emphasis on risk versus benefit of those discussed.   Repetitive spaced  learning was employed today to elicit superior memory formation and behavioral change.  5. Class 2 severe obesity with serious comorbidity and body mass index (BMI) of 39.0 to 39.9 in adult, unspecified obesity type (Cynthia Cynthia [name]) Cynthia Cynthia [name] is currently in the action stage of change. As such, her goal is to  continue with weight loss efforts. She has agreed to the Category 3 Plan and keeping Cynthia [name] food journal and adhering to recommended goals of 450-550 calories and 40+ grams of protein at supper daily.   Exercise goals: As is.  Behavioral modification strategies: increasing lean protein intake, meal planning and cooking strategies, keeping healthy foods in the home and planning for success.  Cynthia Cynthia [name] has agreed to follow-up with our clinic in 2 weeks. She was informed of the importance of frequent follow-up visits to maximize her success with intensive lifestyle modifications for her multiple health conditions.   Objective:   Blood pressure 134/79, pulse 78, temperature 98.3 F (36.8 C), temperature source Oral, height 5\' 2"  (1.575 m), weight 218 lb (98.9 kg), last menstrual period 07/27/2020, SpO2 99 %. Body mass index is 39.87 kg/m.  General: Cooperative, alert, well developed, in no acute distress. HEENT: Conjunctivae and lids unremarkable. Cardiovascular: Regular rhythm.  Lungs: Normal work of breathing. Neurologic: No focal deficits.   Lab Results  Component Value Date   CREATININE 0.65 07/20/2020   BUN 13 07/20/2020   NA 140 07/20/2020   K 4.6 07/20/2020   CL 104 07/20/2020   CO2 23 07/20/2020   Lab Results  Component Value Date   ALT 10 07/20/2020   AST 12 07/20/2020   ALKPHOS 68 07/20/2020   BILITOT <0.2 07/20/2020   Lab Results  Component Value Date   HGBA1C 6.1 (H) 07/20/2020   HGBA1C 4.5 (L) 03/18/2018   HGBA1C 5.5 01/16/2017   HGBA1C 5.9 12/28/2015   Lab Results  Component Value Date   INSULIN 25.6 (H) 07/20/2020   Lab Results  Component Value Date   TSH 2.100 07/20/2020   Lab Results  Component Value Date   CHOL 206 (H) 07/20/2020   HDL 49 07/20/2020   LDLCALC 134 (H) 07/20/2020   LDLDIRECT 110.0 12/28/2015   TRIG 131 07/20/2020   CHOLHDL 5 08/12/2019   Lab Results  Component Value Date   WBC 7.8 07/20/2020   HGB 11.9 07/20/2020   HCT 38.3  07/20/2020   MCV 85 07/20/2020   PLT 343 07/20/2020   Lab Results  Component Value Date   IRON 158 (H) 12/27/2014   TIBC 422 12/27/2014   FERRITIN 45 12/27/2014   Attestation Statements:   Reviewed by clinician on day of visit: allergies, medications, problem list, medical history, surgical history, family history, social history, and previous encounter notes.   I, Trixie Dredge, am acting as transcriptionist for Kerri Perches, MD.  I have reviewed the above documentation for accuracy and completeness, and I agree with the above. - Jinny Blossom, MD

## 2020-08-10 ENCOUNTER — Encounter (INDEPENDENT_AMBULATORY_CARE_PROVIDER_SITE_OTHER): Payer: Self-pay | Admitting: Family Medicine

## 2020-08-10 ENCOUNTER — Other Ambulatory Visit (HOSPITAL_COMMUNITY): Payer: 59

## 2020-08-10 NOTE — Telephone Encounter (Signed)
Please advise 

## 2020-08-16 ENCOUNTER — Ambulatory Visit (INDEPENDENT_AMBULATORY_CARE_PROVIDER_SITE_OTHER): Payer: 59 | Admitting: Psychology

## 2020-08-16 DIAGNOSIS — F411 Generalized anxiety disorder: Secondary | ICD-10-CM | POA: Diagnosis not present

## 2020-08-17 ENCOUNTER — Other Ambulatory Visit (HOSPITAL_COMMUNITY): Payer: 59

## 2020-08-24 ENCOUNTER — Ambulatory Visit (INDEPENDENT_AMBULATORY_CARE_PROVIDER_SITE_OTHER): Payer: 59 | Admitting: Family Medicine

## 2020-08-24 ENCOUNTER — Other Ambulatory Visit: Payer: Self-pay

## 2020-08-24 ENCOUNTER — Encounter (INDEPENDENT_AMBULATORY_CARE_PROVIDER_SITE_OTHER): Payer: Self-pay | Admitting: Family Medicine

## 2020-08-24 VITALS — BP 144/89 | HR 66 | Temp 97.5°F | Ht 62.0 in | Wt 214.0 lb

## 2020-08-24 DIAGNOSIS — Z9189 Other specified personal risk factors, not elsewhere classified: Secondary | ICD-10-CM | POA: Diagnosis not present

## 2020-08-24 DIAGNOSIS — E559 Vitamin D deficiency, unspecified: Secondary | ICD-10-CM

## 2020-08-24 DIAGNOSIS — R7303 Prediabetes: Secondary | ICD-10-CM

## 2020-08-24 DIAGNOSIS — E66812 Obesity, class 2: Secondary | ICD-10-CM

## 2020-08-24 DIAGNOSIS — Z6839 Body mass index (BMI) 39.0-39.9, adult: Secondary | ICD-10-CM | POA: Diagnosis not present

## 2020-08-24 MED ORDER — VITAMIN D (ERGOCALCIFEROL) 1.25 MG (50000 UNIT) PO CAPS: 50000.0000 [IU] | ORAL_CAPSULE | ORAL | 0 refills | Status: DC

## 2020-08-25 NOTE — Progress Notes (Signed)
Chief Complaint:   OBESITY Cynthia [name] is here to discuss her progress with her obesity treatment plan along with follow-up of her obesity related diagnoses. Kathalene is on the Category 3 Plan and keeping a food journal and adhering to recommended goals of 450-550 calories and 40+ grams of protein at supper daily and states she is following her eating plan approximately 80% of the time. Briyah states she is doing 0 minutes 0 times per week.  Today's visit was #: 3 Starting weight: 223 lbs Starting date: 07/20/2020 Today's weight: 214 lbs Today's date: 08/24/2020 Total lbs lost to date: 9 Total lbs lost since last in-office visit: 4  Interim History: Jannel made a few adjustments to fruit and bread at breakfast. She denies hunger or cravings. Snack calories were Rice Krispies, Fruit Loops, and sugar free hazelnut creamer. She has plans to eat out in [place] this weekend, so she wont be able to keep track calorically what her dinner will be.  Subjective:   1. Vitamin D deficiency Cynthia [name] denies nausea, vomiting, or muscle weakness, but she notes fatigue. She is on prescription Vit D. Last Vit D level was 16.3.  2. Pre-diabetes Cynthia [name]'s last A1c was 6.1 and insulin 25.6. She is on metformin with only minimal GI upset.  3. At risk for osteoporosis Cynthia [name] is at higher risk of osteopenia and osteoporosis due to Vitamin D deficiency.   Assessment/Plan:   1. Vitamin D deficiency Low Vitamin D level contributes to fatigue and are associated with obesity, breast, and colon cancer. We will refill prescription Vitamin D for 1 month. Akasha will follow-up for routine testing of Vitamin D, at least 2-3 times per year to avoid over-replacement.  - Vitamin D, Ergocalciferol, (DRISDOL) 1.25 MG (50000 UNIT) CAPS capsule; Take 1 capsule (50,000 Units total) by mouth every 7 (seven) days.  Dispense: 4 capsule; Refill: 0  2. Pre-diabetes Cynthia [name] will continue to work on weight loss, exercise, and decreasing  simple carbohydrates to help decrease the risk of diabetes. Cynthia [name] will continue metformin, no refill needed.  3. At risk for osteoporosis Cynthia [name] was given approximately 15 minutes of osteoporosis prevention counseling today. Cynthia [name] is at risk for osteopenia and osteoporosis due to her Vitamin D deficiency. She was encouraged to take her Vitamin D and follow her higher calcium diet and increase strengthening exercise to help strengthen her bones and decrease her risk of osteopenia and osteoporosis.  Repetitive spaced learning was employed today to elicit superior memory formation and behavioral change.  4. Class 2 severe obesity with serious comorbidity and body mass index (BMI) of 39.0 to 39.9 in adult, unspecified obesity type ([place]) Cynthia [name] is currently in the action stage of change. As such, her goal is to continue with weight loss efforts. She has agreed to the Category 3 Plan with 8 oz of meat at dinner.   Exercise goals: No exercise has been prescribed at this time.  Behavioral modification strategies: increasing lean protein intake, meal planning and cooking strategies, keeping healthy foods in the home and planning for success.  Cynthia [name] has agreed to follow-up with our clinic in 2 weeks. She was informed of the importance of frequent follow-up visits to maximize her success with intensive lifestyle modifications for her multiple health conditions.   Objective:   Blood pressure (!) 144/89, pulse 66, temperature (!) 97.5 F (36.4 C), temperature source Oral, height 5\' 2"  (1.575 m), weight 214 lb (97.1 kg), last menstrual period 07/27/2020, SpO2 99 %. Body mass index is  39.14 kg/m.  General: Cooperative, alert, well developed, in no acute distress. HEENT: Conjunctivae and lids unremarkable. Cardiovascular: Regular rhythm.  Lungs: Normal work of breathing. Neurologic: No focal deficits.   Lab Results  Component Value Date   CREATININE 0.65 07/20/2020   BUN 13 07/20/2020   NA 140  07/20/2020   K 4.6 07/20/2020   CL 104 07/20/2020   CO2 23 07/20/2020   Lab Results  Component Value Date   ALT 10 07/20/2020   AST 12 07/20/2020   ALKPHOS 68 07/20/2020   BILITOT <0.2 07/20/2020   Lab Results  Component Value Date   HGBA1C 6.1 (H) 07/20/2020   HGBA1C 4.5 (L) 03/18/2018   HGBA1C 5.5 01/16/2017   HGBA1C 5.9 12/28/2015   Lab Results  Component Value Date   INSULIN 25.6 (H) 07/20/2020   Lab Results  Component Value Date   TSH 2.100 07/20/2020   Lab Results  Component Value Date   CHOL 206 (H) 07/20/2020   HDL 49 07/20/2020   LDLCALC 134 (H) 07/20/2020   LDLDIRECT 110.0 12/28/2015   TRIG 131 07/20/2020   CHOLHDL 5 08/12/2019   Lab Results  Component Value Date   WBC 7.8 07/20/2020   HGB 11.9 07/20/2020   HCT 38.3 07/20/2020   MCV 85 07/20/2020   PLT 343 07/20/2020   Lab Results  Component Value Date   IRON 158 (H) 12/27/2014   TIBC 422 12/27/2014   FERRITIN 45 12/27/2014   Attestation Statements:   Reviewed by clinician on day of visit: allergies, medications, problem list, medical history, surgical history, family history, social history, and previous encounter notes.   I, Trixie Dredge, am acting as transcriptionist for Coralie Common, MD.  I have reviewed the above documentation for accuracy and completeness, and I agree with the above. - Jinny Blossom, MD

## 2020-09-01 ENCOUNTER — Encounter (INDEPENDENT_AMBULATORY_CARE_PROVIDER_SITE_OTHER): Payer: Self-pay

## 2020-09-01 ENCOUNTER — Other Ambulatory Visit (INDEPENDENT_AMBULATORY_CARE_PROVIDER_SITE_OTHER): Payer: Self-pay | Admitting: Family Medicine

## 2020-09-01 DIAGNOSIS — R7303 Prediabetes: Secondary | ICD-10-CM

## 2020-09-01 NOTE — Telephone Encounter (Signed)
Message sent to pt-[facility] 

## 2020-09-07 ENCOUNTER — Other Ambulatory Visit: Payer: Self-pay

## 2020-09-07 ENCOUNTER — Other Ambulatory Visit (INDEPENDENT_AMBULATORY_CARE_PROVIDER_SITE_OTHER): Payer: Self-pay | Admitting: Family Medicine

## 2020-09-07 ENCOUNTER — Ambulatory Visit (INDEPENDENT_AMBULATORY_CARE_PROVIDER_SITE_OTHER): Payer: 59 | Admitting: Family Medicine

## 2020-09-07 ENCOUNTER — Encounter (INDEPENDENT_AMBULATORY_CARE_PROVIDER_SITE_OTHER): Payer: Self-pay | Admitting: Family Medicine

## 2020-09-07 VITALS — BP 135/86 | HR 83 | Temp 98.3°F | Ht 62.0 in | Wt 212.0 lb

## 2020-09-07 DIAGNOSIS — F329 Major depressive disorder, single episode, unspecified: Secondary | ICD-10-CM | POA: Diagnosis not present

## 2020-09-07 DIAGNOSIS — F419 Anxiety disorder, unspecified: Secondary | ICD-10-CM

## 2020-09-07 DIAGNOSIS — Z9189 Other specified personal risk factors, not elsewhere classified: Secondary | ICD-10-CM | POA: Diagnosis not present

## 2020-09-07 DIAGNOSIS — F32A Depression, unspecified: Secondary | ICD-10-CM

## 2020-09-07 DIAGNOSIS — Z6838 Body mass index (BMI) 38.0-38.9, adult: Secondary | ICD-10-CM

## 2020-09-07 DIAGNOSIS — R7303 Prediabetes: Secondary | ICD-10-CM | POA: Diagnosis not present

## 2020-09-07 MED ORDER — METFORMIN HCL 500 MG PO TABS: 500.0000 mg | ORAL_TABLET | Freq: Every day | ORAL | 0 refills | Status: DC

## 2020-09-08 NOTE — Progress Notes (Signed)
Chief Complaint:   OBESITY Cynthia [name] is here to discuss her progress with her obesity treatment plan along with follow-up of her obesity related diagnoses. Cynthia [name] is on the Category 3 Plan and 450-550 calories and 40+ grams of protein at supper and states she is following her eating plan approximately 75-80% of the time. Bellamarie states she is exercising for 0 minutes 0 times per week.  Today's visit was #: 4 Starting weight: 223 lbs Starting date: 07/20/2020 Today's weight: 212 lbs Today's date: 09/07/2020 Total lbs lost to date: 11 lbs Total lbs lost since last in-office visit: 2 lbs  Interim History: Vertis has had a few outings with friends over the last few weeks.  The last 2 weeks have been particularly challenging because her kids stopped napping.  She is going to [employer] [place] for her birthday.  She is going out with a friend the following day.  She realizes it has been harder to stick to the plan due to exhaustion.  She is looking for options for easy supplementation.  Subjective:   1. Prediabetes Cynthia [name] has a diagnosis of prediabetes based on her elevated HgA1c and was informed this puts her at greater risk of developing diabetes. She continues to work on diet and exercise to decrease her risk of diabetes. She denies nausea or hypoglycemia.  She is on metformin and denies GI side effects.  Lab Results  Component Value Date   HGBA1C 6.1 (H) 07/20/2020   Lab Results  Component Value Date   INSULIN 25.6 (H) 07/20/2020   2. Anxiety and depression Denies SI/HI.  She is on Lexapro and denies side effects.  3. At risk for deficient intake of food The patient is at a higher than average risk of deficient intake of food due to not always able to get lunch in.  Assessment/Plan:   1. Prediabetes Panayiota will continue to work on weight loss, exercise, and decreasing simple carbohydrates to help decrease the risk of diabetes.   -Refill metFORMIN (GLUCOPHAGE) 500 MG tablet; Take 1  tablet (500 mg total) by mouth daily with breakfast.  Dispense: 90 tablet; Refill: 0  2. Anxiety and depression Continue Lexapro with no change in dose.  3. At risk for deficient intake of food Cynthia [name] was given approximately 15 minutes of deficit intake of food prevention counseling today. Cynthia [name] is at risk for eating too few calories based on current food recall. She was encouraged to focus on meeting caloric and protein goals according to her recommended meal plan.   4. Class 2 severe obesity with serious comorbidity and body mass index (BMI) of 38.0 to 38.9 in adult, unspecified obesity type ([place])  Cynthia [name] is currently in the action stage of change. As such, her goal is to continue with weight loss efforts. She has agreed to the Category 3 Plan and 450-550 calories and 40+ grams of protein at supper.   Exercise goals: All adults should avoid inactivity. Some physical activity is better than none, and adults who participate in any amount of physical activity gain some health benefits.  Behavioral modification strategies: increasing lean protein intake, increasing vegetables, meal planning and cooking strategies and keeping healthy foods in the home.  Cynthia [name] has agreed to follow-up with our clinic in 2 weeks. She was informed of the importance of frequent follow-up visits to maximize her success with intensive lifestyle modifications for her multiple health conditions.   Objective:   Blood pressure 135/86, pulse 83, temperature 98.3 F (36.8 C), temperature  source Oral, height 5\' 2"  (1.575 m), weight 212 lb (96.2 kg), SpO2 100 %. Body mass index is 38.78 kg/m.  General: Cooperative, alert, well developed, in no acute distress. HEENT: Conjunctivae and lids unremarkable. Cardiovascular: Regular rhythm.  Lungs: Normal work of breathing. Neurologic: No focal deficits.   Lab Results  Component Value Date   CREATININE 0.65 07/20/2020   BUN 13 07/20/2020   NA 140 07/20/2020   K 4.6  07/20/2020   CL 104 07/20/2020   CO2 23 07/20/2020   Lab Results  Component Value Date   ALT 10 07/20/2020   AST 12 07/20/2020   ALKPHOS 68 07/20/2020   BILITOT <0.2 07/20/2020   Lab Results  Component Value Date   HGBA1C 6.1 (H) 07/20/2020   HGBA1C 4.5 (L) 03/18/2018   HGBA1C 5.5 01/16/2017   HGBA1C 5.9 12/28/2015   Lab Results  Component Value Date   INSULIN 25.6 (H) 07/20/2020   Lab Results  Component Value Date   TSH 2.100 07/20/2020   Lab Results  Component Value Date   CHOL 206 (H) 07/20/2020   HDL 49 07/20/2020   LDLCALC 134 (H) 07/20/2020   LDLDIRECT 110.0 12/28/2015   TRIG 131 07/20/2020   CHOLHDL 5 08/12/2019   Lab Results  Component Value Date   WBC 7.8 07/20/2020   HGB 11.9 07/20/2020   HCT 38.3 07/20/2020   MCV 85 07/20/2020   PLT 343 07/20/2020   Lab Results  Component Value Date   IRON 158 (H) 12/27/2014   TIBC 422 12/27/2014   FERRITIN 45 12/27/2014   Attestation Statements:   Reviewed by clinician on day of visit: allergies, medications, problem list, medical history, surgical history, family history, social history, and previous encounter notes.  I, Water quality scientist, CMA, am acting as transcriptionist for Coralie Common, MD.  I have reviewed the above documentation for accuracy and completeness, and I agree with the above. - Jinny Blossom, MD

## 2020-09-14 ENCOUNTER — Ambulatory Visit: Payer: 59 | Admitting: Psychology

## 2020-09-21 ENCOUNTER — Ambulatory Visit (INDEPENDENT_AMBULATORY_CARE_PROVIDER_SITE_OTHER): Payer: 59 | Admitting: Adult Health

## 2020-09-26 ENCOUNTER — Encounter (INDEPENDENT_AMBULATORY_CARE_PROVIDER_SITE_OTHER): Payer: Self-pay

## 2020-09-27 ENCOUNTER — Encounter (INDEPENDENT_AMBULATORY_CARE_PROVIDER_SITE_OTHER): Payer: Self-pay

## 2020-09-28 ENCOUNTER — Other Ambulatory Visit: Payer: Self-pay | Admitting: Family Medicine

## 2020-09-28 ENCOUNTER — Ambulatory Visit (INDEPENDENT_AMBULATORY_CARE_PROVIDER_SITE_OTHER): Payer: 59 | Admitting: Family Medicine

## 2020-09-28 ENCOUNTER — Other Ambulatory Visit (INDEPENDENT_AMBULATORY_CARE_PROVIDER_SITE_OTHER): Payer: Self-pay | Admitting: Family Medicine

## 2020-09-28 ENCOUNTER — Other Ambulatory Visit: Payer: Self-pay

## 2020-09-28 ENCOUNTER — Encounter: Payer: Self-pay | Admitting: Family Medicine

## 2020-09-28 ENCOUNTER — Ambulatory Visit (INDEPENDENT_AMBULATORY_CARE_PROVIDER_SITE_OTHER): Payer: 59 | Admitting: Adult Health

## 2020-09-28 VITALS — BP 130/84 | HR 90 | Temp 97.3°F | Resp 20 | Ht 61.0 in | Wt 214.8 lb

## 2020-09-28 DIAGNOSIS — R519 Headache, unspecified: Secondary | ICD-10-CM

## 2020-09-28 DIAGNOSIS — Z0001 Encounter for general adult medical examination with abnormal findings: Secondary | ICD-10-CM | POA: Diagnosis not present

## 2020-09-28 DIAGNOSIS — Z0000 Encounter for general adult medical examination without abnormal findings: Secondary | ICD-10-CM

## 2020-09-28 DIAGNOSIS — Z23 Encounter for immunization: Secondary | ICD-10-CM

## 2020-09-28 DIAGNOSIS — E559 Vitamin D deficiency, unspecified: Secondary | ICD-10-CM

## 2020-09-28 DIAGNOSIS — Z1231 Encounter for screening mammogram for malignant neoplasm of breast: Secondary | ICD-10-CM | POA: Diagnosis not present

## 2020-09-28 MED ORDER — PROPRANOLOL HCL ER 60 MG PO CP24: 60.0000 mg | ORAL_CAPSULE | Freq: Every day | ORAL | 3 refills | Status: DC

## 2020-09-28 NOTE — Patient Instructions (Addendum)
Follow up in 1 year or as needed No need for labs today Continue to work on healthy diet and regular exercise START the Propranolol once daily to decrease your headaches Call with any questions or concerns Stay Safe!  Stay Healthy!

## 2020-09-28 NOTE — Assessment & Plan Note (Signed)
Ongoing issue.  Following w/ [facility].  Reviewed recent labs.  Will follow along.

## 2020-09-28 NOTE — Progress Notes (Signed)
   Subjective:    Patient ID: Cynthia [name], female    DOB: October 22, 1981, 39 y.o.   MRN: [id number]  HPI CPE- UTD on pap, due for mammo.  Due for flu.  Reviewed past medical, surgical, family and social histories.   Worsening HAs- has always had issues w/ headaches but they are occurring much more regularly.  Taking ibuprofen 3-5x/week.   Review of Systems Patient reports no vision/ hearing changes, adenopathy,fever, weight change,  persistant/recurrent hoarseness , swallowing issues, chest pain, palpitations, edema, persistant/recurrent cough, hemoptysis, dyspnea (rest/exertional/paroxysmal nocturnal), gastrointestinal bleeding (melena, rectal bleeding), abdominal pain, significant heartburn, bowel changes, GU symptoms (dysuria, hematuria, incontinence), Gyn symptoms (abnormal  bleeding, pain),  syncope, focal weakness, memory loss, numbness & tingling, skin/hair/nail changes, abnormal bruising or bleeding, anxiety, or depression.   This visit occurred during the SARS-CoV-2 public health emergency.  Safety protocols were in place, including screening questions prior to the visit, additional usage of staff PPE, and extensive cleaning of exam room while observing appropriate contact time as indicated for disinfecting solutions.       Objective:   Physical Exam General Appearance:    Alert, cooperative, no distress, appears stated age, obese  Head:    Normocephalic, without obvious abnormality, atraumatic  Eyes:    PERRL, conjunctiva/corneas clear, EOM's intact, fundi    benign, both eyes  Ears:    Normal TM's and external ear canals, both ears  Nose:   Deferred due to COVID  Throat:   Neck:   Supple, symmetrical, trachea midline, no adenopathy;    Thyroid: no enlargement/tenderness/nodules  Back:     Symmetric, no curvature, ROM normal, no CVA tenderness  Lungs:     Clear to auscultation bilaterally, respirations unlabored  Chest Wall:    No tenderness or deformity   Heart:     Regular rate and rhythm, S1 and S2 normal, no murmur, rub   or gallop  Breast Exam:    Deferred to GYN  Abdomen:     Soft, non-tender, bowel sounds active all four quadrants,    no masses, no organomegaly  Genitalia:    Deferred to GYN  Rectal:    Extremities:   Extremities normal, atraumatic, no cyanosis or edema  Pulses:   2+ and symmetric all extremities  Skin:   Skin color, texture, turgor normal, no rashes or lesions  Lymph nodes:   Cervical, supraclavicular, and axillary nodes normal  Neurologic:   CNII-XII intact, normal strength, sensation and reflexes    throughout          Assessment & Plan:

## 2020-09-28 NOTE — Assessment & Plan Note (Signed)
Pt's PE WNL w/ exception of obesity.  UTD on pap, due for mammo.  Flu shot given.  UTD on COVID and Tdap.  Anticipatory guidance provided.

## 2020-09-28 NOTE — Assessment & Plan Note (Signed)
New.  Pt reports she has always had headaches but recently they have been much more frequent and she finds herself taking ibuprofen 3-5x/week.  She is interested in a medication to decrease frequency.  Discussed Topamax but the cognitive side effects would be very difficult while home w/ 40 yr old twins.  Instead will start Propranolol and monitor for improvement.  Pt expressed understanding and is in agreement w/ plan.

## 2020-09-29 ENCOUNTER — Other Ambulatory Visit: Payer: Self-pay | Admitting: Family Medicine

## 2020-09-29 ENCOUNTER — Encounter (INDEPENDENT_AMBULATORY_CARE_PROVIDER_SITE_OTHER): Payer: Self-pay

## 2020-09-29 NOTE — Telephone Encounter (Signed)
Message sent to pt-[facility] 

## 2020-10-04 ENCOUNTER — Other Ambulatory Visit (HOSPITAL_COMMUNITY): Payer: 59

## 2020-10-05 ENCOUNTER — Other Ambulatory Visit: Payer: Self-pay

## 2020-10-05 ENCOUNTER — Ambulatory Visit (INDEPENDENT_AMBULATORY_CARE_PROVIDER_SITE_OTHER): Payer: 59 | Admitting: Family Medicine

## 2020-10-05 ENCOUNTER — Encounter (INDEPENDENT_AMBULATORY_CARE_PROVIDER_SITE_OTHER): Payer: Self-pay | Admitting: Family Medicine

## 2020-10-05 VITALS — BP 137/89 | Temp 98.2°F | Ht 61.0 in | Wt 212.0 lb

## 2020-10-05 DIAGNOSIS — Z9189 Other specified personal risk factors, not elsewhere classified: Secondary | ICD-10-CM

## 2020-10-05 DIAGNOSIS — R632 Polyphagia: Secondary | ICD-10-CM

## 2020-10-05 DIAGNOSIS — E559 Vitamin D deficiency, unspecified: Secondary | ICD-10-CM | POA: Diagnosis not present

## 2020-10-05 DIAGNOSIS — R7303 Prediabetes: Secondary | ICD-10-CM

## 2020-10-05 DIAGNOSIS — Z6838 Body mass index (BMI) 38.0-38.9, adult: Secondary | ICD-10-CM | POA: Diagnosis not present

## 2020-10-05 MED ORDER — TROKENDI XR 50 MG PO CP24: 1.0000 | ORAL_CAPSULE | Freq: Every day | ORAL | 0 refills | Status: DC

## 2020-10-08 NOTE — Progress Notes (Signed)
Chief Complaint:   OBESITY Cynthia [name] is here to discuss her progress with her obesity treatment plan along with follow-up of her obesity related diagnoses.   Today's visit was #: 5 Starting weight: 223 lbs Starting date: 07/20/2020 Today's weight: 212 lbs Today's date: 10/05/2020 Total lbs lost to date: 11 lbs Body mass index is 40.06 kg/m.  Total weight loss percentage to date: -4.93%  Interim History: Cynthia [name] says that this is her "birthday month".  She will be spinning again with 100-year-old twins in preschool.  She reports that she has been keeping track of food.  Denies hunger.  She had a CPE last week.  She was started on a beta blocker for headaches.  Nutrition Plan: Category 3 Plan for 70% of the time. Activity: Taking care of twins.  Assessment/Plan:   1. Vitamin D deficiency Current vitamin D is 16.3, tested on 07/20/2020. Not at goal. Optimal goal > 50 ng/dL.   Plan:  [x]   Continue Vitamin D @50 ,000 IU every week. []   Continue home supplement daily. [x]   Follow-up for routine testing of Vitamin D at least 2-3 times per year to avoid over-replacement. []   Monitored by PCP.  2. Prediabetes Goal is HgbA1c < 5.7 and insulin level closer to 5.  She is taking metformin 500 mg daily. She will continue to focus on protein-rich, low simple carbohydrate foods. We reviewed the importance of hydration, regular exercise for stress reduction, and restorative sleep.   Lab Results  Component Value Date   HGBA1C 6.1 (H) 07/20/2020   Lab Results  Component Value Date   INSULIN 25.6 (H) 07/20/2020   3. Polyphagia Not controlled. Since Cynthia [name] is also struggling with migraines, we discussed transitioning her to Topiramate. Hopefully, this will also help with cravings and emotional eating.   - Start Topiramate ER (TROKENDI XR) 50 MG CP24; Take 1 tablet by mouth at bedtime.  Dispense: 30 capsule; Refill: 0  4. At risk for side effect of medication Cynthia [name] was given approximately 8  minutes of drug side effect counseling today.  We discussed side effect possibility and risk versus benefits. Cynthia [name] agreed to the medication and will contact this office if these side effects are intolerable.  5. Class 2 severe obesity with serious comorbidity and body mass index (BMI) of 38.0 to 38.9 in adult, unspecified obesity type (Cynthia [name])  Cynthia [name] is currently in the action stage of change. As such, her goal is to continue with weight loss efforts.   Nutrition goals: She has agreed to the Category 3 Plan.   Exercise goals: For substantial health benefits, adults should do at least 150 minutes (2 hours and 30 minutes) a week of moderate-intensity, or 75 minutes (1 hour and 15 minutes) a week of vigorous-intensity aerobic physical activity, or an equivalent combination of moderate- and vigorous-intensity aerobic activity. Aerobic activity should be performed in episodes of at least 10 minutes, and preferably, it should be spread throughout the week.  Behavioral modification strategies: increasing lean protein intake, decreasing simple carbohydrates, increasing vegetables, increasing water intake and decreasing liquid calories.  Cynthia [name] has agreed to follow-up with our clinic in 2-3 weeks. She was informed of the importance of frequent follow-up visits to maximize her success with intensive lifestyle modifications for her multiple health conditions.   Objective:   Blood pressure 137/89, temperature 98.2 F (36.8 C), height 5\' 1"  (1.549 m), weight 212 lb (96.2 kg). Body mass index is 40.06 kg/m.  General: Cooperative, alert, well developed, in no  acute distress. HEENT: Conjunctivae and lids unremarkable. Cardiovascular: Regular rhythm.  Lungs: Normal work of breathing. Neurologic: No focal deficits.   Lab Results  Component Value Date   CREATININE 0.65 07/20/2020   BUN 13 07/20/2020   NA 140 07/20/2020   K 4.6 07/20/2020   CL 104 07/20/2020   CO2 23 07/20/2020   Lab Results    Component Value Date   ALT 10 07/20/2020   AST 12 07/20/2020   ALKPHOS 68 07/20/2020   BILITOT <0.2 07/20/2020   Lab Results  Component Value Date   HGBA1C 6.1 (H) 07/20/2020   HGBA1C 4.5 (L) 03/18/2018   HGBA1C 5.5 01/16/2017   HGBA1C 5.9 12/28/2015   Lab Results  Component Value Date   INSULIN 25.6 (H) 07/20/2020   Lab Results  Component Value Date   TSH 2.100 07/20/2020   Lab Results  Component Value Date   CHOL 206 (H) 07/20/2020   HDL 49 07/20/2020   LDLCALC 134 (H) 07/20/2020   LDLDIRECT 110.0 12/28/2015   TRIG 131 07/20/2020   CHOLHDL 5 08/12/2019   Lab Results  Component Value Date   WBC 7.8 07/20/2020   HGB 11.9 07/20/2020   HCT 38.3 07/20/2020   MCV 85 07/20/2020   PLT 343 07/20/2020   Lab Results  Component Value Date   IRON 158 (H) 12/27/2014   TIBC 422 12/27/2014   FERRITIN 45 12/27/2014   Attestation Statements:   Reviewed by clinician on day of visit: allergies, medications, problem list, medical history, surgical history, family history, social history, and previous encounter notes.  I, Water quality scientist, CMA, am acting as transcriptionist for Briscoe Deutscher, DO  I have reviewed the above documentation for accuracy and completeness, and I agree with the above. Briscoe Deutscher, DO

## 2020-10-12 ENCOUNTER — Ambulatory Visit (INDEPENDENT_AMBULATORY_CARE_PROVIDER_SITE_OTHER): Payer: 59 | Admitting: Psychology

## 2020-10-12 DIAGNOSIS — F411 Generalized anxiety disorder: Secondary | ICD-10-CM

## 2020-10-17 ENCOUNTER — Telehealth (HOSPITAL_COMMUNITY): Payer: Self-pay | Admitting: Family Medicine

## 2020-10-17 ENCOUNTER — Other Ambulatory Visit (INDEPENDENT_AMBULATORY_CARE_PROVIDER_SITE_OTHER): Payer: Self-pay | Admitting: Family Medicine

## 2020-10-17 ENCOUNTER — Other Ambulatory Visit (INDEPENDENT_AMBULATORY_CARE_PROVIDER_SITE_OTHER): Payer: Self-pay

## 2020-10-17 DIAGNOSIS — E559 Vitamin D deficiency, unspecified: Secondary | ICD-10-CM

## 2020-10-17 MED ORDER — VITAMIN D (ERGOCALCIFEROL) 1.25 MG (50000 UNIT) PO CAPS: 50000.0000 [IU] | ORAL_CAPSULE | ORAL | 0 refills | Status: DC

## 2020-10-17 NOTE — Telephone Encounter (Signed)
Patient called and cancelled echocardiogram that you ordered and does not wish to reschedule at this time. Order will be removed from the Echo WQ and if patient calls back to reschedule we will reinstate the order or create anew one. Thank you.

## 2020-10-19 ENCOUNTER — Other Ambulatory Visit (HOSPITAL_COMMUNITY): Payer: 59

## 2020-10-26 ENCOUNTER — Ambulatory Visit (INDEPENDENT_AMBULATORY_CARE_PROVIDER_SITE_OTHER): Payer: 59 | Admitting: Family Medicine

## 2020-11-02 ENCOUNTER — Other Ambulatory Visit: Payer: Self-pay

## 2020-11-02 ENCOUNTER — Ambulatory Visit
Admission: RE | Admit: 2020-11-02 | Discharge: 2020-11-02 | Disposition: A | Discharge status: Home / Self Care | Payer: 59 | Source: Ambulatory Visit | Attending: Family Medicine | Admitting: Family Medicine

## 2020-11-02 ENCOUNTER — Ambulatory Visit (INDEPENDENT_AMBULATORY_CARE_PROVIDER_SITE_OTHER): Payer: 59 | Admitting: Family Medicine

## 2020-11-02 DIAGNOSIS — Z1231 Encounter for screening mammogram for malignant neoplasm of breast: Secondary | ICD-10-CM | POA: Diagnosis not present

## 2020-11-07 ENCOUNTER — Other Ambulatory Visit (INDEPENDENT_AMBULATORY_CARE_PROVIDER_SITE_OTHER): Payer: Self-pay | Admitting: Family Medicine

## 2020-11-07 ENCOUNTER — Encounter (INDEPENDENT_AMBULATORY_CARE_PROVIDER_SITE_OTHER): Payer: Self-pay

## 2020-11-07 DIAGNOSIS — R632 Polyphagia: Secondary | ICD-10-CM

## 2020-11-07 NOTE — Telephone Encounter (Signed)
Message sent to pt.

## 2020-11-09 ENCOUNTER — Other Ambulatory Visit (INDEPENDENT_AMBULATORY_CARE_PROVIDER_SITE_OTHER): Payer: Self-pay | Admitting: Family Medicine

## 2020-11-14 ENCOUNTER — Encounter (INDEPENDENT_AMBULATORY_CARE_PROVIDER_SITE_OTHER): Payer: Self-pay | Admitting: Family Medicine

## 2020-11-14 ENCOUNTER — Ambulatory Visit (INDEPENDENT_AMBULATORY_CARE_PROVIDER_SITE_OTHER): Payer: 59 | Admitting: Family Medicine

## 2020-11-14 ENCOUNTER — Other Ambulatory Visit (INDEPENDENT_AMBULATORY_CARE_PROVIDER_SITE_OTHER): Payer: Self-pay | Admitting: Family Medicine

## 2020-11-14 ENCOUNTER — Other Ambulatory Visit: Payer: Self-pay

## 2020-11-14 VITALS — BP 117/83 | HR 73 | Temp 98.0°F | Ht 61.0 in | Wt 207.0 lb

## 2020-11-14 DIAGNOSIS — Z6839 Body mass index (BMI) 39.0-39.9, adult: Secondary | ICD-10-CM | POA: Diagnosis not present

## 2020-11-14 DIAGNOSIS — E66812 Obesity, class 2: Secondary | ICD-10-CM

## 2020-11-14 DIAGNOSIS — E559 Vitamin D deficiency, unspecified: Secondary | ICD-10-CM | POA: Diagnosis not present

## 2020-11-14 DIAGNOSIS — Z9189 Other specified personal risk factors, not elsewhere classified: Secondary | ICD-10-CM | POA: Diagnosis not present

## 2020-11-14 MED ORDER — VITAMIN D (ERGOCALCIFEROL) 1.25 MG (50000 UNIT) PO CAPS: 50000.0000 [IU] | ORAL_CAPSULE | ORAL | 0 refills | Status: DC

## 2020-11-14 NOTE — Addendum Note (Signed)
Addended by: Marjory Sneddon on: 11/14/2020 10:32 AM   Modules accepted: Orders

## 2020-11-14 NOTE — Addendum Note (Signed)
Addended by: Neomia Dear on: 11/14/2020 10:36 AM   Modules accepted: Orders

## 2020-11-15 NOTE — Progress Notes (Signed)
Chief Complaint:   OBESITY Cynthia [name] is here to discuss her progress with her obesity treatment plan along with follow-up of her obesity related diagnoses. Cynthia [name] is on the Category 3 Plan and states she is following her eating plan approximately 80-85% of the time. Cynthia [name] states she is doing spin class 30 minutes 2 times per week.  Today's visit was #: 6 Starting weight: 223 lbs Starting date: 07/20/2020 Today's weight: 207 lbs Today's date: 11/14/2020 Total lbs lost to date: 16 lbs Total lbs lost since last in-office visit: 5 lbs Total weight loss percentage to date: -7.17%  Interim History: This is my first visit with Sean, as she was previously seen by Dr.Wallace 5-6 weeks ago. She is using the MFP for logging but using Category 3 as a guide only. She notes things are going well with no issues.   Assessment/Plan:   1. Vitamin D deficiency Cynthia [name]'s Vitamin D level was 16.3 on 07/20/2020. She is currently taking prescription vitamin D 50,000 IU each week. She denies nausea, vomiting or muscle weakness.  Plan:Refill Vit D for 1 month, as per below. Low Vitamin D level contributes to fatigue and are associated with obesity, breast, and colon cancer. She agrees to continue to take prescription Vitamin D @50 ,000 IU every week and will follow-up for routine testing of Vitamin D, at least 2-3 times per year to avoid over-replacement.  Refill- Vitamin D, Ergocalciferol, (DRISDOL) 1.25 MG (50000 UNIT) CAPS capsule; Take 1 capsule (50,000 Units total) by mouth every 7 (seven) days.  Dispense: 4 capsule; Refill: 0  2. At risk for deficient intake of food Cynthia [name] was given approximately 18 minutes of deficit intake of food prevention counseling today. Cynthia [name] is at risk for eating too few calories based on current food recall. She was encouraged to focus on meeting caloric and protein goals according to her recommended meal plan.   3. Class 2 severe obesity with serious comorbidity and body mass  index (BMI) of 39.0 to 39.9 in adult, unspecified obesity type ([place]) Cynthia [name] is currently in the action stage of change. As such, her goal is to continue with weight loss efforts. She has agreed to change to keeping a food journal and adhering to recommended goals of 1300-1400 calories and 100 g protein daily.   Exercise goals: As is  Behavioral modification strategies: increasing lean protein intake, no skipping meals, meal planning and cooking strategies and keeping healthy foods in the home.  Cynthia [name] has agreed to follow-up with our clinic in 2 weeks. She was informed of the importance of frequent follow-up visits to maximize her success with intensive lifestyle modifications for her multiple health conditions.   Objective:   Blood pressure 117/83, pulse 73, temperature 98 F (36.7 C), height 5\' 1"  (1.549 m), weight 207 lb (93.9 kg), SpO2 97 %. Body mass index is 39.11 kg/m.  General: Cooperative, alert, well developed, in no acute distress. HEENT: Conjunctivae and lids unremarkable. Cardiovascular: Regular rhythm.  Lungs: Normal work of breathing. Neurologic: No focal deficits.   Lab Results  Component Value Date   CREATININE 0.65 07/20/2020   BUN 13 07/20/2020   NA 140 07/20/2020   K 4.6 07/20/2020   CL 104 07/20/2020   CO2 23 07/20/2020   Lab Results  Component Value Date   ALT 10 07/20/2020   AST 12 07/20/2020   ALKPHOS 68 07/20/2020   BILITOT <0.2 07/20/2020   Lab Results  Component Value Date   HGBA1C 6.1 (H) 07/20/2020  HGBA1C 4.5 (L) 03/18/2018   HGBA1C 5.5 01/16/2017   HGBA1C 5.9 12/28/2015   Lab Results  Component Value Date   INSULIN 25.6 (H) 07/20/2020   Lab Results  Component Value Date   TSH 2.100 07/20/2020   Lab Results  Component Value Date   CHOL 206 (H) 07/20/2020   HDL 49 07/20/2020   LDLCALC 134 (H) 07/20/2020   LDLDIRECT 110.0 12/28/2015   TRIG 131 07/20/2020   CHOLHDL 5 08/12/2019   Lab Results  Component Value Date   WBC 7.8  07/20/2020   HGB 11.9 07/20/2020   HCT 38.3 07/20/2020   MCV 85 07/20/2020   PLT 343 07/20/2020   Lab Results  Component Value Date   IRON 158 (H) 12/27/2014   TIBC 422 12/27/2014   FERRITIN 45 12/27/2014   Attestation Statements:   Reviewed by clinician on day of visit: allergies, medications, problem list, medical history, surgical history, family history, social history, and previous encounter notes.  Coral Ceo, am acting as Location manager for [employer], DO.  I have reviewed the above documentation for accuracy and completeness, and I agree with the above. Marjory Sneddon, D.O.  [place] was signed into law in 2016 which includes the topic of electronic health records.  This provides immediate access to information in MyChart.  This includes consultation notes, operative notes, office notes, lab results and pathology reports.  If you have any questions about what you read please let us know at your next visit so we can discuss your concerns and take corrective action if need be.  We are right here with you.

## 2020-11-30 ENCOUNTER — Ambulatory Visit (INDEPENDENT_AMBULATORY_CARE_PROVIDER_SITE_OTHER): Payer: 59 | Admitting: Family Medicine

## 2020-11-30 ENCOUNTER — Encounter (INDEPENDENT_AMBULATORY_CARE_PROVIDER_SITE_OTHER): Payer: Self-pay | Admitting: Family Medicine

## 2020-11-30 ENCOUNTER — Other Ambulatory Visit (INDEPENDENT_AMBULATORY_CARE_PROVIDER_SITE_OTHER): Payer: Self-pay | Admitting: Family Medicine

## 2020-11-30 ENCOUNTER — Other Ambulatory Visit: Payer: Self-pay

## 2020-11-30 ENCOUNTER — Ambulatory Visit (INDEPENDENT_AMBULATORY_CARE_PROVIDER_SITE_OTHER): Payer: 59 | Admitting: Psychology

## 2020-11-30 VITALS — BP 131/90 | HR 77 | Temp 98.1°F | Ht 61.0 in | Wt 204.0 lb

## 2020-11-30 DIAGNOSIS — F411 Generalized anxiety disorder: Secondary | ICD-10-CM

## 2020-11-30 DIAGNOSIS — E559 Vitamin D deficiency, unspecified: Secondary | ICD-10-CM | POA: Diagnosis not present

## 2020-11-30 DIAGNOSIS — R7303 Prediabetes: Secondary | ICD-10-CM | POA: Diagnosis not present

## 2020-11-30 DIAGNOSIS — Z6838 Body mass index (BMI) 38.0-38.9, adult: Secondary | ICD-10-CM | POA: Diagnosis not present

## 2020-11-30 DIAGNOSIS — R632 Polyphagia: Secondary | ICD-10-CM | POA: Diagnosis not present

## 2020-11-30 DIAGNOSIS — Z9189 Other specified personal risk factors, not elsewhere classified: Secondary | ICD-10-CM

## 2020-11-30 MED ORDER — TROKENDI XR 50 MG PO CP24: 1.0000 | ORAL_CAPSULE | Freq: Every day | ORAL | 0 refills | Status: DC

## 2020-11-30 MED ORDER — VITAMIN D (ERGOCALCIFEROL) 1.25 MG (50000 UNIT) PO CAPS: 50000.0000 [IU] | ORAL_CAPSULE | ORAL | 0 refills | Status: DC

## 2020-11-30 NOTE — Progress Notes (Signed)
Chief Complaint:   OBESITY Geanna is here to discuss her progress with her obesity treatment plan along with follow-up of her obesity related diagnoses. Ambriana is on keeping a food journal and adhering to recommended goals of 1300-1400 calories and 100 grams of protein and states she is following her eating plan approximately 90% of the time. Ellana states she is spin class for 30 minutes 2 times per week.  Today's visit was #: 7 Starting weight: 223 lbs Starting date: 07/20/2020 Today's weight: 204 lbs Today's date: 11/30/2020 Total lbs lost to date: 19 lbs Total lbs lost since last in-office visit: 3 lbs  Interim History: Kalyani is doing very well on the plan.  She is down 19 pounds since August 2021.  She is journaling consistently and meeting protein and calorie goals.  Hunger is satisfied.  She is having a [employer] protein shake most days for breakfast.  Subjective:   1. Vitamin D deficiency Vitamin D very low @ 16.3.  On weekly prescription vitamin D.  2. Polyphagia She is on Trokendi for migraine prophylaxis and night cravings.  She feels it is helping with cravings and she notes less migraines.  Notes some paresthesias in her fingers (SE of Trokendi).  Denies dysgeusia.   3. Pre-diabetes On metformin.  Last A1c was 6.1.  She reports it jumped form 4.5 to 6.1 in 1 year.  Positive family history of diabetes mellitus.  She is tolerating metformin well.  Lab Results  Component Value Date   HGBA1C 6.1 (H) 07/20/2020   Lab Results  Component Value Date   INSULIN 25.6 (H) 07/20/2020   4. At risk for diabetes mellitus Lucette is at higher than average risk for developing diabetes due to strong positive family history of diabetes, obesity, and prediabetes (A1c 6.1).   Assessment/Plan:   1. Vitamin D deficiency Will refill vitamin D 50,000 IU weekly, as per below.  - Vitamin D, Ergocalciferol, (DRISDOL) 1.25 MG (50000 UNIT) CAPS capsule; Take 1 capsule (50,000 Units  total) by mouth every 7 (seven) days.  Dispense: 4 capsule; Refill: 0  2. Polyphagia Refill Trokendi 50 mg.   - Topiramate ER (TROKENDI XR) 50 MG CP24; Take 1 tablet by mouth at bedtime.  Dispense: 30 capsule; Refill: 0  3. Pre-diabetes Continue metformin and meal plan.  4. At risk for diabetes mellitus Jenesa was given approximately 15 minutes of diabetes education and counseling today. We discussed intensive lifestyle modifications today with an emphasis on weight loss as well as increasing exercise and decreasing simple carbohydrates in her diet. We also reviewed medication options with an emphasis on risk versus benefit of those discussed.   Repetitive spaced learning was employed today to elicit superior memory formation and behavioral change.  5. Class 2 severe obesity with serious comorbidity and body mass index (BMI) of 38.0 to 38.9 in adult, unspecified obesity type ([place])  Mashell is currently in the action stage of change. As such, her goal is to continue with weight loss efforts. She has agreed to keeping a food journal and adhering to recommended goals of 1300-1400 calories and 100 grams of protein.   Exercise goals: As is.  Behavioral modification strategies: meal planning and cooking strategies and planning for success.  Charmaine has agreed to follow-up with our clinic in 2-3 weeks.  Objective:   Blood pressure 131/90, pulse 77, temperature 98.1 F (36.7 C), height 5\' 1"  (1.549 m), weight 204 lb (92.5 kg), SpO2 99 %. Body mass index  is 38.55 kg/m.  General: Cooperative, alert, well developed, in no acute distress. HEENT: Conjunctivae and lids unremarkable. Cardiovascular: Regular rhythm.  Lungs: Normal work of breathing. Neurologic: No focal deficits.   Lab Results  Component Value Date   CREATININE 0.65 07/20/2020   BUN 13 07/20/2020   NA 140 07/20/2020   K 4.6 07/20/2020   CL 104 07/20/2020   CO2 23 07/20/2020   Lab Results  Component Value Date   ALT 10  07/20/2020   AST 12 07/20/2020   ALKPHOS 68 07/20/2020   BILITOT <0.2 07/20/2020   Lab Results  Component Value Date   HGBA1C 6.1 (H) 07/20/2020   HGBA1C 4.5 (L) 03/18/2018   HGBA1C 5.5 01/16/2017   HGBA1C 5.9 12/28/2015   Lab Results  Component Value Date   INSULIN 25.6 (H) 07/20/2020   Lab Results  Component Value Date   TSH 2.100 07/20/2020   Lab Results  Component Value Date   CHOL 206 (H) 07/20/2020   HDL 49 07/20/2020   LDLCALC 134 (H) 07/20/2020   LDLDIRECT 110.0 12/28/2015   TRIG 131 07/20/2020   CHOLHDL 5 08/12/2019   Lab Results  Component Value Date   WBC 7.8 07/20/2020   HGB 11.9 07/20/2020   HCT 38.3 07/20/2020   MCV 85 07/20/2020   PLT 343 07/20/2020   Lab Results  Component Value Date   IRON 158 (H) 12/27/2014   TIBC 422 12/27/2014   FERRITIN 45 12/27/2014   Attestation Statements:   Reviewed by clinician on day of visit: allergies, medications, problem list, medical history, surgical history, family history, social history, and previous encounter notes.  I, Insurance claims handler, CMA, am acting as Energy manager for [place], FNP.  I have reviewed the above documentation for accuracy and completeness, and I agree with the above. -  Jesse Sans, FNP

## 2020-12-14 ENCOUNTER — Encounter (INDEPENDENT_AMBULATORY_CARE_PROVIDER_SITE_OTHER): Payer: Self-pay | Admitting: Family Medicine

## 2020-12-14 DIAGNOSIS — R7303 Prediabetes: Secondary | ICD-10-CM

## 2020-12-15 ENCOUNTER — Other Ambulatory Visit (INDEPENDENT_AMBULATORY_CARE_PROVIDER_SITE_OTHER): Payer: Self-pay | Admitting: Family Medicine

## 2020-12-15 MED ORDER — METFORMIN HCL 500 MG PO TABS: 500.0000 mg | ORAL_TABLET | Freq: Every day | ORAL | 0 refills | Status: DC

## 2020-12-15 NOTE — Telephone Encounter (Signed)
This patient was last seen by Adah Salvage, FNP and currently has an upcoming appt scheduled on 12/27/20 with her.

## 2020-12-21 ENCOUNTER — Other Ambulatory Visit: Payer: 59

## 2020-12-27 ENCOUNTER — Other Ambulatory Visit (INDEPENDENT_AMBULATORY_CARE_PROVIDER_SITE_OTHER): Payer: Self-pay | Admitting: Family Medicine

## 2020-12-27 ENCOUNTER — Encounter (INDEPENDENT_AMBULATORY_CARE_PROVIDER_SITE_OTHER): Payer: Self-pay | Admitting: Family Medicine

## 2020-12-27 ENCOUNTER — Telehealth (INDEPENDENT_AMBULATORY_CARE_PROVIDER_SITE_OTHER): Payer: 59 | Admitting: Family Medicine

## 2020-12-27 ENCOUNTER — Telehealth (INDEPENDENT_AMBULATORY_CARE_PROVIDER_SITE_OTHER): Payer: Self-pay

## 2020-12-27 DIAGNOSIS — F32A Depression, unspecified: Secondary | ICD-10-CM | POA: Diagnosis not present

## 2020-12-27 DIAGNOSIS — R632 Polyphagia: Secondary | ICD-10-CM | POA: Diagnosis not present

## 2020-12-27 DIAGNOSIS — Z6838 Body mass index (BMI) 38.0-38.9, adult: Secondary | ICD-10-CM

## 2020-12-27 DIAGNOSIS — F419 Anxiety disorder, unspecified: Secondary | ICD-10-CM

## 2020-12-27 MED ORDER — ESCITALOPRAM OXALATE 20 MG PO TABS: 20.0000 mg | ORAL_TABLET | Freq: Every day | ORAL | 1 refills | Status: DC

## 2020-12-27 NOTE — Telephone Encounter (Signed)
Verbal consent obtained to conduct video visit, via telehealth 

## 2020-12-29 NOTE — Progress Notes (Signed)
TeleHealth Visit:  Due to the COVID-19 pandemic, this visit was completed with telemedicine (audio/video) technology to reduce patient and provider exposure as well as to preserve personal protective equipment.   Naomie has verbally consented to this TeleHealth visit. The patient is located at home, the provider is located at the [facility] office. The participants in this visit include the listed provider and patient. The visit was conducted today via MyChart video.  Chief Complaint: OBESITY Blinda is here to discuss her progress with her obesity treatment plan along with follow-up of her obesity related diagnoses. Shiah is on keeping a food journal and adhering to recommended goals of 1300-1400 calories and 100 grams of protein.  Today's visit was #: 8 Starting weight: 223 lbs Starting date: 07/20/2020  Interim History: Yulieth feels that she maintained her weight over Christmas.  She is now completely back on track.  She is getting a Peloton this week and is very excited about that.  She has tweaked her meals. Lately she has been enjoying homemade lentil soup.  She is journaling consistently and averaging 1300-1400 calories per day. Protein averages 85-90 grams per day.  Denies excessive cravings.  Subjective:   1. Polyphagia Appetite and cravings well controlled on Trokendi.  Notes paresthesias in fingers and toes and some dysgeusia.  She feels it flattens her mood a little, which is okay with her.  Some word-finding difficulty.  She wishes to continue Trokendi.  She notes benefits outweigh side effects.  She feels it helps with prophylaxis for migraines.   2. Anxiety and depression She asks for a refill of escitalopram, usually prescribed by her PCP.  Notes anxiety and depression are well-controlled.  Assessment/Plan:   1. Polyphagia Continue Trokendi.  2. Anxiety and depression Will refill Lexapro 20 mg daily, as per below.  -Refill escitalopram (LEXAPRO) 20  MG tablet; Take 1 tablet (20 mg total) by mouth daily.  Dispense: 90 tablet; Refill: 1  3. Class 2 severe obesity with serious comorbidity and body mass index (BMI) of 38.0 to 38.9 in adult, unspecified obesity type ([place])  Marlena is currently in the action stage of change. As such, her goal is to continue with weight loss efforts. She has agreed to keeping a food journal and adhering to recommended goals of 1300-1400 calories and 100 grams of protein daily.   Exercise goals: Will start Peloton this week.  Behavioral modification strategies: keeping a strict food journal.  Jacqualyn has agreed to follow-up with our clinic in 2-3 weeks.   Objective:   VITALS: Per patient if applicable, see vitals. GENERAL: Alert and in no acute distress. CARDIOPULMONARY: No increased WOB. Speaking in clear sentences.  PSYCH: Pleasant and cooperative. Speech normal rate and rhythm. Affect is appropriate. Insight and judgement are appropriate. Attention is focused, linear, and appropriate.  NEURO: Oriented as arrived to appointment on time with no prompting.   Lab Results  Component Value Date   CREATININE 0.65 07/20/2020   BUN 13 07/20/2020   NA 140 07/20/2020   K 4.6 07/20/2020   CL 104 07/20/2020   CO2 23 07/20/2020   Lab Results  Component Value Date   ALT 10 07/20/2020   AST 12 07/20/2020   ALKPHOS 68 07/20/2020   BILITOT <0.2 07/20/2020   Lab Results  Component Value Date   HGBA1C 6.1 (H) 07/20/2020   HGBA1C 4.5 (L) 03/18/2018   HGBA1C 5.5 01/16/2017   HGBA1C 5.9 12/28/2015   Lab Results  Component Value  Date   INSULIN 25.6 (H) 07/20/2020   Lab Results  Component Value Date   TSH 2.100 07/20/2020   Lab Results  Component Value Date   CHOL 206 (H) 07/20/2020   HDL 49 07/20/2020   LDLCALC 134 (H) 07/20/2020   LDLDIRECT 110.0 12/28/2015   TRIG 131 07/20/2020   CHOLHDL 5 08/12/2019   Lab Results  Component Value Date   WBC 7.8 07/20/2020   HGB 11.9 07/20/2020   HCT 38.3  07/20/2020   MCV 85 07/20/2020   PLT 343 07/20/2020   Lab Results  Component Value Date   IRON 158 (H) 12/27/2014   TIBC 422 12/27/2014   FERRITIN 45 12/27/2014   Attestation Statements:   Reviewed by clinician on day of visit: allergies, medications, problem list, medical history, surgical history, family history, social history, and previous encounter notes.  I, Water quality scientist, CMA, am acting as Location manager for [employer], [place].  I have reviewed the above documentation for accuracy and completeness, and I agree with the above. - Georgianne Fick, FNP

## 2020-12-31 ENCOUNTER — Encounter (INDEPENDENT_AMBULATORY_CARE_PROVIDER_SITE_OTHER): Payer: Self-pay | Admitting: Family Medicine

## 2020-12-31 DIAGNOSIS — F419 Anxiety disorder, unspecified: Secondary | ICD-10-CM | POA: Insufficient documentation

## 2020-12-31 DIAGNOSIS — F32A Depression, unspecified: Secondary | ICD-10-CM | POA: Insufficient documentation

## 2021-01-11 ENCOUNTER — Ambulatory Visit (INDEPENDENT_AMBULATORY_CARE_PROVIDER_SITE_OTHER): Payer: 59 | Admitting: Psychology

## 2021-01-11 DIAGNOSIS — F411 Generalized anxiety disorder: Secondary | ICD-10-CM | POA: Diagnosis not present

## 2021-01-12 ENCOUNTER — Other Ambulatory Visit (INDEPENDENT_AMBULATORY_CARE_PROVIDER_SITE_OTHER): Payer: Self-pay | Admitting: Family Medicine

## 2021-01-12 DIAGNOSIS — R632 Polyphagia: Secondary | ICD-10-CM

## 2021-01-12 NOTE — Telephone Encounter (Signed)
Refill request

## 2021-01-12 NOTE — Telephone Encounter (Signed)
Last seen Dawn 

## 2021-01-18 ENCOUNTER — Other Ambulatory Visit: Payer: Self-pay

## 2021-01-18 ENCOUNTER — Encounter (INDEPENDENT_AMBULATORY_CARE_PROVIDER_SITE_OTHER): Payer: Self-pay | Admitting: Family Medicine

## 2021-01-18 ENCOUNTER — Ambulatory Visit (INDEPENDENT_AMBULATORY_CARE_PROVIDER_SITE_OTHER): Payer: 59 | Admitting: Family Medicine

## 2021-01-18 VITALS — BP 137/82 | HR 54 | Temp 98.4°F | Ht 61.0 in | Wt 206.0 lb

## 2021-01-18 DIAGNOSIS — Z9189 Other specified personal risk factors, not elsewhere classified: Secondary | ICD-10-CM

## 2021-01-18 DIAGNOSIS — R632 Polyphagia: Secondary | ICD-10-CM | POA: Diagnosis not present

## 2021-01-18 DIAGNOSIS — E559 Vitamin D deficiency, unspecified: Secondary | ICD-10-CM

## 2021-01-18 DIAGNOSIS — Z6838 Body mass index (BMI) 38.0-38.9, adult: Secondary | ICD-10-CM

## 2021-01-18 DIAGNOSIS — F3289 Other specified depressive episodes: Secondary | ICD-10-CM | POA: Diagnosis not present

## 2021-01-19 ENCOUNTER — Encounter (INDEPENDENT_AMBULATORY_CARE_PROVIDER_SITE_OTHER): Payer: Self-pay | Admitting: Family Medicine

## 2021-01-19 ENCOUNTER — Other Ambulatory Visit (INDEPENDENT_AMBULATORY_CARE_PROVIDER_SITE_OTHER): Payer: Self-pay | Admitting: Family Medicine

## 2021-01-19 MED ORDER — INSULIN PEN NEEDLE 32G X 4 MM MISC: 0 refills | Status: DC

## 2021-01-19 MED ORDER — SAXENDA 18 MG/3ML ~~LOC~~ SOPN: 3.0000 mg | PEN_INJECTOR | Freq: Every day | SUBCUTANEOUS | 0 refills | Status: DC

## 2021-01-19 MED ORDER — PROMETHAZINE HCL 25 MG PO TABS: 25.0000 mg | ORAL_TABLET | Freq: Three times a day (TID) | ORAL | 0 refills | Status: DC | PRN

## 2021-01-19 MED ORDER — ONDANSETRON 4 MG PO TBDP: 4.0000 mg | ORAL_TABLET | Freq: Three times a day (TID) | ORAL | 0 refills | Status: DC | PRN

## 2021-01-19 MED ORDER — VITAMIN D (ERGOCALCIFEROL) 1.25 MG (50000 UNIT) PO CAPS: 50000.0000 [IU] | ORAL_CAPSULE | ORAL | 0 refills | Status: DC

## 2021-01-23 NOTE — Progress Notes (Signed)
Chief Complaint:   OBESITY Cynthia [name] is here to discuss her progress with her obesity treatment plan along with follow-up of her obesity related diagnoses.   Today's visit was #: 9 Starting weight: 223 lbs Starting date: 07/20/2020 Today's weight: 206 lbs Today's date: 01/18/2021 Total lbs lost to date: 17 lbs Body mass index is 38.92 kg/m.  Total weight loss percentage to date: -7.62%  Interim History: Cynthia [name] has been eating lentil soup.  She says she has been adding chicken for more protein. Nutrition Plan: keeping a food journal and adhering to recommended goals of 1300-1400 calories and 100 grams of protein daily for 60% of the time. Activity: Spin / Peloton for 25 minutes 4 times per week.  Assessment/Plan:   1. Vitamin D deficiency Not at goal. Current vitamin D is 16.3, tested on 07/20/2020. Optimal goal > 50 ng/dL. Plan: Continue to take prescription Vitamin D @50 ,000 IU every week as prescribed.  Follow-up for routine testing of Vitamin D, at least 2-3 times per year to avoid over-replacement.  - Refill Vitamin D, Ergocalciferol, (DRISDOL) 1.25 MG (50000 UNIT) CAPS capsule; Take 1 capsule (50,000 Units total) by mouth every 7 (seven) days.  Dispense: 4 capsule; Refill: 0  2. Polyphagia Current treatment: None. Polyphagia refers to excessive feelings of hunger. She will continue to focus on protein-rich, low simple carbohydrate foods.   Plan:  Will start [place] today.  See below under obesity diagnosis.  Due to the risk of nausea with Saxenda, will start Zofran and Phenergan, as per below.  We reviewed the importance of hydration, regular exercise for stress reduction, and restorative sleep.  - Start ondansetron (ZOFRAN ODT) 4 MG disintegrating tablet; Take 1 tablet (4 mg total) by mouth every 8 (eight) hours as needed for nausea or vomiting.  Dispense: 20 tablet; Refill: 0 - Start promethazine (PHENERGAN) 25 MG tablet; Take 1 tablet (25 mg total) by mouth every 8 (eight)  hours as needed for nausea or vomiting.  Dispense: 20 tablet; Refill: 0  3. Other depression, with emotional eating Controlled. Medication: Lexapro 20 mg daily.  Plan:  Continue Lexapro.  Behavior modification techniques were discussed today to help deal with emotional/non-hunger eating behaviors.  4. At risk for nausea Cynthia [name] was given approximately 8 minutes of nausea prevention counseling today. Cynthia [name] is at risk for nausea due to her new or current medication. She was encouraged to titrate her medication slowly, make sure to stay hydrated, eat smaller portions throughout the day, and avoid high fat meals.   5. Class 2 severe obesity with serious comorbidity and body mass index (BMI) of 38.0 to 38.9 in adult, unspecified obesity type (Cynthia [name])  Plan:  Will start [place], as per below.  - Start Liraglutide -Weight Management (SAXENDA) 18 MG/3ML SOPN; Inject 3 mg into the skin daily. 0.6 mg daily x 1 week, then 1.2 mg daily x 1 weeks, then 1.8 mg daily x 1 week, then 2.4 mg daily x 1 week, then 3 mg daily.  Dispense: 15 mL; Refill: 0 - Start Insulin Pen Needle 32G X 4 MM MISC; Use daily for Saxenda administration  Dispense: 100 each; Refill: 0  Course: Cynthia [name] is currently in the action stage of change. As such, her goal is to continue with weight loss efforts.   Nutrition goals: She has agreed to keeping a food journal and adhering to recommended goals of 1300-1600 calories and 80-100 grams of protein.   Exercise goals: For substantial health benefits, adults  should do at least 150 minutes (2 hours and 30 minutes) a week of moderate-intensity, or 75 minutes (1 hour and 15 minutes) a week of vigorous-intensity aerobic physical activity, or an equivalent combination of moderate- and vigorous-intensity aerobic activity. Aerobic activity should be performed in episodes of at least 10 minutes, and preferably, it should be spread throughout the week.  Behavioral modification strategies:  increasing lean protein intake, decreasing simple carbohydrates, increasing vegetables, increasing water intake and decreasing liquid calories.  Cynthia [name] has agreed to follow-up with our clinic in 3 weeks. She was informed of the importance of frequent follow-up visits to maximize her success with intensive lifestyle modifications for her multiple health conditions.   Objective:   Blood pressure 137/82, pulse (!) 54, temperature 98.4 F (36.9 C), temperature source Oral, height 5\' 1"  (1.549 m), weight 206 lb (93.4 kg), SpO2 100 %. Body mass index is 38.92 kg/m.  General: Cooperative, alert, well developed, in no acute distress. HEENT: Conjunctivae and lids unremarkable. Cardiovascular: Regular rhythm.  Lungs: Normal work of breathing. Neurologic: No focal deficits.   Lab Results  Component Value Date   CREATININE 0.65 07/20/2020   BUN 13 07/20/2020   NA 140 07/20/2020   K 4.6 07/20/2020   CL 104 07/20/2020   CO2 23 07/20/2020   Lab Results  Component Value Date   ALT 10 07/20/2020   AST 12 07/20/2020   ALKPHOS 68 07/20/2020   BILITOT <0.2 07/20/2020   Lab Results  Component Value Date   HGBA1C 6.1 (H) 07/20/2020   HGBA1C 4.5 (L) 03/18/2018   HGBA1C 5.5 01/16/2017   HGBA1C 5.9 12/28/2015   Lab Results  Component Value Date   INSULIN 25.6 (H) 07/20/2020   Lab Results  Component Value Date   TSH 2.100 07/20/2020   Lab Results  Component Value Date   CHOL 206 (H) 07/20/2020   HDL 49 07/20/2020   LDLCALC 134 (H) 07/20/2020   LDLDIRECT 110.0 12/28/2015   TRIG 131 07/20/2020   CHOLHDL 5 08/12/2019   Lab Results  Component Value Date   WBC 7.8 07/20/2020   HGB 11.9 07/20/2020   HCT 38.3 07/20/2020   MCV 85 07/20/2020   PLT 343 07/20/2020   Lab Results  Component Value Date   IRON 158 (H) 12/27/2014   TIBC 422 12/27/2014   FERRITIN 45 12/27/2014   Attestation Statements:   Reviewed by clinician on day of visit: allergies, medications, problem list,  medical history, surgical history, family history, social history, and previous encounter notes.  I, Water quality scientist, CMA, am acting as transcriptionist for Briscoe Deutscher, DO  I have reviewed the above documentation for accuracy and completeness, and I agree with the above. Briscoe Deutscher, DO

## 2021-02-02 ENCOUNTER — Telehealth (INDEPENDENT_AMBULATORY_CARE_PROVIDER_SITE_OTHER): Payer: Self-pay

## 2021-02-02 NOTE — Telephone Encounter (Signed)
Saxenda PA approved 01/26/21-05/25/2021

## 2021-02-14 ENCOUNTER — Other Ambulatory Visit (INDEPENDENT_AMBULATORY_CARE_PROVIDER_SITE_OTHER): Payer: Self-pay | Admitting: Family Medicine

## 2021-02-14 DIAGNOSIS — R632 Polyphagia: Secondary | ICD-10-CM

## 2021-02-14 NOTE — Telephone Encounter (Signed)
Last OV with Dr Wallace 

## 2021-02-16 ENCOUNTER — Encounter (INDEPENDENT_AMBULATORY_CARE_PROVIDER_SITE_OTHER): Payer: Self-pay

## 2021-02-16 NOTE — Telephone Encounter (Signed)
Message sent to pt.

## 2021-02-21 ENCOUNTER — Ambulatory Visit (INDEPENDENT_AMBULATORY_CARE_PROVIDER_SITE_OTHER): Payer: 59 | Admitting: Psychology

## 2021-02-21 DIAGNOSIS — F411 Generalized anxiety disorder: Secondary | ICD-10-CM

## 2021-02-22 ENCOUNTER — Ambulatory Visit (INDEPENDENT_AMBULATORY_CARE_PROVIDER_SITE_OTHER): Payer: 59 | Admitting: Family Medicine

## 2021-02-22 ENCOUNTER — Other Ambulatory Visit: Payer: Self-pay

## 2021-02-22 ENCOUNTER — Other Ambulatory Visit (INDEPENDENT_AMBULATORY_CARE_PROVIDER_SITE_OTHER): Payer: Self-pay | Admitting: Family Medicine

## 2021-02-22 ENCOUNTER — Encounter (INDEPENDENT_AMBULATORY_CARE_PROVIDER_SITE_OTHER): Payer: Self-pay | Admitting: Family Medicine

## 2021-02-22 VITALS — BP 145/95 | HR 90 | Temp 97.5°F | Ht 61.0 in | Wt 207.0 lb

## 2021-02-22 DIAGNOSIS — E559 Vitamin D deficiency, unspecified: Secondary | ICD-10-CM

## 2021-02-22 DIAGNOSIS — J3081 Allergic rhinitis due to animal (cat) (dog) hair and dander: Secondary | ICD-10-CM

## 2021-02-22 DIAGNOSIS — R632 Polyphagia: Secondary | ICD-10-CM

## 2021-02-22 MED ORDER — TROKENDI XR 50 MG PO CP24: 1.0000 | ORAL_CAPSULE | Freq: Every day | ORAL | 2 refills | Status: DC

## 2021-02-22 MED ORDER — FLUTICASONE PROPIONATE 50 MCG/ACT NA SUSP: 2.0000 | Freq: Every day | NASAL | 6 refills | Status: DC

## 2021-02-22 MED ORDER — MONTELUKAST SODIUM 10 MG PO TABS: 10.0000 mg | ORAL_TABLET | Freq: Every day | ORAL | 3 refills | Status: DC

## 2021-02-22 MED ORDER — VITAMIN D (ERGOCALCIFEROL) 1.25 MG (50000 UNIT) PO CAPS: 50000.0000 [IU] | ORAL_CAPSULE | ORAL | 0 refills | Status: DC

## 2021-02-22 NOTE — Progress Notes (Signed)
Chief Complaint:   OBESITY Cynthia [name] is here to discuss her progress with her obesity treatment plan along with follow-up of her obesity related diagnoses.   Today's visit was #: 10 Starting weight: 223 lbs Starting date: 07/20/2020 Today's weight: 207 lbs Today's date: 02/22/2021 Total lbs lost to date: 16 lbs Body mass index is 39.11 kg/m.  Total weight loss percentage to date: -7.17%  Interim History:  Cynthia [name] is up 2.5 pounds of muscle, down 1 pound of fat, and up 0.5 pounds of water.  She says she loves the Saxenda - now on 1.8 mg daily.  She denies constipation or nausea.  Current Meal Plan: keeping a food journal and adhering to recommended goals of 1400-1500 calories and 95 grams of protein for 60% of the time.  Current Exercise Plan: Spin for 30 minutes 2 times per week. Current Anti-Obesity Medications: Saxenda 1.8 mg subcutaneously daily. Side effects: None.  Assessment/Plan:   1. Polyphagia Current treatment: Saxenda 1.8 subcutaneously daily and Tro[place]endi 50 mg daily. Polyphagia refers to excessive feelings of hunger. She will continue to focus on protein-rich, low simple carbohydrate foods. We reviewed the importance of hydration, regular exercise for stress reduction, and restorative sleep.  - Refill Topiramate ER (TROKENDI XR) 50 MG CP24; Take 1 capsule by mouth at bedtime.  Dispense: 90 capsule; Refill: 2  2. Allergic rhinitis due to animal hair and dander Will start Singulair 10 mg daily and Flonase 2 sprays daily, as per below.  - Start montelukast (SINGULAIR) 10 MG tablet; Take 1 tablet (10 mg total) by mouth at bedtime.  Dispense: 30 tablet; Refill: 3 - Start fluticasone (FLONASE) 50 MCG/ACT nasal spray; Place 2 sprays into both nostrils daily.  Dispense: 16 g; Refill: 6  3. Vitamin D deficiency Not at goal. Current vitamin D is 16.3, tested on 07/20/2020. Optimal goal > 50 ng/dL.  She is taking vitamin D 50,000 IU weekly.  Plan: Continue to take prescription  Vitamin D @50 ,000 IU every week as prescribed.  Follow-up for routine testing of Vitamin D, at least 2-3 times per year to avoid over-replacement.  - Refill Vitamin D, Ergocalciferol, (DRISDOL) 1.25 MG (50000 UNIT) CAPS capsule; Take 1 capsule (50,000 Units total) by mouth every 7 (seven) days.  Dispense: 4 capsule; Refill: 0  4. Class 2 severe obesity with serious comorbidity and body mass index (BMI) of 39.0 to 39.9 in adult, unspecified obesity type ([place])  Course: Cynthia [name] is currently in the action stage of change. As such, her goal is to continue with weight loss efforts.   Nutrition goals: She has agreed to keeping a food journal and adhering to recommended goals of 1400-1500 calories and 95 grams of protein.   Exercise goals: For substantial health benefits, adults should do at least 150 minutes (2 hours and 30 minutes) a week of moderate-intensity, or 75 minutes (1 hour and 15 minutes) a week of vigorous-intensity aerobic physical activity, or an equivalent combination of moderate- and vigorous-intensity aerobic activity. Aerobic activity should be performed in episodes of at least 10 minutes, and preferably, it should be spread throughout the week.  Behavioral modification strategies: increasing lean protein intake, decreasing simple carbohydrates, increasing vegetables and increasing water intake.  Cynthia [name] has agreed to follow-up with our clinic in 3 weeks. She was informed of the importance of frequent follow-up visits to maximize her success with intensive lifestyle modifications for her multiple health conditions.   Objective:   Blood pressure (!) 145/95, pulse 90, temperature (!)  97.5 F (36.4 C), temperature source Oral, height 5\' 1"  (1.549 m), weight 207 lb (93.9 kg), SpO2 98 %. Body mass index is 39.11 kg/m.  General: Cooperative, alert, well developed, in no acute distress. HEENT: Conjunctivae and lids unremarkable. Cardiovascular: Regular rhythm.  Lungs: Normal work of  breathing. Neurologic: No focal deficits.   Lab Results  Component Value Date   CREATININE 0.65 07/20/2020   BUN 13 07/20/2020   NA 140 07/20/2020   K 4.6 07/20/2020   CL 104 07/20/2020   CO2 23 07/20/2020   Lab Results  Component Value Date   ALT 10 07/20/2020   AST 12 07/20/2020   ALKPHOS 68 07/20/2020   BILITOT <0.2 07/20/2020   Lab Results  Component Value Date   HGBA1C 6.1 (H) 07/20/2020   HGBA1C 4.5 (L) 03/18/2018   HGBA1C 5.5 01/16/2017   HGBA1C 5.9 12/28/2015   Lab Results  Component Value Date   INSULIN 25.6 (H) 07/20/2020   Lab Results  Component Value Date   TSH 2.100 07/20/2020   Lab Results  Component Value Date   CHOL 206 (H) 07/20/2020   HDL 49 07/20/2020   LDLCALC 134 (H) 07/20/2020   LDLDIRECT 110.0 12/28/2015   TRIG 131 07/20/2020   CHOLHDL 5 08/12/2019   Lab Results  Component Value Date   WBC 7.8 07/20/2020   HGB 11.9 07/20/2020   HCT 38.3 07/20/2020   MCV 85 07/20/2020   PLT 343 07/20/2020   Lab Results  Component Value Date   IRON 158 (H) 12/27/2014   TIBC 422 12/27/2014   FERRITIN 45 12/27/2014   Attestation Statements:   Reviewed by clinician on day of visit: allergies, medications, problem list, medical history, surgical history, family history, social history, and previous encounter notes.  I, Water quality scientist, CMA, am acting as transcriptionist for Briscoe Deutscher, DO  I have reviewed the above documentation for accuracy and completeness, and I agree with the above. Briscoe Deutscher, DO

## 2021-02-26 ENCOUNTER — Other Ambulatory Visit (HOSPITAL_COMMUNITY): Payer: Self-pay | Admitting: Physician Assistant

## 2021-02-26 DIAGNOSIS — J069 Acute upper respiratory infection, unspecified: Secondary | ICD-10-CM | POA: Diagnosis not present

## 2021-03-17 ENCOUNTER — Encounter (INDEPENDENT_AMBULATORY_CARE_PROVIDER_SITE_OTHER): Payer: Self-pay | Admitting: Family Medicine

## 2021-03-17 DIAGNOSIS — Z6838 Body mass index (BMI) 38.0-38.9, adult: Secondary | ICD-10-CM

## 2021-03-17 DIAGNOSIS — E559 Vitamin D deficiency, unspecified: Secondary | ICD-10-CM

## 2021-03-20 ENCOUNTER — Other Ambulatory Visit (HOSPITAL_COMMUNITY): Payer: Self-pay

## 2021-03-20 MED ORDER — LIRAGLUTIDE -WEIGHT MANAGEMENT 18 MG/3ML ~~LOC~~ SOPN
3.0000 mg | PEN_INJECTOR | Freq: Every day | SUBCUTANEOUS | 0 refills | Status: DC
  Filled 2021-03-20: qty 15, 30d supply, fill #0

## 2021-03-20 MED ORDER — VITAMIN D (ERGOCALCIFEROL) 1.25 MG (50000 UNIT) PO CAPS
ORAL_CAPSULE | ORAL | 0 refills | Status: DC
  Filled 2021-03-20: qty 4, 28d supply, fill #0

## 2021-03-20 NOTE — Telephone Encounter (Signed)
Pt last seen by Dr. Wallace.  

## 2021-03-23 ENCOUNTER — Ambulatory Visit (INDEPENDENT_AMBULATORY_CARE_PROVIDER_SITE_OTHER): Payer: 59 | Admitting: Psychology

## 2021-03-23 DIAGNOSIS — F411 Generalized anxiety disorder: Secondary | ICD-10-CM

## 2021-04-02 DIAGNOSIS — Z1152 Encounter for screening for COVID-19: Secondary | ICD-10-CM | POA: Diagnosis not present

## 2021-04-02 DIAGNOSIS — J069 Acute upper respiratory infection, unspecified: Secondary | ICD-10-CM | POA: Diagnosis not present

## 2021-04-02 DIAGNOSIS — J329 Chronic sinusitis, unspecified: Secondary | ICD-10-CM | POA: Diagnosis not present

## 2021-04-04 ENCOUNTER — Encounter (INDEPENDENT_AMBULATORY_CARE_PROVIDER_SITE_OTHER): Payer: Self-pay | Admitting: Family Medicine

## 2021-04-10 ENCOUNTER — Other Ambulatory Visit: Payer: Self-pay

## 2021-04-10 ENCOUNTER — Encounter (INDEPENDENT_AMBULATORY_CARE_PROVIDER_SITE_OTHER): Payer: Self-pay | Admitting: Family Medicine

## 2021-04-10 ENCOUNTER — Other Ambulatory Visit (HOSPITAL_COMMUNITY): Payer: Self-pay

## 2021-04-10 ENCOUNTER — Ambulatory Visit (INDEPENDENT_AMBULATORY_CARE_PROVIDER_SITE_OTHER): Payer: 59 | Admitting: Family Medicine

## 2021-04-10 VITALS — BP 139/87 | HR 87 | Temp 98.2°F | Ht 61.0 in | Wt 205.0 lb

## 2021-04-10 DIAGNOSIS — F32A Depression, unspecified: Secondary | ICD-10-CM

## 2021-04-10 DIAGNOSIS — R632 Polyphagia: Secondary | ICD-10-CM | POA: Diagnosis not present

## 2021-04-10 DIAGNOSIS — F419 Anxiety disorder, unspecified: Secondary | ICD-10-CM

## 2021-04-10 DIAGNOSIS — E66813 Obesity, class 3: Secondary | ICD-10-CM

## 2021-04-10 DIAGNOSIS — Z6841 Body Mass Index (BMI) 40.0 and over, adult: Secondary | ICD-10-CM

## 2021-04-10 DIAGNOSIS — E559 Vitamin D deficiency, unspecified: Secondary | ICD-10-CM | POA: Diagnosis not present

## 2021-04-10 MED ORDER — VITAMIN D (ERGOCALCIFEROL) 1.25 MG (50000 UNIT) PO CAPS
ORAL_CAPSULE | ORAL | 2 refills | Status: DC
  Filled 2021-04-10: qty 12, 84d supply, fill #0

## 2021-04-10 MED ORDER — ESCITALOPRAM OXALATE 20 MG PO TABS
ORAL_TABLET | Freq: Every day | ORAL | 1 refills | Status: DC
  Filled 2021-04-10: qty 90, 90d supply, fill #0
  Filled 2021-08-02: qty 90, 90d supply, fill #1

## 2021-04-13 MED FILL — Montelukast Sodium Tab 10 MG (Base Equiv): ORAL | 30 days supply | Qty: 30 | Fill #0 | Status: AC

## 2021-04-14 ENCOUNTER — Other Ambulatory Visit (HOSPITAL_COMMUNITY): Payer: Self-pay

## 2021-04-17 NOTE — Progress Notes (Signed)
Chief Complaint:   OBESITY Cynthia [name] is here to discuss her progress with her obesity treatment plan along with follow-up of her obesity related diagnoses.   Today's visit was #: 11 Starting weight: 223 lbs Starting date: 07/20/2020 Today's weight: 205 lbs Today's date: 04/10/2021 Total lbs lost to date: 18 lbs Body mass index is 38.73 kg/m.  Total weight loss percentage to date: -8.07%  Interim History:  Cynthia [name] is doing well with Peloton.  She has been under more stress lately.  She says she is having a difficult time with calorie counting.  Current Meal Plan: keeping a food journal and adhering to recommended goals of 1400-1500 calories and 95 grams of protein for 40% of the time.  Current Exercise Plan: Spin class for 30 minutes 2 times per week. Current Anti-Obesity Medications: Saxenda 3 mg subcutaneously daily. Side effects: None.  Assessment/Plan:   1. Polyphagia Current treatment: Saxenda 3 mg subcutaneously daily. Polyphagia refers to excessive feelings of hunger. She will continue to focus on protein-rich, low simple carbohydrate foods. We reviewed the importance of hydration, regular exercise for stress reduction, and restorative sleep.  2. Vitamin D deficiency Not at goal. Current vitamin D is 16.3, tested on 07/20/2020. Optimal goal > 50 ng/dL.  She is taking vitamin D 50,000 IU weekly.  Plan: Continue to take prescription Vitamin D @50 ,000 IU every week as prescribed.  Follow-up for routine testing of Vitamin D, at least 2-3 times per year to avoid over-replacement.  - Refill Vitamin D, Ergocalciferol, (DRISDOL) 1.25 MG (50000 UNIT) CAPS capsule; TAKE 1 CAPSULE (50,000 UNITS TOTAL) BY MOUTH EVERY 7 (SEVEN) DAYS.  Dispense: 4 capsule; Refill: 2  3. Anxiety and depression Not at goal. Medication: Lexapro 20 mg daily.  Plan:  Behavior modification techniques were discussed today to help deal with emotional/non-hunger eating behaviors.  - Refill escitalopram (LEXAPRO)  20 MG tablet; TAKE 1 TABLET (20 MG TOTAL) BY MOUTH DAILY.  Dispense: 90 tablet; Refill: 1  4. Obesity, current BMI 38.8  Course: Cynthia [name] is currently in the action stage of change. As such, her goal is to continue with weight loss efforts.   Nutrition goals: She has agreed to keeping a food journal and adhering to recommended goals of 1400-1500 calories and 95 grams of protein.   Exercise goals: For substantial health benefits, adults should do at least 150 minutes (2 hours and 30 minutes) a week of moderate-intensity, or 75 minutes (1 hour and 15 minutes) a week of vigorous-intensity aerobic physical activity, or an equivalent combination of moderate- and vigorous-intensity aerobic activity. Aerobic activity should be performed in episodes of at least 10 minutes, and preferably, it should be spread throughout the week.  Behavioral modification strategies: increasing lean protein intake, decreasing simple carbohydrates, increasing vegetables and increasing water intake.  Cynthia [name] has agreed to follow-up with our clinic in 4 weeks. She was informed of the importance of frequent follow-up visits to maximize her success with intensive lifestyle modifications for her multiple health conditions.   Objective:   Blood pressure 139/87, pulse 87, temperature 98.2 F (36.8 C), temperature source Oral, height 5\' 1"  (1.549 m), weight 205 lb (93 kg), SpO2 99 %. Body mass index is 38.73 kg/m.  General: Cooperative, alert, well developed, in no acute distress. HEENT: Conjunctivae and lids unremarkable. Cardiovascular: Regular rhythm.  Lungs: Normal work of breathing. Neurologic: No focal deficits.   Lab Results  Component Value Date   CREATININE 0.65 07/20/2020   BUN 13 07/20/2020  NA 140 07/20/2020   K 4.6 07/20/2020   CL 104 07/20/2020   CO2 23 07/20/2020   Lab Results  Component Value Date   ALT 10 07/20/2020   AST 12 07/20/2020   ALKPHOS 68 07/20/2020   BILITOT <0.2 07/20/2020   Lab  Results  Component Value Date   HGBA1C 6.1 (H) 07/20/2020   HGBA1C 4.5 (L) 03/18/2018   HGBA1C 5.5 01/16/2017   HGBA1C 5.9 12/28/2015   Lab Results  Component Value Date   INSULIN 25.6 (H) 07/20/2020   Lab Results  Component Value Date   TSH 2.100 07/20/2020   Lab Results  Component Value Date   CHOL 206 (H) 07/20/2020   HDL 49 07/20/2020   LDLCALC 134 (H) 07/20/2020   LDLDIRECT 110.0 12/28/2015   TRIG 131 07/20/2020   CHOLHDL 5 08/12/2019   Lab Results  Component Value Date   WBC 7.8 07/20/2020   HGB 11.9 07/20/2020   HCT 38.3 07/20/2020   MCV 85 07/20/2020   PLT 343 07/20/2020   Lab Results  Component Value Date   IRON 158 (H) 12/27/2014   TIBC 422 12/27/2014   FERRITIN 45 12/27/2014   Attestation Statements:   Reviewed by clinician on day of visit: allergies, medications, problem list, medical history, surgical history, family history, social history, and previous encounter notes.  I, Water quality scientist, CMA, am acting as transcriptionist for Briscoe Deutscher, DO  I have reviewed the above documentation for accuracy and completeness, and I agree with the above. Briscoe Deutscher, DO

## 2021-04-19 ENCOUNTER — Ambulatory Visit (INDEPENDENT_AMBULATORY_CARE_PROVIDER_SITE_OTHER): Payer: 59 | Admitting: Psychology

## 2021-04-19 DIAGNOSIS — F411 Generalized anxiety disorder: Secondary | ICD-10-CM | POA: Diagnosis not present

## 2021-05-02 ENCOUNTER — Encounter: Payer: Self-pay | Admitting: Family Medicine

## 2021-05-02 ENCOUNTER — Other Ambulatory Visit (HOSPITAL_COMMUNITY): Payer: Self-pay

## 2021-05-02 ENCOUNTER — Telehealth (INDEPENDENT_AMBULATORY_CARE_PROVIDER_SITE_OTHER): Payer: 59 | Admitting: Family Medicine

## 2021-05-02 DIAGNOSIS — J329 Chronic sinusitis, unspecified: Secondary | ICD-10-CM

## 2021-05-02 DIAGNOSIS — J069 Acute upper respiratory infection, unspecified: Secondary | ICD-10-CM

## 2021-05-02 DIAGNOSIS — J029 Acute pharyngitis, unspecified: Secondary | ICD-10-CM

## 2021-05-02 DIAGNOSIS — B9689 Other specified bacterial agents as the cause of diseases classified elsewhere: Secondary | ICD-10-CM

## 2021-05-02 MED ORDER — AMOXICILLIN 875 MG PO TABS
875.0000 mg | ORAL_TABLET | Freq: Two times a day (BID) | ORAL | 0 refills | Status: AC
  Filled 2021-05-02: qty 20, 10d supply, fill #0

## 2021-05-02 NOTE — Progress Notes (Signed)
Virtual Visit via Video   I connected with patient on 05/02/21 at  9:30 AM EDT by a video enabled telemedicine application and verified that I am speaking with the correct person using two identifiers.  Location patient: Home Location provider: [facility] Persons participating in the virtual visit: Patient, Provider, Anna (Sabrina M)  I discussed the limitations of evaluation and management by telemedicine and the availability of in person appointments. The patient expressed understanding and agreed to proceed.  Subjective:   HPI:   URI- sxs started Thursday w/ sore throat, cough, and nasal congestion.  Cough is now productive of brown/green sputum.  + sinus pressure, headache, drainage.  Now w/ severe sore throat.  Denies fever.  Kids were sick first.  Evelina Bucy a home COVID test on Friday- negative.    L shoulder- pt was lying w/ daughter yesterday and woke up w/ burning pain in L shoulder.  Improves w/ heat.  Used lidocaine patch w/ some relief.  Better today.  ROS:   See pertinent positives and negatives per HPI.  Patient Active Problem List   Diagnosis Date Noted  . Anxiety and depression 12/31/2020  . Pre-diabetes 11/30/2020  . At risk for deficient intake of food 11/14/2020  . Frequent headaches 09/28/2020  . Vitamin D deficiency 08/12/2019  . PCOS (polycystic ovarian syndrome) 05/06/2018  . GERD (gastroesophageal reflux disease) 02/13/2018  . Eustachian tube anomaly 02/13/2018  . Depression, recurrent ([place]) 01/03/2018  . Gestational diabetes 01/23/2017  . Physical exam 12/28/2015  . Pelvic adhesive disease 06/17/2015    Class: Present on Admission  . Menorrhagia with irregular cycle 12/20/2014  . Anemia 12/20/2014  . Hyperlipidemia 11/22/2014  . Class 2 severe obesity with serious comorbidity and body mass index (BMI) of 38.0 to 38.9 in adult ([place]) 11/22/2014  . Anxiety state 11/22/2014    Social History   Tobacco Use  . Smoking status: Former  Smoker    Packs/day: 1.00    Years: 10.00    Pack years: 10.00    Types: Cigarettes    Quit date: 11/23/2011    Years since quitting: 9.4  . Smokeless tobacco: Never Used  Substance Use Topics  . Alcohol use: Yes    Comment: rare    Current Outpatient Medications:  .  cetirizine (ZYRTEC) 10 MG tablet, Take 10 mg by mouth daily., Disp: , Rfl:  .  diphenhydrAMINE (BENADRYL) 50 MG capsule, Take 50 mg by mouth at bedtime as needed., Disp: , Rfl:  .  escitalopram (LEXAPRO) 20 MG tablet, TAKE 1 TABLET (20 MG TOTAL) BY MOUTH DAILY., Disp: 90 tablet, Rfl: 1 .  fluticasone (FLONASE) 50 MCG/ACT nasal spray, PLACE 2 SPRAYS INTO BOTH NOSTRILS DAILY., Disp: 16 g, Rfl: 6 .  Insulin Pen Needle 32G X 4 MM MISC, USE DAILY FOR SAXENDA ADMINISTRATION, Disp: 100 each, Rfl: 0 .  Liraglutide -Weight Management 18 MG/3ML SOPN, Inject 3 mg into the skin daily., Disp: 15 mL, Rfl: 0 .  montelukast (SINGULAIR) 10 MG tablet, TAKE 1 TABLET (10 MG TOTAL) BY MOUTH AT BEDTIME., Disp: 30 tablet, Rfl: 3 .  norethindrone-ethinyl estradiol (LOESTRIN FE) 1-20 MG-MCG tablet, TAKE 1 TABLET BY MOUTH DAILY., Disp: 84 tablet, Rfl: 4 .  Topiramate ER (TROKENDI XR) 50 MG CP24, TAKE 1 CAPSULE BY MOUTH AT BEDTIME., Disp: 90 capsule, Rfl: 2 .  Vitamin D, Ergocalciferol, (DRISDOL) 1.25 MG (50000 UNIT) CAPS capsule, TAKE 1 CAPSULE (50,000 UNITS TOTAL) BY MOUTH EVERY 7 (SEVEN) DAYS., Disp: 4 capsule,  Rfl: 2  No Known Allergies  Objective:   There were no vitals taken for this visit.  AAOx3, NAD NCAT, EOMI, mildly swollen eyes bilaterally No obvious CN deficits Coloring WNL Audible nasal congestion Pt is able to speak clearly, coherently without shortness of breath or increased work of breathing.  + hacking cough Thought process is linear.  Mood is appropriate.   Assessment and Plan:   Sinusitis- new.  Pt has persistent and discolored drainage from both nose and chest.  + facial pain, HA, recent sick contacts. Start  Amoxicillin, Mucinex.  Reviewed supportive care and red flags that should prompt return.  Pt expressed understanding and is in agreement w/ plan.   URI- new.  Encouraged pt to repeat COVID test to be sure she is not putting others at risk.  Reviewed supportive care.  Pt expressed understanding and is in agreement w/ plan.   Pharyngitis- new.  Pt reports this is the most bothersome sx and it has been worsening.  There is a high prevalence of strep in the community right now so will treat w/ Amoxicillin.  Reviewed supportive care and red flags that should prompt return.  Pt expressed understanding and is in agreement w/ plan.     Annye Asa, MD 05/02/2021

## 2021-05-02 NOTE — Progress Notes (Signed)
I connected with  Cynthia [name] on 05/02/21 by a video enabled telemedicine application and verified that I am speaking with the correct person using two identifiers.   I discussed the limitations of evaluation and management by telemedicine. The patient expressed understanding and agreed to proceed.

## 2021-05-15 ENCOUNTER — Encounter (INDEPENDENT_AMBULATORY_CARE_PROVIDER_SITE_OTHER): Payer: Self-pay | Admitting: Family Medicine

## 2021-05-15 ENCOUNTER — Ambulatory Visit (INDEPENDENT_AMBULATORY_CARE_PROVIDER_SITE_OTHER): Payer: 59 | Admitting: Family Medicine

## 2021-05-16 ENCOUNTER — Encounter (INDEPENDENT_AMBULATORY_CARE_PROVIDER_SITE_OTHER): Payer: Self-pay

## 2021-05-18 ENCOUNTER — Other Ambulatory Visit (INDEPENDENT_AMBULATORY_CARE_PROVIDER_SITE_OTHER): Payer: Self-pay | Admitting: Family Medicine

## 2021-05-18 ENCOUNTER — Other Ambulatory Visit (HOSPITAL_COMMUNITY): Payer: Self-pay

## 2021-05-18 DIAGNOSIS — Z6838 Body mass index (BMI) 38.0-38.9, adult: Secondary | ICD-10-CM

## 2021-05-18 MED ORDER — SAXENDA 18 MG/3ML ~~LOC~~ SOPN
3.0000 mg | PEN_INJECTOR | Freq: Every day | SUBCUTANEOUS | 0 refills | Status: DC
  Filled 2021-05-18: qty 15, 30d supply, fill #0

## 2021-05-18 MED ORDER — BD PEN NEEDLE NANO U/F 32G X 4 MM MISC
0 refills | Status: AC
  Filled 2021-05-18: qty 100, 90d supply, fill #0

## 2021-05-18 MED FILL — Norethindrone Ace & Ethinyl Estradiol-FE Tab 1 MG-20 MCG: ORAL | 84 days supply | Qty: 84 | Fill #0 | Status: AC

## 2021-05-23 ENCOUNTER — Ambulatory Visit: Payer: 59 | Admitting: Clinical

## 2021-06-07 ENCOUNTER — Encounter: Payer: Self-pay | Admitting: *Deleted

## 2021-06-14 ENCOUNTER — Other Ambulatory Visit (HOSPITAL_COMMUNITY): Payer: Self-pay

## 2021-06-14 ENCOUNTER — Encounter (INDEPENDENT_AMBULATORY_CARE_PROVIDER_SITE_OTHER): Payer: Self-pay | Admitting: Family Medicine

## 2021-06-14 ENCOUNTER — Other Ambulatory Visit: Payer: Self-pay

## 2021-06-14 ENCOUNTER — Ambulatory Visit (INDEPENDENT_AMBULATORY_CARE_PROVIDER_SITE_OTHER): Payer: 59 | Admitting: Family Medicine

## 2021-06-14 VITALS — BP 146/97 | HR 90 | Temp 98.1°F | Ht 61.0 in | Wt 208.0 lb

## 2021-06-14 DIAGNOSIS — Z6841 Body Mass Index (BMI) 40.0 and over, adult: Secondary | ICD-10-CM

## 2021-06-14 DIAGNOSIS — G43809 Other migraine, not intractable, without status migrainosus: Secondary | ICD-10-CM

## 2021-06-14 DIAGNOSIS — M62830 Muscle spasm of back: Secondary | ICD-10-CM | POA: Diagnosis not present

## 2021-06-14 DIAGNOSIS — R7301 Impaired fasting glucose: Secondary | ICD-10-CM

## 2021-06-14 DIAGNOSIS — E559 Vitamin D deficiency, unspecified: Secondary | ICD-10-CM

## 2021-06-14 MED ORDER — BACLOFEN 10 MG PO TABS
10.0000 mg | ORAL_TABLET | Freq: Every evening | ORAL | 0 refills | Status: DC | PRN
  Filled 2021-06-14: qty 30, 30d supply, fill #0

## 2021-06-14 MED ORDER — MELOXICAM 15 MG PO TABS
15.0000 mg | ORAL_TABLET | Freq: Every day | ORAL | 0 refills | Status: DC
  Filled 2021-06-14: qty 30, 30d supply, fill #0

## 2021-06-14 MED ORDER — TROKENDI XR 100 MG PO CP24
100.0000 mg | ORAL_CAPSULE | Freq: Every day | ORAL | 0 refills | Status: DC
Start: 2021-06-14 — End: 2021-07-18
  Filled 2021-06-14: qty 30, 30d supply, fill #0

## 2021-06-14 MED ORDER — VITAMIN D (ERGOCALCIFEROL) 1.25 MG (50000 UNIT) PO CAPS
ORAL_CAPSULE | ORAL | 2 refills | Status: DC
  Filled 2021-06-14: qty 12, 84d supply, fill #0

## 2021-06-14 MED ORDER — OZEMPIC (0.25 OR 0.5 MG/DOSE) 2 MG/1.5ML ~~LOC~~ SOPN
0.5000 mg | PEN_INJECTOR | SUBCUTANEOUS | 0 refills | Status: DC
Start: 2021-06-14 — End: 2021-07-18
  Filled 2021-06-14: qty 1.5, 28d supply, fill #0

## 2021-06-15 ENCOUNTER — Other Ambulatory Visit (HOSPITAL_COMMUNITY): Payer: Self-pay

## 2021-06-21 NOTE — Progress Notes (Signed)
Chief Complaint:   OBESITY Cynthia [name] is here to discuss her progress with her obesity treatment plan along with follow-up of her obesity related diagnoses.   Today's visit was #: 12 Starting weight: 223 lbs Starting date: 07/20/2020 Today's weight: 208 lbs Today's date: 06/14/2021 Weight change since last visit: +3 lbs Total lbs lost to date: 15 lbs Body mass index is 39.3 kg/m.  Total weight loss percentage to date: -6.73%  Interim History:  Cynthia [name] has had increased headaches.  She is not sleeping well.  She says her twins are waking at night.  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  Not planning meals.  Current Meal Plan: keeping a food journal and adhering to recommended goals of 1400-1500 calories and 95 grams of protein for 40% of the time.  Current Exercise Plan: Cardio for 45 minutes 1 time per week. Current Anti-Obesity Medications: Saxenda 3 mg subcutaneously daily. Side effects: None.  Assessment/Plan:   Meds ordered this encounter  Medications   Vitamin D, Ergocalciferol, (DRISDOL) 1.25 MG (50000 UNIT) CAPS capsule    Sig: TAKE 1 CAPSULE (50,000 UNITS TOTAL) BY MOUTH EVERY 7 (SEVEN) DAYS.    Dispense:  4 capsule    Refill:  2   Topiramate ER (TROKENDI XR) 100 MG CP24    Sig: Take 100 mg by mouth at bedtime.    Dispense:  30 capsule    Refill:  0   baclofen (LIORESAL) 10 MG tablet    Sig: Take 1 tablet (10 mg total) by mouth at bedtime as needed for muscle spasms.    Dispense:  30 each    Refill:  0   meloxicam (MOBIC) 15 MG tablet    Sig: Take 1 tablet (15 mg total) by mouth daily.    Dispense:  30 tablet    Refill:  0    Hold till pt request RX   Semaglutide,0.25 or 0.5MG /DOS, (OZEMPIC, 0.25 OR 0.5 MG/DOSE,) 2 MG/1.5ML SOPN    Sig: Inject 0.5 mg into the skin once a week.    Dispense:  1.5 mL    Refill:  0    1. Other migraine without status migrainosus, not  intractable Cynthia [name] is taking Trkendi XR 50 mg daily for headache and migraine prevention.  Plan:  Increase Trokendi XR to 100 mg at bedtime.  - Increase and refill topiramate ER (TROKENDI XR) 100 MG CP24; Take 100 mg by mouth at bedtime.  Dispense: 30 capsule; Refill: 0  2. Back spasm Start baclofen 10 mg at bedtime as needed and Mobic 15 mg daily (hold until asks).  - Start baclofen (LIORESAL) 10 MG tablet; Take 1 tablet (10 mg total) by mouth at bedtime as needed for muscle spasms.  Dispense: 30 each; Refill: 0 - Start meloxicam (MOBIC) 15 MG tablet; Take 1 tablet (15 mg total) by mouth daily.  Dispense: 30 tablet; Refill: 0  3. Impaired fasting glucose Stop Saxenda and start Ozempic 0.25 mg subcutaneously weekly, as per below.  - Start semaglutide,0.25 or 0.5MG /DOS, (OZEMPIC, 0.25 OR 0.5 MG/DOSE,) 2 MG/1.5ML SOPN; Inject 0.5 mg into the skin once a week.  Dispense: 1.5 mL; Refill: 0  4. Vitamin D deficiency Not at goal.   Plan: Continue to take prescription Vitamin D @50 ,000 IU every week as prescribed.  Follow-up for routine testing of Vitamin D, at least 2-3 times per year to avoid over-replacement.  Lab Results  Component Value Date   VD25OH 16.3 (L) 07/20/2020   VD25OH 11.09 (L) 08/12/2019   VD25OH 7.66 (L) 02/17/2018   - Refill Vitamin D, Ergocalciferol, (DRISDOL) 1.25 MG (50000 UNIT) CAPS capsule; TAKE 1 CAPSULE (50,000 UNITS TOTAL) BY MOUTH EVERY 7 (SEVEN) DAYS.  Dispense: 4 capsule; Refill: 2  5. Obesity, current BMI 39.4  Course: Cynthia [name] is currently in the action stage of change. As such, her goal is to continue with weight loss efforts.   Nutrition goals: She has agreed to keeping a food journal and adhering to recommended goals of 1400-1500 calories and 95 grams of protein.   Exercise goals:  As is.  Behavioral modification strategies: increasing lean protein intake, decreasing simple carbohydrates, increasing vegetables, increasing water intake, meal planning and  cooking strategies, better snacking choices, and planning for success.  Cynthia [name] has agreed to follow-up with our clinic in 4 weeks. She was informed of the importance of frequent follow-up visits to maximize her success with intensive lifestyle modifications for her multiple health conditions.   Objective:   Blood pressure (!) 146/97, pulse 90, temperature 98.1 F (36.7 C), temperature source Oral, height 5\' 1"  (1.549 m), weight 208 lb (94.3 kg), SpO2 98 %. Body mass index is 39.3 kg/m.  General: Cooperative, alert, well developed, in no acute distress. HEENT: Conjunctivae and lids unremarkable. Cardiovascular: Regular rhythm.  Lungs: Normal work of breathing. Neurologic: No focal deficits.   Lab Results  Component Value Date   CREATININE 0.65 07/20/2020   BUN 13 07/20/2020   NA 140 07/20/2020   K 4.6 07/20/2020   CL 104 07/20/2020   CO2 23 07/20/2020   Lab Results  Component Value Date   ALT 10 07/20/2020   AST 12 07/20/2020   ALKPHOS 68 07/20/2020   BILITOT <0.2 07/20/2020   Lab Results  Component Value Date   HGBA1C 6.1 (H) 07/20/2020   HGBA1C 4.5 (L) 03/18/2018   HGBA1C 5.5 01/16/2017   HGBA1C 5.9 12/28/2015   Lab Results  Component Value Date   INSULIN 25.6 (H) 07/20/2020   Lab Results  Component Value Date   TSH 2.100 07/20/2020   Lab Results  Component Value Date   CHOL 206 (H) 07/20/2020   HDL 49 07/20/2020   LDLCALC 134 (H) 07/20/2020   LDLDIRECT 110.0 12/28/2015   TRIG 131 07/20/2020   CHOLHDL 5 08/12/2019   Lab Results  Component Value Date   VD25OH 16.3 (L) 07/20/2020   VD25OH 11.09 (L) 08/12/2019   VD25OH 7.66 (L) 02/17/2018   Lab Results  Component Value Date   WBC 7.8 07/20/2020   HGB 11.9 07/20/2020   HCT 38.3 07/20/2020   MCV 85 07/20/2020   PLT 343 07/20/2020   Lab Results  Component Value Date   IRON 158 (H) 12/27/2014   TIBC 422 12/27/2014   FERRITIN 45 12/27/2014   Attestation Statements:   Reviewed by clinician on day  of visit: allergies, medications, problem list, medical history, surgical history, family history, social history, and previous encounter notes.  I, Water quality scientist, CMA, am acting as transcriptionist for Briscoe Deutscher, DO  I have reviewed the above documentation for accuracy and completeness, and I agree with the above. Briscoe Deutscher, DO

## 2021-06-26 ENCOUNTER — Ambulatory Visit: Payer: 59 | Admitting: Clinical

## 2021-07-17 ENCOUNTER — Ambulatory Visit (INDEPENDENT_AMBULATORY_CARE_PROVIDER_SITE_OTHER): Payer: 59 | Admitting: Family Medicine

## 2021-07-17 ENCOUNTER — Encounter (INDEPENDENT_AMBULATORY_CARE_PROVIDER_SITE_OTHER): Payer: Self-pay | Admitting: Family Medicine

## 2021-07-17 ENCOUNTER — Other Ambulatory Visit: Payer: Self-pay

## 2021-07-17 VITALS — BP 152/96 | HR 90 | Temp 98.1°F | Ht 61.0 in | Wt 207.0 lb

## 2021-07-17 DIAGNOSIS — J329 Chronic sinusitis, unspecified: Secondary | ICD-10-CM | POA: Diagnosis not present

## 2021-07-17 DIAGNOSIS — Z6841 Body Mass Index (BMI) 40.0 and over, adult: Secondary | ICD-10-CM

## 2021-07-17 DIAGNOSIS — B9689 Other specified bacterial agents as the cause of diseases classified elsewhere: Secondary | ICD-10-CM

## 2021-07-17 DIAGNOSIS — R7301 Impaired fasting glucose: Secondary | ICD-10-CM | POA: Diagnosis not present

## 2021-07-17 DIAGNOSIS — M62830 Muscle spasm of back: Secondary | ICD-10-CM

## 2021-07-17 DIAGNOSIS — G43809 Other migraine, not intractable, without status migrainosus: Secondary | ICD-10-CM

## 2021-07-17 DIAGNOSIS — E66813 Obesity, class 3: Secondary | ICD-10-CM

## 2021-07-17 NOTE — Progress Notes (Signed)
Chief Complaint:   OBESITY Cynthia [name] is here to discuss her progress with her obesity treatment plan along with follow-up of her obesity related diagnoses. See Medical Weight Management Flowsheet for complete bioelectrical impedance results.  Today's visit was #: 87 Starting weight: 223 lbs Starting date: 07/20/2020 Weight change since last visit: 1 lb Total lbs lost to date: 16 lbs Total weight loss percentage to date: -7.17%  Nutrition Plan:  Keeping a food journal and adhering to recommended goals of 1400-1500 calories and 95 grams of protein daily. Activity: Cardio for 45 minutes 1 time per week. Anti-obesity medications: Ozempic 0.5 mg subcutaneously weekly. Reported side effects: None.  Interim History: Cynthia [name]'s back spasms are improving with baclofen and stretching, she says.  She says she had a URI over 1 week ago, with sinus pressure, congestion, and fatigue.  She has been using OTC treatments for it.  Assessment/Plan:   1. Bacterial sinusitis Cynthia [name] had URI about 1 week ago along with sinus pressure.  She has been taking OTC medications, but is still having symptoms.  Plan:  Start amoxicillin 875 mg twice daily for 10 days.  - Start amoxicillin (AMOXIL) 875 MG tablet; Take 1 tablet (875 mg total) by mouth 2 (two) times daily for 10 days.  Dispense: 20 tablet; Refill: 0  2. Impaired fasting glucose Cynthia [name] will continue Ozempic 1 mg subcutaneously weekly.  Will send refill today.  - Refill Semaglutide, 1 MG/DOSE, 4 MG/3ML SOPN; Inject 1 mg into the skin once a week.  Dispense: 3 mL; Refill: 1  3. Back spasm Continue baclofen 10 mg at bedtime as needed for muscle spasm.  Will refill today, as per below.  - Refill baclofen (LIORESAL) 10 MG tablet; Take 1 tablet (10 mg total) by mouth at bedtime as needed for muscle spasms.  Dispense: 30 each; Refill: 0  4. Other migraine without status migrainosus, not intractable Continue Trokendi XR 100 mg at bedtime.  Will send  refill to pharmacy today, as per below.  - Refill Topiramate ER (TROKENDI XR) 100 MG CP24; Take 100 mg by mouth at bedtime.  Dispense: 90 capsule; Refill: 3  5. Obesity, current BMI 39.1  Course: Cynthia [name] is currently in the action stage of change. As such, her goal is to continue with weight loss efforts.   Nutrition goals: She has agreed to keeping a food journal and adhering to recommended goals of 1400-1500 calories and 95 grams of protein.   Exercise goals:  As is.  Behavioral modification strategies: increasing lean protein intake, decreasing simple carbohydrates, increasing vegetables, and increasing water intake.  Cynthia [name] has agreed to follow-up with our clinic in 3 weeks. She was informed of the importance of frequent follow-up visits to maximize her success with intensive lifestyle modifications for her multiple health conditions.   Objective:   Blood pressure (!) 152/96, pulse 90, temperature 98.1 F (36.7 C), temperature source Oral, SpO2 98 %. There is no height or weight on file to calculate BMI.  General: Cooperative, alert, well developed, in no acute distress. HEENT: Conjunctivae and lids unremarkable. Cardiovascular: Regular rhythm.  Lungs: Normal work of breathing. Neurologic: No focal deficits.   Lab Results  Component Value Date   CREATININE 0.65 07/20/2020   BUN 13 07/20/2020   NA 140 07/20/2020   K 4.6 07/20/2020   CL 104 07/20/2020   CO2 23 07/20/2020   Lab Results  Component Value Date   ALT 10 07/20/2020   AST 12 07/20/2020   ALKPHOS  68 07/20/2020   BILITOT <0.2 07/20/2020   Lab Results  Component Value Date   HGBA1C 6.1 (H) 07/20/2020   HGBA1C 4.5 (L) 03/18/2018   HGBA1C 5.5 01/16/2017   HGBA1C 5.9 12/28/2015   Lab Results  Component Value Date   INSULIN 25.6 (H) 07/20/2020   Lab Results  Component Value Date   TSH 2.100 07/20/2020   Lab Results  Component Value Date   CHOL 206 (H) 07/20/2020   HDL 49 07/20/2020   LDLCALC 134 (H)  07/20/2020   LDLDIRECT 110.0 12/28/2015   TRIG 131 07/20/2020   CHOLHDL 5 08/12/2019   Lab Results  Component Value Date   VD25OH 16.3 (L) 07/20/2020   VD25OH 11.09 (L) 08/12/2019   VD25OH 7.66 (L) 02/17/2018   Lab Results  Component Value Date   WBC 7.8 07/20/2020   HGB 11.9 07/20/2020   HCT 38.3 07/20/2020   MCV 85 07/20/2020   PLT 343 07/20/2020   Lab Results  Component Value Date   IRON 158 (H) 12/27/2014   TIBC 422 12/27/2014   FERRITIN 45 12/27/2014   Attestation Statements:   Reviewed by clinician on day of visit: allergies, medications, problem list, medical history, surgical history, family history, social history, and previous encounter notes.  I, Water quality scientist, CMA, am acting as transcriptionist for Briscoe Deutscher, DO  I have reviewed the above documentation for accuracy and completeness, and I agree with the above. Briscoe Deutscher, DO

## 2021-07-18 ENCOUNTER — Other Ambulatory Visit (HOSPITAL_COMMUNITY): Payer: Self-pay

## 2021-07-18 MED ORDER — SEMAGLUTIDE (1 MG/DOSE) 4 MG/3ML ~~LOC~~ SOPN
1.0000 mg | PEN_INJECTOR | SUBCUTANEOUS | 1 refills | Status: DC
  Filled 2021-07-18: qty 3, 28d supply, fill #0
  Filled 2021-08-18 – 2021-09-12 (×2): qty 3, 28d supply, fill #1

## 2021-07-18 MED ORDER — BACLOFEN 10 MG PO TABS
10.0000 mg | ORAL_TABLET | Freq: Every evening | ORAL | 0 refills | Status: DC | PRN
Start: 2021-07-18 — End: 2023-09-25
  Filled 2021-07-18: qty 30, 30d supply, fill #0

## 2021-07-18 MED ORDER — AMOXICILLIN 875 MG PO TABS
875.0000 mg | ORAL_TABLET | Freq: Two times a day (BID) | ORAL | 0 refills | Status: AC
  Filled 2021-07-18: qty 20, 10d supply, fill #0

## 2021-07-18 MED ORDER — TROKENDI XR 100 MG PO CP24
100.0000 mg | ORAL_CAPSULE | Freq: Every day | ORAL | 3 refills | Status: DC
  Filled 2021-07-18: qty 90, 90d supply, fill #0

## 2021-07-22 DIAGNOSIS — R030 Elevated blood-pressure reading, without diagnosis of hypertension: Secondary | ICD-10-CM | POA: Diagnosis not present

## 2021-07-22 DIAGNOSIS — J209 Acute bronchitis, unspecified: Secondary | ICD-10-CM | POA: Diagnosis not present

## 2021-07-22 DIAGNOSIS — R519 Headache, unspecified: Secondary | ICD-10-CM | POA: Diagnosis not present

## 2021-07-22 DIAGNOSIS — J019 Acute sinusitis, unspecified: Secondary | ICD-10-CM | POA: Diagnosis not present

## 2021-08-02 ENCOUNTER — Other Ambulatory Visit (HOSPITAL_COMMUNITY): Payer: Self-pay

## 2021-08-16 ENCOUNTER — Other Ambulatory Visit (HOSPITAL_COMMUNITY): Payer: Self-pay

## 2021-08-16 ENCOUNTER — Ambulatory Visit (INDEPENDENT_AMBULATORY_CARE_PROVIDER_SITE_OTHER): Payer: 59 | Admitting: Family Medicine

## 2021-08-16 ENCOUNTER — Ambulatory Visit (INDEPENDENT_AMBULATORY_CARE_PROVIDER_SITE_OTHER): Payer: 59 | Admitting: Clinical

## 2021-08-16 ENCOUNTER — Other Ambulatory Visit: Payer: Self-pay

## 2021-08-16 ENCOUNTER — Encounter (INDEPENDENT_AMBULATORY_CARE_PROVIDER_SITE_OTHER): Payer: Self-pay | Admitting: Family Medicine

## 2021-08-16 VITALS — BP 131/92 | HR 94 | Temp 97.8°F | Ht 61.0 in | Wt 203.0 lb

## 2021-08-16 DIAGNOSIS — Z6841 Body Mass Index (BMI) 40.0 and over, adult: Secondary | ICD-10-CM

## 2021-08-16 DIAGNOSIS — R7301 Impaired fasting glucose: Secondary | ICD-10-CM | POA: Diagnosis not present

## 2021-08-16 DIAGNOSIS — F4323 Adjustment disorder with mixed anxiety and depressed mood: Secondary | ICD-10-CM

## 2021-08-16 MED FILL — Norethindrone Ace & Ethinyl Estradiol-FE Tab 1 MG-20 MCG: ORAL | 84 days supply | Qty: 84 | Fill #1 | Status: AC

## 2021-08-17 ENCOUNTER — Other Ambulatory Visit (HOSPITAL_COMMUNITY): Payer: Self-pay

## 2021-08-17 MED ORDER — TIRZEPATIDE 5 MG/0.5ML ~~LOC~~ SOAJ: 5.0000 mg | SUBCUTANEOUS | 0 refills | Status: DC

## 2021-08-18 ENCOUNTER — Other Ambulatory Visit (HOSPITAL_COMMUNITY): Payer: Self-pay

## 2021-08-21 NOTE — Progress Notes (Signed)
Chief Complaint:   OBESITY Cynthia [name] is here to discuss her progress with her obesity treatment plan along with follow-up of her obesity related diagnoses.   Today's visit was #: 14 Starting weight: 223 lbs Starting date: 07/20/2020 Today's weight: 203 lbs Today's date: 08/16/2021 Weight change since last visit: 4 lbs Total lbs lost to date: 20 lbs Body mass index is 38.36 kg/m.  Total weight loss percentage to date: -8.97%  Current Meal Plan: keeping a food journal and adhering to recommended goals of 1400-1500 calories and 95 grams of protein for 65-70% of the time.  Current Exercise Plan: Cardio for 30-45 minutes 1-2 times per week. Current Anti-Obesity Medications: Ozempic 1 mg subcutaneously weekly. Side effects: None.  Interim History:  Cynthia [name] is doing well.  She is tolerating her medications well.  Assessment/Plan:   1. Impaired fasting glucose, with polyphagia Not at goal. Goal is HgbA1c < 5.7, fasting insulin closer to 5.  Medication: Ozempic 1 mg subcutaneously weekly.    Plan:  Stop Ozempic and start Mounjaro 5 mg subcutaneously weekly.  She will continue to focus on protein-rich, low simple carbohydrate foods. We reviewed the importance of hydration, regular exercise for stress reduction, and restorative sleep.   Lab Results  Component Value Date   HGBA1C 6.1 (H) 07/20/2020   Lab Results  Component Value Date   INSULIN 25.6 (H) 07/20/2020   - Start tirzepatide [facility]) 5 MG/0.5ML Pen; Inject 5 mg into the skin once a week.  Dispense: 2 mL; Refill: 0  2. Obesity, current BMI 38.4  Course: Cynthia [name] is currently in the action stage of change. As such, her goal is to continue with weight loss efforts.   Nutrition goals: She has agreed to keeping a food journal and adhering to recommended goals of 1400-1500 calories and 95 grams of protein.   Exercise goals:  As is.  Behavioral modification strategies: increasing lean protein intake, decreasing simple  carbohydrates, and increasing vegetables.  Cynthia [name] has agreed to follow-up with our clinic in 4 weeks. She was informed of the importance of frequent follow-up visits to maximize her success with intensive lifestyle modifications for her multiple health conditions.   Objective:   Blood pressure (!) 131/92, pulse 94, temperature 97.8 F (36.6 C), temperature source Oral, height '5\' 1"'$  (1.549 m), weight 203 lb (92.1 kg), SpO2 100 %. Body mass index is 38.36 kg/m.  General: Cooperative, alert, well developed, in no acute distress. HEENT: Conjunctivae and lids unremarkable. Cardiovascular: Regular rhythm.  Lungs: Normal work of breathing. Neurologic: No focal deficits.   Lab Results  Component Value Date   CREATININE 0.65 07/20/2020   BUN 13 07/20/2020   NA 140 07/20/2020   K 4.6 07/20/2020   CL 104 07/20/2020   CO2 23 07/20/2020   Lab Results  Component Value Date   ALT 10 07/20/2020   AST 12 07/20/2020   ALKPHOS 68 07/20/2020   BILITOT <0.2 07/20/2020   Lab Results  Component Value Date   HGBA1C 6.1 (H) 07/20/2020   HGBA1C 4.5 (L) 03/18/2018   HGBA1C 5.5 01/16/2017   HGBA1C 5.9 12/28/2015   Lab Results  Component Value Date   INSULIN 25.6 (H) 07/20/2020   Lab Results  Component Value Date   TSH 2.100 07/20/2020   Lab Results  Component Value Date   CHOL 206 (H) 07/20/2020   HDL 49 07/20/2020   LDLCALC 134 (H) 07/20/2020   LDLDIRECT 110.0 12/28/2015   TRIG 131 07/20/2020   CHOLHDL  5 08/12/2019   Lab Results  Component Value Date   VD25OH 16.3 (L) 07/20/2020   VD25OH 11.09 (L) 08/12/2019   VD25OH 7.66 (L) 02/17/2018   Lab Results  Component Value Date   WBC 7.8 07/20/2020   HGB 11.9 07/20/2020   HCT 38.3 07/20/2020   MCV 85 07/20/2020   PLT 343 07/20/2020   Lab Results  Component Value Date   IRON 158 (H) 12/27/2014   TIBC 422 12/27/2014   FERRITIN 45 12/27/2014   Attestation Statements:   Reviewed by clinician on day of visit: allergies,  medications, problem list, medical history, surgical history, family history, social history, and previous encounter notes.  I, Water quality scientist, CMA, am acting as transcriptionist for Briscoe Deutscher, DO  I have reviewed the above documentation for accuracy and completeness, and I agree with the above. Briscoe Deutscher, DO

## 2021-09-12 ENCOUNTER — Other Ambulatory Visit (HOSPITAL_COMMUNITY): Payer: Self-pay

## 2021-09-13 ENCOUNTER — Ambulatory Visit (INDEPENDENT_AMBULATORY_CARE_PROVIDER_SITE_OTHER): Payer: 59 | Admitting: Clinical

## 2021-09-13 DIAGNOSIS — F4323 Adjustment disorder with mixed anxiety and depressed mood: Secondary | ICD-10-CM

## 2021-09-26 ENCOUNTER — Other Ambulatory Visit (HOSPITAL_COMMUNITY): Payer: Self-pay

## 2021-09-26 ENCOUNTER — Telehealth (INDEPENDENT_AMBULATORY_CARE_PROVIDER_SITE_OTHER): Payer: 59 | Admitting: Family Medicine

## 2021-09-26 ENCOUNTER — Encounter (INDEPENDENT_AMBULATORY_CARE_PROVIDER_SITE_OTHER): Payer: Self-pay | Admitting: Family Medicine

## 2021-09-26 DIAGNOSIS — R7301 Impaired fasting glucose: Secondary | ICD-10-CM

## 2021-09-26 DIAGNOSIS — E559 Vitamin D deficiency, unspecified: Secondary | ICD-10-CM | POA: Diagnosis not present

## 2021-09-26 DIAGNOSIS — Z6841 Body Mass Index (BMI) 40.0 and over, adult: Secondary | ICD-10-CM

## 2021-09-26 MED ORDER — SEMAGLUTIDE (2 MG/DOSE) 8 MG/3ML ~~LOC~~ SOPN
2.0000 mg | PEN_INJECTOR | SUBCUTANEOUS | 0 refills | Status: DC
  Filled 2021-09-26: qty 3, 28d supply, fill #0
  Filled 2021-09-26: qty 9, 84d supply, fill #0

## 2021-09-26 MED ORDER — VITAMIN D (ERGOCALCIFEROL) 1.25 MG (50000 UNIT) PO CAPS
50000.0000 [IU] | ORAL_CAPSULE | ORAL | 2 refills | Status: DC
  Filled 2021-09-26: qty 12, 84d supply, fill #0

## 2021-09-28 NOTE — Progress Notes (Signed)
TeleHealth Visit:  Due to the COVID-19 pandemic, this visit was completed with telemedicine (audio/video) technology to reduce patient and provider exposure as well as to preserve personal protective equipment.   Cynthia [name] has verbally consented to this TeleHealth visit. The patient is located at home, the provider is located at the [facility] office. The participants in this visit include the listed provider and patient. The visit was conducted today via MyChart video.  Chief Complaint: OBESITY Cynthia [name] is here to discuss her progress with her obesity treatment plan along with follow-up of her obesity related diagnoses. Cynthia [name] is on keeping a food journal and adhering to recommended goals of 1400-1500 calories and 95 grams of protein and states she is following her eating plan approximately 65% of the time. Cynthia [name] states she is not exercising regularly at this time.  Today's visit was #: 15 Starting weight: 223 lbs Starting date: 07/20/2020  Interim History: Cynthia [name] has an URI, possible RSV.  Assessment/Plan:   1. Impaired fasting glucose Increase Ozempic to 2 mg subcutaneously weekly, as per below.  - Increase Semaglutide, 2 MG/DOSE, 8 MG/3ML SOPN; Inject 2 mg as directed once a week.  Dispense: 9 mL; Refill: 0  2. Vitamin D deficiency Not at goal.  She is taking vitamin D 50,000 IU weekly.  Plan: Continue to take prescription Vitamin D @50 ,000 IU every week as prescribed.  Follow-up for routine testing of Vitamin D, at least 2-3 times per year to avoid over-replacement.  Lab Results  Component Value Date   VD25OH 16.3 (L) 07/20/2020   VD25OH 11.09 (L) 08/12/2019   VD25OH 7.66 (L) 02/17/2018   - Refill Vitamin D, Ergocalciferol, (DRISDOL) 1.25 MG (50000 UNIT) CAPS capsule; Take 1 capsule (50,000 Units total) by mouth every 7 (seven) days.  Dispense: 4 capsule; Refill: 2  3. Obesity, current BMI 38.4  Cynthia [name] is currently in the action stage of change. As such, her  goal is to continue with weight loss efforts. She has agreed to keeping a food journal and adhering to recommended goals of 1400-1500 calories and 95 grams of protein.   Exercise goals:  Increase NEAT.  Behavioral modification strategies: increasing lean protein intake, decreasing simple carbohydrates, increasing vegetables, increasing water intake, and decreasing liquid calories.  Cynthia [name] has agreed to follow-up with our clinic in 4 weeks. She was informed of the importance of frequent follow-up visits to maximize her success with intensive lifestyle modifications for her multiple health conditions.  Objective:   VITALS: Per patient if applicable, see vitals. GENERAL: Alert and in no acute distress. CARDIOPULMONARY: No increased WOB. Speaking in clear sentences.  PSYCH: Pleasant and cooperative. Speech normal rate and rhythm. Affect is appropriate. Insight and judgement are appropriate. Attention is focused, linear, and appropriate.  NEURO: Oriented as arrived to appointment on time with no prompting.   Lab Results  Component Value Date   CREATININE 0.65 07/20/2020   BUN 13 07/20/2020   NA 140 07/20/2020   K 4.6 07/20/2020   CL 104 07/20/2020   CO2 23 07/20/2020   Lab Results  Component Value Date   ALT 10 07/20/2020   AST 12 07/20/2020   ALKPHOS 68 07/20/2020   BILITOT <0.2 07/20/2020   Lab Results  Component Value Date   HGBA1C 6.1 (H) 07/20/2020   HGBA1C 4.5 (L) 03/18/2018   HGBA1C 5.5 01/16/2017   HGBA1C 5.9 12/28/2015   Lab Results  Component Value Date   INSULIN 25.6 (H) 07/20/2020   Lab Results  Component Value Date   TSH 2.100 07/20/2020   Lab Results  Component Value Date   CHOL 206 (H) 07/20/2020   HDL 49 07/20/2020   LDLCALC 134 (H) 07/20/2020   LDLDIRECT 110.0 12/28/2015   TRIG 131 07/20/2020   CHOLHDL 5 08/12/2019   Lab Results  Component Value Date   VD25OH 16.3 (L) 07/20/2020   VD25OH 11.09 (L) 08/12/2019   VD25OH 7.66 (L) 02/17/2018   Lab  Results  Component Value Date   WBC 7.8 07/20/2020   HGB 11.9 07/20/2020   HCT 38.3 07/20/2020   MCV 85 07/20/2020   PLT 343 07/20/2020   Lab Results  Component Value Date   IRON 158 (H) 12/27/2014   TIBC 422 12/27/2014   FERRITIN 45 12/27/2014   Attestation Statements:   Reviewed by clinician on day of visit: allergies, medications, problem list, medical history, surgical history, family history, social history, and previous encounter notes.  I, Water quality scientist, CMA, am acting as transcriptionist for Briscoe Deutscher, DO  I have reviewed the above documentation for accuracy and completeness, and I agree with the above. - Briscoe Deutscher, DO, MS, FAAFP, DABOM - Family and Bariatric Medicine.

## 2021-10-09 ENCOUNTER — Other Ambulatory Visit: Payer: Self-pay

## 2021-10-09 ENCOUNTER — Encounter: Payer: Self-pay | Admitting: Family Medicine

## 2021-10-09 ENCOUNTER — Encounter: Payer: 59 | Admitting: Family Medicine

## 2021-10-09 ENCOUNTER — Ambulatory Visit (INDEPENDENT_AMBULATORY_CARE_PROVIDER_SITE_OTHER): Payer: 59 | Admitting: Family Medicine

## 2021-10-09 VITALS — BP 118/76 | HR 104 | Temp 97.0°F | Resp 16 | Ht 61.0 in | Wt 204.0 lb

## 2021-10-09 DIAGNOSIS — E559 Vitamin D deficiency, unspecified: Secondary | ICD-10-CM | POA: Diagnosis not present

## 2021-10-09 DIAGNOSIS — Z23 Encounter for immunization: Secondary | ICD-10-CM | POA: Diagnosis not present

## 2021-10-09 DIAGNOSIS — R7303 Prediabetes: Secondary | ICD-10-CM | POA: Diagnosis not present

## 2021-10-09 DIAGNOSIS — F339 Major depressive disorder, recurrent, unspecified: Secondary | ICD-10-CM

## 2021-10-09 DIAGNOSIS — Z0000 Encounter for general adult medical examination without abnormal findings: Secondary | ICD-10-CM | POA: Diagnosis not present

## 2021-10-09 NOTE — Assessment & Plan Note (Signed)
Check labs at upcoming MWM visit and replete prn.

## 2021-10-09 NOTE — Progress Notes (Signed)
   Subjective:    Patient ID: Cynthia [name], female    DOB: September 01, 1981, 40 y.o.   MRN: [id number]  HPI CPE- UTD on Tdap, mammo.  Will get flu today.  Due for pap- but her twins are present today.  No concerns today.  Patient Care Team    Relationship Specialty Notifications Start End  Midge Minium, MD PCP - General Family Medicine  06/13/17    Comment: Charlett Blake, MD  Family Medicine  06/21/16    Comment: Gemma Payor, MD Consulting Physician Obstetrics and Gynecology  09/28/20     Health Maintenance  Topic Date Due   COVID-19 Vaccine (4 - Booster for Pfizer series) 01/14/2021   INFLUENZA VACCINE  07/10/2021   PAP SMEAR-Modifier  10/09/2021 (Originally 01/17/2020)   Hepatitis C Screening  05/02/2022 (Originally 09/11/1998)   TETANUS/TDAP  05/22/2027   HIV Screening  Completed   Pneumococcal Vaccine 74-92 Years old  Aged Out   HPV VACCINES  Aged Out      Review of Systems Patient reports no vision/ hearing changes, adenopathy,fever, weight change,  persistant/recurrent hoarseness , swallowing issues, chest pain, palpitations, edema, persistant/recurrent cough, hemoptysis, dyspnea (rest/exertional/paroxysmal nocturnal), gastrointestinal bleeding (melena, rectal bleeding), abdominal pain, significant heartburn, bowel changes, GU symptoms (dysuria, hematuria, incontinence), Gyn symptoms (abnormal  bleeding, pain),  syncope, focal weakness, memory loss, numbness & tingling, skin/hair/nail changes, abnormal bruising or bleeding.   This visit occurred during the SARS-CoV-2 public health emergency.  Safety protocols were in place, including screening questions prior to the visit, additional usage of staff PPE, and extensive cleaning of exam room while observing appropriate contact time as indicated for disinfecting solutions.      Objective:   Physical Exam General Appearance:    Alert, cooperative, no distress, appears stated age, obese  Head:     Normocephalic, without obvious abnormality, atraumatic  Eyes:    PERRL, conjunctiva/corneas clear, EOM's intact, fundi    benign, both eyes  Ears:    Normal TM's and external ear canals, both ears  Nose:   Deferred due to COVID  Throat:   Neck:   Supple, symmetrical, trachea midline, no adenopathy;    Thyroid: no enlargement/tenderness/nodules  Back:     Symmetric, no curvature, ROM normal, no CVA tenderness  Lungs:     Clear to auscultation bilaterally, respirations unlabored  Chest Wall:    No tenderness or deformity   Heart:    Regular rate and rhythm, S1 and S2 normal, no murmur, rub   or gallop  Breast Exam:    Deferred to GYN  Abdomen:     Soft, non-tender, bowel sounds active all four quadrants,    no masses, no organomegaly  Genitalia:    Deferred to GYN  Rectal:    Extremities:   Extremities normal, atraumatic, no cyanosis or edema  Pulses:   2+ and symmetric all extremities  Skin:   Skin color, texture, turgor normal, no rashes or lesions  Lymph nodes:   Cervical, supraclavicular, and axillary nodes normal  Neurologic:   CNII-XII intact, normal strength, sensation and reflexes    throughout          Assessment & Plan:

## 2021-10-09 NOTE — Assessment & Plan Note (Signed)
Check labs and address any issues

## 2021-10-09 NOTE — Assessment & Plan Note (Signed)
Ongoing issue for pt.  Currently stable on Lexapro.  No med changes at this time.

## 2021-10-09 NOTE — Patient Instructions (Signed)
Follow up in 1 year or as needed Have Dr Juleen China send me a copy of your labs Continue to work on healthy diet and regular exercise- you can do it! Call with any questions or concerns Stay Safe!  Stay Healthy! Happy Halloween!!

## 2021-10-09 NOTE — Assessment & Plan Note (Signed)
Pt's PE WNL w/ exception of obesity.  Due for pap- pt to schedule.  UTD on mammogram.  UTD on Tdap.  Flu given today.  Will get labs at upcoming MWM visit.  Anticipatory guidance provided.

## 2021-10-12 ENCOUNTER — Other Ambulatory Visit: Payer: Self-pay | Admitting: Family Medicine

## 2021-10-12 DIAGNOSIS — Z1231 Encounter for screening mammogram for malignant neoplasm of breast: Secondary | ICD-10-CM

## 2021-10-16 ENCOUNTER — Other Ambulatory Visit (HOSPITAL_COMMUNITY): Payer: Self-pay

## 2021-10-16 ENCOUNTER — Telehealth: Payer: Self-pay

## 2021-10-16 ENCOUNTER — Other Ambulatory Visit: Payer: Self-pay

## 2021-10-16 DIAGNOSIS — J111 Influenza due to unidentified influenza virus with other respiratory manifestations: Secondary | ICD-10-CM

## 2021-10-16 MED ORDER — OSELTAMIVIR PHOSPHATE 75 MG PO CAPS
75.0000 mg | ORAL_CAPSULE | Freq: Every day | ORAL | 0 refills | Status: DC
  Filled 2021-10-16: qty 10, 10d supply, fill #0

## 2021-10-16 NOTE — Telephone Encounter (Signed)
Caller name:Paytan Corbin Ade   On DPR? :Yes  Call back number:[phone]  Provider they see: Birdie Riddle   Reason for call:Pt is calling her daughter just got diagnosed today with the flu and the pediatrician recommended that mother call to see if she can get put on Tamiflu.  Pt uses [facility] on [address]

## 2021-10-16 NOTE — Telephone Encounter (Signed)
Dr Birdie Riddle, patient would like an Rx of Tamiflu being that daughter was diagnosed today with it. Please advise

## 2021-10-16 NOTE — Telephone Encounter (Signed)
Tamiflu has been filled and sent to patient pharmacy. 75 mg, 1 tab daily x 10 days.

## 2021-10-16 NOTE — Telephone Encounter (Signed)
[facility] for Tamiflu 75mg  daily, 1 tab daily x10 days

## 2021-10-25 ENCOUNTER — Ambulatory Visit (INDEPENDENT_AMBULATORY_CARE_PROVIDER_SITE_OTHER): Payer: 59 | Admitting: Clinical

## 2021-10-25 ENCOUNTER — Other Ambulatory Visit (HOSPITAL_COMMUNITY): Payer: Self-pay

## 2021-10-25 ENCOUNTER — Ambulatory Visit (INDEPENDENT_AMBULATORY_CARE_PROVIDER_SITE_OTHER): Payer: 59 | Admitting: Family Medicine

## 2021-10-25 ENCOUNTER — Other Ambulatory Visit: Payer: Self-pay

## 2021-10-25 ENCOUNTER — Encounter (INDEPENDENT_AMBULATORY_CARE_PROVIDER_SITE_OTHER): Payer: Self-pay | Admitting: Family Medicine

## 2021-10-25 VITALS — BP 136/93 | HR 78 | Temp 97.9°F | Ht 62.0 in | Wt 197.0 lb

## 2021-10-25 DIAGNOSIS — Z6841 Body Mass Index (BMI) 40.0 and over, adult: Secondary | ICD-10-CM | POA: Diagnosis not present

## 2021-10-25 DIAGNOSIS — R7301 Impaired fasting glucose: Secondary | ICD-10-CM | POA: Diagnosis not present

## 2021-10-25 DIAGNOSIS — G43809 Other migraine, not intractable, without status migrainosus: Secondary | ICD-10-CM | POA: Diagnosis not present

## 2021-10-25 DIAGNOSIS — Z79899 Other long term (current) drug therapy: Secondary | ICD-10-CM

## 2021-10-25 DIAGNOSIS — R5383 Other fatigue: Secondary | ICD-10-CM | POA: Diagnosis not present

## 2021-10-25 DIAGNOSIS — E7849 Other hyperlipidemia: Secondary | ICD-10-CM

## 2021-10-25 DIAGNOSIS — E559 Vitamin D deficiency, unspecified: Secondary | ICD-10-CM

## 2021-10-25 DIAGNOSIS — F4323 Adjustment disorder with mixed anxiety and depressed mood: Secondary | ICD-10-CM

## 2021-10-25 MED ORDER — TROKENDI XR 100 MG PO CP24
100.0000 mg | ORAL_CAPSULE | Freq: Every day | ORAL | 3 refills | Status: DC
  Filled 2021-10-25: qty 90, 90d supply, fill #0

## 2021-10-25 MED ORDER — SEMAGLUTIDE (2 MG/DOSE) 8 MG/3ML ~~LOC~~ SOPN
2.0000 mg | PEN_INJECTOR | SUBCUTANEOUS | 0 refills | Status: DC
  Filled 2021-10-25: qty 3, 28d supply, fill #0
  Filled 2021-11-15: qty 3, 28d supply, fill #1

## 2021-10-26 LAB — LIPID PANEL WITH LDL/HDL RATIO
Cholesterol, Total: 219 mg/dL — ABNORMAL HIGH (ref 100–199)
HDL: 42 mg/dL (ref >39)
LDL Chol Calc (NIH): 150 mg/dL — ABNORMAL HIGH (ref 0–99)
LDL/HDL Ratio: 3.6 ratio — ABNORMAL HIGH (ref 0.0–3.2)
Triglycerides: 148 mg/dL (ref 0–149)
VLDL Cholesterol Cal: 27 mg/dL (ref 5–40)

## 2021-10-26 LAB — COMPREHENSIVE METABOLIC PANEL
ALT: 18 IU/L (ref 0–32)
AST: 18 IU/L (ref 0–40)
Albumin/Globulin Ratio: 1.5 (ref 1.2–2.2)
Albumin: 4.3 g/dL (ref 3.8–4.8)
Alkaline Phosphatase: 64 IU/L (ref 44–121)
BUN/Creatinine Ratio: 19 (ref 9–23)
BUN: 16 mg/dL (ref 6–24)
Bilirubin Total: 0.3 mg/dL (ref 0.0–1.2)
CO2: 23 mmol/L (ref 20–29)
Calcium: 9.1 mg/dL (ref 8.7–10.2)
Chloride: 104 mmol/L (ref 96–106)
Creatinine, Ser: 0.86 mg/dL (ref 0.57–1.00)
Globulin, Total: 2.9 g/dL (ref 1.5–4.5)
Glucose: 83 mg/dL (ref 70–99)
Potassium: 4.2 mmol/L (ref 3.5–5.2)
Sodium: 140 mmol/L (ref 134–144)
Total Protein: 7.2 g/dL (ref 6.0–8.5)
eGFR: 87 mL/min/{1.73_m2} (ref >59)

## 2021-10-26 LAB — CBC WITH DIFFERENTIAL/PLATELET
Basophils Absolute: 0 10*3/uL (ref 0.0–0.2)
Basos: 0 %
EOS (ABSOLUTE): 0.3 10*3/uL (ref 0.0–0.4)
Eos: 5 %
Hematocrit: 39.8 % (ref 34.0–46.6)
Hemoglobin: 12.6 g/dL (ref 11.1–15.9)
Immature Grans (Abs): 0 10*3/uL (ref 0.0–0.1)
Immature Granulocytes: 0 %
Lymphocytes Absolute: 2.1 10*3/uL (ref 0.7–3.1)
Lymphs: 31 %
MCH: 27 pg (ref 26.6–33.0)
MCHC: 31.7 g/dL (ref 31.5–35.7)
MCV: 85 fL (ref 79–97)
Monocytes Absolute: 0.3 10*3/uL (ref 0.1–0.9)
Monocytes: 5 %
Neutrophils Absolute: 3.9 10*3/uL (ref 1.4–7.0)
Neutrophils: 59 %
Platelets: 468 10*3/uL — ABNORMAL HIGH (ref 150–450)
RBC: 4.67 x10E6/uL (ref 3.77–5.28)
RDW: 12.9 % (ref 11.7–15.4)
WBC: 6.7 10*3/uL (ref 3.4–10.8)

## 2021-10-26 LAB — ANEMIA PANEL
Ferritin: 13 ng/mL — ABNORMAL LOW (ref 15–150)
Folate, Hemolysate: 338 ng/mL
Folate, RBC: 716 ng/mL (ref >498)
Hematocrit: 47.2 % — ABNORMAL HIGH (ref 34.0–46.6)
Iron Saturation: 16 % (ref 15–55)
Iron: 76 ug/dL (ref 27–159)
Retic Ct Pct: 0.8 % (ref 0.6–2.6)
Total Iron Binding Capacity: 475 ug/dL — ABNORMAL HIGH (ref 250–450)
UIBC: 399 ug/dL (ref 131–425)
Vitamin B-12: 373 pg/mL (ref 232–1245)

## 2021-10-26 LAB — VITAMIN B12: Vitamin B-12: 469 pg/mL (ref 232–1245)

## 2021-10-26 LAB — VITAMIN D 25 HYDROXY (VIT D DEFICIENCY, FRACTURES): Vit D, 25-Hydroxy: 46.7 ng/mL (ref 30.0–100.0)

## 2021-10-26 LAB — HEMOGLOBIN A1C
Est. average glucose Bld gHb Est-mCnc: 117 mg/dL
Hgb A1c MFr Bld: 5.7 % — ABNORMAL HIGH (ref 4.8–5.6)

## 2021-10-26 LAB — INSULIN, RANDOM: INSULIN: 13.8 u[IU]/mL (ref 2.6–24.9)

## 2021-10-26 LAB — T4, FREE: Free T4: 1.14 ng/dL (ref 0.82–1.77)

## 2021-10-26 LAB — TSH: TSH: 1.24 u[IU]/mL (ref 0.450–4.500)

## 2021-10-26 NOTE — Progress Notes (Signed)
Chief Complaint:   OBESITY Cynthia [name] is here to discuss her progress with her obesity treatment plan along with follow-up of her obesity related diagnoses. See Medical Weight Management Flowsheet for complete bioelectrical impedance results.  Today's visit was #: 68 Starting weight: 223 lbs Starting date: 07/20/2020 Weight change since last visit: 6 lbs Total lbs lost to date: 26 lbs Total weight loss percentage to date: -11.66%  Nutrition Plan: Keeping a food journal and adhering to recommended goals of 1400-1500 calories and 95 grams of protein daily for 90% of the time. Activity: Spin class for 30 minutes 2 times per week.  Anti-obesity medications: Ozempic 2 mg subcutaneously weekly. Reported side effects: None.  Interim History: Cynthia [name] recently had the flu.  She says she likes her medication regimen.  Assessment/Plan:   1. Impaired fasting glucose Refill Ozempic 2 mg subcutaneously weekly.  Will check labs today, as per below.  - Refill Semaglutide, 2 MG/DOSE, 8 MG/3ML SOPN; Inject 2 mg as directed once a week.  Dispense: 9 mL; Refill: 0 - Comprehensive metabolic panel - Hemoglobin A1c - Insulin, random  2. Other migraine without status migrainosus, not intractable [name] is taking Trokendi XR 100 mg at bedtime.  This is working well for her, so will refill today, as per below.  - Refill Topiramate ER (TROKENDI XR) 100 MG CP24; Take 1 capsule (100mg ) by mouth at bedtime.  Dispense: 90 capsule; Refill: 3  3. Vitamin D deficiency Not optimized.  She is taking vitamin D 50,000 IU weekly.  Plan: Continue to take prescription Vitamin D @50 ,000 IU every week as prescribed.  Will check vitamin D level today.  Lab Results  Component Value Date   VD25OH 46.7 10/25/2021   VD25OH 16.3 (L) 07/20/2020   VD25OH 11.09 (L) 08/12/2019   - VITAMIN D 25 Hydroxy (Vit-D Deficiency, Fractures)  4. Other hyperlipidemia Course: Not at goal. Lipid-lowering medications: None.   Plan:  Dietary changes: Increase soluble fiber, decrease simple carbohydrates, decrease saturated fat. Exercise changes: Moderate to vigorous-intensity aerobic activity 150 minutes per week or as tolerated. We will continue to monitor along with PCP/specialists as it pertains to her weight loss journey.  Will check labs today.  Lab Results  Component Value Date   CHOL 219 (H) 10/25/2021   HDL 42 10/25/2021   LDLCALC 150 (H) 10/25/2021   LDLDIRECT 110.0 12/28/2015   TRIG 148 10/25/2021   CHOLHDL 5 08/12/2019   Lab Results  Component Value Date   ALT 18 10/25/2021   AST 18 10/25/2021   ALKPHOS 64 10/25/2021   BILITOT 0.3 10/25/2021   The 10-year ASCVD risk score (Arnett DK, et al., 2019) is: 1.4%   Values used to calculate the score:     Age: 3 years     Sex: Female     Is Non-Hispanic African American: No     Diabetic: No     Tobacco smoker: No     Systolic Blood Pressure: 010 mmHg     Is BP treated: No     HDL Cholesterol: 42 mg/dL     Total Cholesterol: 219 mg/dL  - Lipid Panel With LDL/HDL Ratio  5. Medication management Will check vitamin B12 level today.  - Vitamin B12  6. Other fatigue Cynthia [name] is feeling more fatigued than usual, so we will check her TSH, free T4, and an anemia panel today.  - CBC with Differential/Platelet - T4, free - TSH - Anemia panel  7. Obesity BMI today is  37  Course: Cynthia [name] is currently in the action stage of change. As such, her goal is to continue with weight loss efforts.   Nutrition goals: She has agreed to keeping a food journal and adhering to recommended goals of 1400-1500 calories and 95 grams of protein.   Exercise goals:  As is.  Behavioral modification strategies: increasing lean protein intake, decreasing simple carbohydrates, increasing vegetables, and increasing water intake.  Dhara has agreed to follow-up with our clinic in 4 weeks. She was informed of the importance of frequent follow-up visits to maximize her success with  intensive lifestyle modifications for her multiple health conditions.   Cynthia [name] was informed we would discuss her lab results at her next visit unless there is a critical issue that needs to be addressed sooner. Cynthia [name] agreed to keep her next visit at the agreed upon time to discuss these results.  Objective:   Blood pressure (!) 136/93, pulse 78, temperature 97.9 F (36.6 C), temperature source Oral, height 5\' 2"  (1.575 m), weight 197 lb (89.4 kg), SpO2 99 %. Body mass index is 36.03 kg/m.  General: Cooperative, alert, well developed, in no acute distress. HEENT: Conjunctivae and lids unremarkable. Cardiovascular: Regular rhythm.  Lungs: Normal work of breathing. Neurologic: No focal deficits.   Lab Results  Component Value Date   CREATININE 0.86 10/25/2021   BUN 16 10/25/2021   NA 140 10/25/2021   K 4.2 10/25/2021   CL 104 10/25/2021   CO2 23 10/25/2021   Lab Results  Component Value Date   ALT 18 10/25/2021   AST 18 10/25/2021   ALKPHOS 64 10/25/2021   BILITOT 0.3 10/25/2021   Lab Results  Component Value Date   HGBA1C 5.7 (H) 10/25/2021   HGBA1C 6.1 (H) 07/20/2020   HGBA1C 4.5 (L) 03/18/2018   HGBA1C 5.5 01/16/2017   HGBA1C 5.9 12/28/2015   Lab Results  Component Value Date   INSULIN 13.8 10/25/2021   INSULIN 25.6 (H) 07/20/2020   Lab Results  Component Value Date   TSH 1.240 10/25/2021   Lab Results  Component Value Date   CHOL 219 (H) 10/25/2021   HDL 42 10/25/2021   LDLCALC 150 (H) 10/25/2021   LDLDIRECT 110.0 12/28/2015   TRIG 148 10/25/2021   CHOLHDL 5 08/12/2019   Lab Results  Component Value Date   VD25OH 46.7 10/25/2021   VD25OH 16.3 (L) 07/20/2020   VD25OH 11.09 (L) 08/12/2019   Lab Results  Component Value Date   WBC 6.7 10/25/2021   HGB 12.6 10/25/2021   HCT 47.2 (H) 10/25/2021   MCV 85 10/25/2021   PLT 468 (H) 10/25/2021   Lab Results  Component Value Date   IRON 76 10/25/2021   TIBC 475 (H) 10/25/2021   FERRITIN 13 (L)  10/25/2021   Attestation Statements:   Reviewed by clinician on day of visit: allergies, medications, problem list, medical history, surgical history, family history, social history, and previous encounter notes.  I, Water quality scientist, CMA, am acting as transcriptionist for Briscoe Deutscher, DO  I have reviewed the above documentation for accuracy and completeness, and I agree with the above. -  Briscoe Deutscher, DO, MS, FAAFP, DABOM - Family and Bariatric Medicine.

## 2021-11-06 ENCOUNTER — Other Ambulatory Visit (HOSPITAL_COMMUNITY): Payer: Self-pay

## 2021-11-06 ENCOUNTER — Other Ambulatory Visit: Payer: Self-pay | Admitting: Family Medicine

## 2021-11-06 ENCOUNTER — Other Ambulatory Visit (INDEPENDENT_AMBULATORY_CARE_PROVIDER_SITE_OTHER): Payer: Self-pay | Admitting: Family Medicine

## 2021-11-06 DIAGNOSIS — F32A Depression, unspecified: Secondary | ICD-10-CM

## 2021-11-06 MED ORDER — NORETHIN ACE-ETH ESTRAD-FE 1-20 MG-MCG PO TABS
1.0000 | ORAL_TABLET | Freq: Every day | ORAL | 4 refills | Status: DC
  Filled 2021-11-06 – 2021-11-15 (×2): qty 84, 84d supply, fill #0
  Filled 2021-12-21: qty 28, 28d supply, fill #1
  Filled 2022-02-15: qty 28, 28d supply, fill #2
  Filled 2022-03-12: qty 28, 28d supply, fill #3
  Filled 2022-04-27: qty 28, 28d supply, fill #4
  Filled 2022-05-22: qty 84, 84d supply, fill #5
  Filled 2022-08-07: qty 84, 84d supply, fill #6
  Filled 2022-10-19: qty 56, 56d supply, fill #7

## 2021-11-07 ENCOUNTER — Other Ambulatory Visit (HOSPITAL_COMMUNITY): Payer: Self-pay

## 2021-11-07 MED ORDER — ESCITALOPRAM OXALATE 20 MG PO TABS
20.0000 mg | ORAL_TABLET | Freq: Every day | ORAL | 1 refills | Status: DC
  Filled 2021-11-07 – 2021-11-15 (×2): qty 90, 90d supply, fill #0
  Filled 2022-02-20: qty 90, 90d supply, fill #1

## 2021-11-14 ENCOUNTER — Other Ambulatory Visit (HOSPITAL_COMMUNITY): Payer: Self-pay

## 2021-11-15 ENCOUNTER — Other Ambulatory Visit (HOSPITAL_COMMUNITY): Payer: Self-pay

## 2021-11-16 ENCOUNTER — Other Ambulatory Visit (HOSPITAL_COMMUNITY): Payer: Self-pay

## 2021-11-17 ENCOUNTER — Ambulatory Visit: Payer: 59 | Admitting: Clinical

## 2021-11-20 ENCOUNTER — Ambulatory Visit: Payer: 59

## 2021-11-29 ENCOUNTER — Other Ambulatory Visit: Payer: Self-pay

## 2021-11-29 ENCOUNTER — Other Ambulatory Visit (HOSPITAL_COMMUNITY): Payer: Self-pay

## 2021-11-29 ENCOUNTER — Encounter (INDEPENDENT_AMBULATORY_CARE_PROVIDER_SITE_OTHER): Payer: Self-pay | Admitting: Family Medicine

## 2021-11-29 ENCOUNTER — Ambulatory Visit (INDEPENDENT_AMBULATORY_CARE_PROVIDER_SITE_OTHER): Payer: 59 | Admitting: Family Medicine

## 2021-11-29 VITALS — BP 144/87 | HR 82 | Temp 98.0°F | Ht 62.0 in | Wt 193.0 lb

## 2021-11-29 DIAGNOSIS — R7301 Impaired fasting glucose: Secondary | ICD-10-CM

## 2021-11-29 DIAGNOSIS — F439 Reaction to severe stress, unspecified: Secondary | ICD-10-CM

## 2021-11-29 DIAGNOSIS — Z6835 Body mass index (BMI) 35.0-35.9, adult: Secondary | ICD-10-CM

## 2021-11-29 DIAGNOSIS — Z724 Inappropriate diet and eating habits: Secondary | ICD-10-CM

## 2021-11-29 DIAGNOSIS — R632 Polyphagia: Secondary | ICD-10-CM

## 2021-11-29 MED ORDER — WEGOVY 2.4 MG/0.75ML ~~LOC~~ SOAJ
2.4000 mg | SUBCUTANEOUS | 0 refills | Status: DC
  Filled 2021-11-29 – 2021-12-21 (×2): qty 3, 28d supply, fill #0

## 2021-11-30 ENCOUNTER — Encounter (INDEPENDENT_AMBULATORY_CARE_PROVIDER_SITE_OTHER): Payer: Self-pay

## 2021-11-30 NOTE — Progress Notes (Signed)
Chief Complaint:   OBESITY Cynthia [name] is here to discuss her progress with her obesity treatment plan along with follow-up of her obesity related diagnoses. See Medical Weight Management Flowsheet for complete bioelectrical impedance results.  Today's visit was #: 36 Starting weight: 223 lbs Starting date: 07/20/2020 Weight change since last visit: 4 lbs Total lbs lost to date: 30 lbs Total weight loss percentage to date: -13.45%  Nutrition Plan: Keeping a food journal and adhering to recommended goals of 1400-1500 calories and 95 grams of protein daily for 50% of the time. Activity: None. Anti-obesity medications: Ozempic 2 mg subcutaneously weekly. Reported side effects: None.  Interim History: Cynthia [name] is down 30 pounds.  She says she does not have a stove right now.  She has been eating out a lot.  Assessment/Plan:   1. Polyphagia Controlled. Current treatment: Ozempic 2 mg subcutaneously weekly.    Plan: Start Wegovy to 2.4 mg subcutaneously weekly, as per below.  She will continue to focus on protein-rich, low simple carbohydrate foods. We reviewed the importance of hydration, regular exercise for stress reduction, and restorative sleep.  - Start Semaglutide-Weight Management (WEGOVY) 2.4 MG/0.75ML SOAJ; Inject 2.4 mg into the skin once a week.  Dispense: 3 mL; Refill: 0  2. Impaired fasting glucose Cynthia [name] will start Wegovy 2.4 mg subcutaneously weekly.  3. Situational stress Cynthia [name] is taking Lexapro 20 mg daily to help with anxiety and stress.  She will continue this and we will continue to monitor as it relates to her weight loss journey.  4. Emotional eating tendencies Improving, but not optimized. Medication: Trokendi 100 mg at bedtime.   Plan:  Continue Trokendi 100 mg at bedtime. Discussed cues and consequences, how thoughts affect eating, model of thoughts, feelings, and behaviors, and strategies for change by focusing on the cue. Discussed cognitive distortions,  coping thoughts, and how to change your thoughts.  5. Obesity, current BMI 35.4  Course: Cynthia [name] is currently in the action stage of change. As such, her goal is to continue with weight loss efforts.   Nutrition goals: She has agreed to keeping a food journal and adhering to recommended goals of 1400-1500 calories and 95 grams of protein.   Exercise goals:  As is.  Behavioral modification strategies: increasing lean protein intake, decreasing simple carbohydrates, increasing vegetables, and increasing water intake.  Cynthia [name] has agreed to follow-up with our clinic in 4 weeks. She was informed of the importance of frequent follow-up visits to maximize her success with intensive lifestyle modifications for her multiple health conditions.   Objective:   Blood pressure (!) 144/87, pulse 82, temperature 98 F (36.7 C), temperature source Oral, height 5\' 2"  (1.575 m), weight 193 lb (87.5 kg), SpO2 100 %. Body mass index is 35.3 kg/m.  General: Cooperative, alert, well developed, in no acute distress. HEENT: Conjunctivae and lids unremarkable. Cardiovascular: Regular rhythm.  Lungs: Normal work of breathing. Neurologic: No focal deficits.   Lab Results  Component Value Date   CREATININE 0.86 10/25/2021   BUN 16 10/25/2021   NA 140 10/25/2021   K 4.2 10/25/2021   CL 104 10/25/2021   CO2 23 10/25/2021   Lab Results  Component Value Date   ALT 18 10/25/2021   AST 18 10/25/2021   ALKPHOS 64 10/25/2021   BILITOT 0.3 10/25/2021   Lab Results  Component Value Date   HGBA1C 5.7 (H) 10/25/2021   HGBA1C 6.1 (H) 07/20/2020   HGBA1C 4.5 (L) 03/18/2018   HGBA1C 5.5 01/16/2017  HGBA1C 5.9 12/28/2015   Lab Results  Component Value Date   INSULIN 13.8 10/25/2021   INSULIN 25.6 (H) 07/20/2020   Lab Results  Component Value Date   TSH 1.240 10/25/2021   Lab Results  Component Value Date   CHOL 219 (H) 10/25/2021   HDL 42 10/25/2021   LDLCALC 150 (H) 10/25/2021   LDLDIRECT 110.0  12/28/2015   TRIG 148 10/25/2021   CHOLHDL 5 08/12/2019   Lab Results  Component Value Date   VD25OH 46.7 10/25/2021   VD25OH 16.3 (L) 07/20/2020   VD25OH 11.09 (L) 08/12/2019   Lab Results  Component Value Date   WBC 6.7 10/25/2021   HGB 12.6 10/25/2021   HCT 47.2 (H) 10/25/2021   MCV 85 10/25/2021   PLT 468 (H) 10/25/2021   Lab Results  Component Value Date   IRON 76 10/25/2021   TIBC 475 (H) 10/25/2021   FERRITIN 13 (L) 10/25/2021   Attestation Statements:   Reviewed by clinician on day of visit: allergies, medications, problem list, medical history, surgical history, family history, social history, and previous encounter notes.  I, Water quality scientist, CMA, am acting as transcriptionist for Briscoe Deutscher, DO  I have reviewed the above documentation for accuracy and completeness, and I agree with the above. -  Briscoe Deutscher, DO, MS, FAAFP, DABOM - Family and Bariatric Medicine.

## 2021-12-20 ENCOUNTER — Ambulatory Visit (INDEPENDENT_AMBULATORY_CARE_PROVIDER_SITE_OTHER): Payer: 59 | Admitting: Clinical

## 2021-12-20 DIAGNOSIS — F4323 Adjustment disorder with mixed anxiety and depressed mood: Secondary | ICD-10-CM

## 2021-12-20 NOTE — Progress Notes (Signed)
Diagnosis: F43.23 Time: 10:00am-11:00am CPT: 66060O-45  Azzie was seen remotely using secure video conferencing. She and the therapist were in their respective homes at the time of the appointment. She shared that she had had a challenging few weeks during which her family had fallen ill with both flu and COVID, and she had continued to process her daughter's diagnosis in a new light after changes to her service plan. Therapist provided an opportunity to process these feelings, offering validation and support and pointing out black or white thinking. She is scheduled to be seen again in one month.  Treatment Plan Client Abilities/Strengths  Gianny is seeking CBT to help her cope with her daughter's ASD diagnosis and pursue appropriate treatments. She shared that she is also interested in regaining motivation to achieve previous levels of discipline in her lifestyle.  Client Treatment Preferences  Redina prefers remote appointments that occur while her children are in school, between 60:30am-12:00am  Client Statement of Needs  Karlita is seeking CBT to help her cope with and navigate her daughter's diagnosis, as well as establishing and maintaining a self-care routine  Treatment Level  Monthly  Symptoms  Anxiety: sleep irregularities, excessive worry (Status: maintained). Depression: frequent headaches, weight gain, fatigue, sleep difficulties (Status: maintained).  Problems Addressed  New Description, New Description  Goals 1. Sheanna has struggled to maintain self care in recent years Objective Rashanna would like to increase her understanding of her daughter in order to better address her needs Target Date: 2022-08-16 Frequency: Monthly  Progress: 0 Modality: individual  Related Interventions Therapist will incorporate manualized treatments including Parent Training for Disruptive Behaviors in ASD  Therapist will provide referrals for additional resources as appropriate  Objective Jerolyn  would like to establish a consistent self care routine  Target Date: 2022-08-16 Frequency: Monthly  Progress: 0 Modality: individual  Related Interventions Therapist will incorporate CBT based strategies to help Jeany identify and disengage from maladaptive thoughts and behaviors Clatie will be provided opportunities to process experiences in session 2. Lakie's daughter was diagnosed with ASD in spring of 2022 Diagnosis Axis none 309.28 (Adjustment disorder with mixed anxiety and depressed mood) - Open - [Signifier: n/a]    Conditions For Discharge Achievement of treatment goals and objectives Therapist Signature ___________________________ Patient Signature ___________________________       [name], PhD

## 2021-12-21 ENCOUNTER — Other Ambulatory Visit (HOSPITAL_COMMUNITY): Payer: Self-pay

## 2021-12-21 ENCOUNTER — Encounter: Payer: 59 | Admitting: Family Medicine

## 2021-12-26 ENCOUNTER — Ambulatory Visit: Payer: 59

## 2021-12-29 ENCOUNTER — Ambulatory Visit
Admission: RE | Admit: 2021-12-29 | Discharge: 2021-12-29 | Disposition: A | Discharge status: Home / Self Care | Payer: 59 | Source: Ambulatory Visit | Attending: Family Medicine | Admitting: Family Medicine

## 2021-12-29 DIAGNOSIS — Z1231 Encounter for screening mammogram for malignant neoplasm of breast: Secondary | ICD-10-CM | POA: Diagnosis not present

## 2022-01-01 ENCOUNTER — Other Ambulatory Visit: Payer: Self-pay | Admitting: Family Medicine

## 2022-01-01 DIAGNOSIS — R928 Other abnormal and inconclusive findings on diagnostic imaging of breast: Secondary | ICD-10-CM

## 2022-01-14 ENCOUNTER — Other Ambulatory Visit (INDEPENDENT_AMBULATORY_CARE_PROVIDER_SITE_OTHER): Payer: Self-pay | Admitting: Family Medicine

## 2022-01-14 DIAGNOSIS — R632 Polyphagia: Secondary | ICD-10-CM

## 2022-01-15 ENCOUNTER — Ambulatory Visit (INDEPENDENT_AMBULATORY_CARE_PROVIDER_SITE_OTHER): Payer: 59 | Admitting: Clinical

## 2022-01-15 ENCOUNTER — Other Ambulatory Visit (HOSPITAL_COMMUNITY): Payer: Self-pay

## 2022-01-15 DIAGNOSIS — F4323 Adjustment disorder with mixed anxiety and depressed mood: Secondary | ICD-10-CM | POA: Diagnosis not present

## 2022-01-15 MED ORDER — WEGOVY 2.4 MG/0.75ML ~~LOC~~ SOAJ
2.4000 mg | SUBCUTANEOUS | 0 refills | Status: DC
  Filled 2022-01-15: qty 3, 28d supply, fill #0

## 2022-01-15 NOTE — Progress Notes (Signed)
                Sarinah Doetsch L Kielee Care, PhD 

## 2022-01-15 NOTE — Telephone Encounter (Signed)
Last OV with Dr Wallace 

## 2022-01-15 NOTE — Progress Notes (Signed)
Diagnosis: F43.23 Time: 10:00am-11:00am CPT: 98338S-50  Cynthia [name] was seen remotely using secure video conferencing. She was in her home and the therapist was in her office at the time of the appointment. Session focused on providing validation, support, and guidance in considering next steps for her daughter's evaluation and care. Cynthia [name] is scheduled to be seen again in three weeks.    Treatment Plan Client Abilities/Strengths  Cynthia [name] is seeking CBT to help her cope with her daughter's ASD diagnosis and pursue appropriate treatments. She shared that she is also interested in regaining motivation to achieve previous levels of discipline in her lifestyle.  Client Treatment Preferences  Cynthia [name] prefers remote appointments that occur while her children are in school, between 75:30am-12:00am  Client Statement of Needs  Cynthia [name] is seeking CBT to help her cope with and navigate her daughter's diagnosis, as well as establishing and maintaining a self-care routine  Treatment Level  Monthly  Symptoms  Anxiety: sleep irregularities, excessive worry (Status: maintained). Depression: frequent headaches, weight gain, fatigue, sleep difficulties (Status: maintained).  Problems Addressed  New Description, New Description  Goals 1. Cynthia [name] has struggled to maintain self care in recent years Objective Cynthia [name] would like to increase her understanding of her daughter in order to better address her needs Target Date: 2022-08-16 Frequency: Monthly  Progress: 0 Modality: individual  Related Interventions Therapist will incorporate manualized treatments including Parent Training for Disruptive Behaviors in ASD  Therapist will provide referrals for additional resources as appropriate  Objective Cynthia [name] would like to establish a consistent self care routine  Target Date: 2022-08-16 Frequency: Monthly  Progress: 0 Modality: individual  Related Interventions Therapist will incorporate CBT based strategies to help  Cynthia [name] identify and disengage from maladaptive thoughts and behaviors Cynthia [name] will be provided opportunities to process experiences in session 2. Cynthia [name]'s daughter was diagnosed with ASD in spring of 2022 Diagnosis Axis none 309.28 (Adjustment disorder with mixed anxiety and depressed mood) - Open - [Signifier: n/a]    Conditions For Discharge Achievement of treatment goals and objectives Therapist Signature ___________________________ Patient Signature ___________________________       [name], PhD        [name], PhD

## 2022-01-16 ENCOUNTER — Other Ambulatory Visit (HOSPITAL_COMMUNITY): Payer: Self-pay

## 2022-01-24 ENCOUNTER — Ambulatory Visit (INDEPENDENT_AMBULATORY_CARE_PROVIDER_SITE_OTHER): Payer: 59 | Admitting: Family Medicine

## 2022-02-01 ENCOUNTER — Other Ambulatory Visit: Payer: Self-pay | Admitting: Family Medicine

## 2022-02-01 ENCOUNTER — Ambulatory Visit
Admission: RE | Admit: 2022-02-01 | Discharge: 2022-02-01 | Disposition: A | Discharge status: Home / Self Care | Payer: 59 | Source: Ambulatory Visit | Attending: Family Medicine | Admitting: Family Medicine

## 2022-02-01 DIAGNOSIS — R928 Other abnormal and inconclusive findings on diagnostic imaging of breast: Secondary | ICD-10-CM

## 2022-02-01 DIAGNOSIS — N6313 Unspecified lump in the right breast, lower outer quadrant: Secondary | ICD-10-CM | POA: Diagnosis not present

## 2022-02-01 DIAGNOSIS — N6310 Unspecified lump in the right breast, unspecified quadrant: Secondary | ICD-10-CM

## 2022-02-01 DIAGNOSIS — R922 Inconclusive mammogram: Secondary | ICD-10-CM | POA: Diagnosis not present

## 2022-02-01 DIAGNOSIS — N6311 Unspecified lump in the right breast, upper outer quadrant: Secondary | ICD-10-CM | POA: Diagnosis not present

## 2022-02-05 ENCOUNTER — Ambulatory Visit (INDEPENDENT_AMBULATORY_CARE_PROVIDER_SITE_OTHER): Payer: 59 | Admitting: Clinical

## 2022-02-05 DIAGNOSIS — F4323 Adjustment disorder with mixed anxiety and depressed mood: Secondary | ICD-10-CM

## 2022-02-05 NOTE — Progress Notes (Signed)
Diagnosis: F43.23 Time: 10:15am-10:53 am CPT: 61950D-32  [name] was seen remotely using secure video conferencing. She was in her home and the therapist was in her home at the time of the appointment. She shared developments in her children's school and therapy enrollment for the coming year, and therapist provided suggestions and support. She also shared that a recent mammogram had resulted in a biopsy of a concerning mass. She has been focusing on planning as a coping strategy and reported minimal overall stress. She is scheduled to be seen again in one month.   Treatment Plan Client Abilities/Strengths  [name] is seeking CBT to help her cope with her daughter's ASD diagnosis and pursue appropriate treatments. She shared that she is also interested in regaining motivation to achieve previous levels of discipline in her lifestyle.  Client Treatment Preferences  Abigail prefers remote appointments that occur while her children are in school, between 52:30am-12:00am  Client Statement of Needs  [name] is seeking CBT to help her cope with and navigate her daughter's diagnosis, as well as establishing and maintaining a self-care routine  Treatment Level  Monthly  Symptoms  Anxiety: sleep irregularities, excessive worry (Status: maintained). Depression: frequent headaches, weight gain, fatigue, sleep difficulties (Status: maintained).  Problems Addressed  New Description, New Description  Goals 1. [name] has struggled to maintain self care in recent years Objective Sweta would like to increase her understanding of her daughter in order to better address her needs Target Date: 2022-08-16 Frequency: Monthly  Progress: 0 Modality: individual  Related Interventions Therapist will incorporate manualized treatments including Parent Training for Disruptive Behaviors in ASD  Therapist will provide referrals for additional resources as appropriate  Objective [name] would like to establish a consistent  self care routine  Target Date: 2022-08-16 Frequency: Monthly  Progress: 0 Modality: individual  Related Interventions Therapist will incorporate CBT based strategies to help [name] identify and disengage from maladaptive thoughts and behaviors [name] will be provided opportunities to process experiences in session 2. Via's daughter was diagnosed with ASD in spring of 2022 Diagnosis Axis none 309.28 (Adjustment disorder with mixed anxiety and depressed mood) - Open - [Signifier: n/a]    Conditions For Discharge Achievement of treatment goals and objectives Therapist Signature ___________________________ Patient Signature ___________________________       [name], PhD        [name], PhD               [name], PhD

## 2022-02-12 ENCOUNTER — Telehealth: Payer: Self-pay | Admitting: Family Medicine

## 2022-02-12 NOTE — Telephone Encounter (Signed)
Pt called in asking if she can come here and get the shingles shot/ ? ?Please advise  ?

## 2022-02-12 NOTE — Telephone Encounter (Signed)
Pt is aware of this.  °

## 2022-02-15 ENCOUNTER — Ambulatory Visit
Admission: RE | Admit: 2022-02-15 | Discharge: 2022-02-15 | Disposition: A | Discharge status: Home / Self Care | Payer: 59 | Source: Ambulatory Visit | Attending: Family Medicine | Admitting: Family Medicine

## 2022-02-15 ENCOUNTER — Other Ambulatory Visit (HOSPITAL_COMMUNITY): Payer: Self-pay

## 2022-02-15 ENCOUNTER — Other Ambulatory Visit (INDEPENDENT_AMBULATORY_CARE_PROVIDER_SITE_OTHER): Payer: Self-pay | Admitting: Family Medicine

## 2022-02-15 ENCOUNTER — Other Ambulatory Visit: Payer: Self-pay

## 2022-02-15 DIAGNOSIS — N6310 Unspecified lump in the right breast, unspecified quadrant: Secondary | ICD-10-CM

## 2022-02-15 DIAGNOSIS — D241 Benign neoplasm of right breast: Secondary | ICD-10-CM | POA: Diagnosis not present

## 2022-02-15 DIAGNOSIS — R632 Polyphagia: Secondary | ICD-10-CM

## 2022-02-15 DIAGNOSIS — N6315 Unspecified lump in the right breast, overlapping quadrants: Secondary | ICD-10-CM | POA: Diagnosis not present

## 2022-02-15 HISTORY — PX: BREAST BIOPSY: SHX20

## 2022-02-20 ENCOUNTER — Other Ambulatory Visit (INDEPENDENT_AMBULATORY_CARE_PROVIDER_SITE_OTHER): Payer: Self-pay | Admitting: Family Medicine

## 2022-02-20 ENCOUNTER — Other Ambulatory Visit (HOSPITAL_COMMUNITY): Payer: Self-pay

## 2022-02-20 DIAGNOSIS — R632 Polyphagia: Secondary | ICD-10-CM

## 2022-02-20 NOTE — Telephone Encounter (Signed)
LOV w/ Dr. Juleen China

## 2022-02-22 ENCOUNTER — Ambulatory Visit (INDEPENDENT_AMBULATORY_CARE_PROVIDER_SITE_OTHER): Payer: 59 | Admitting: Family Medicine

## 2022-02-22 ENCOUNTER — Other Ambulatory Visit: Payer: Self-pay

## 2022-02-22 ENCOUNTER — Other Ambulatory Visit (HOSPITAL_COMMUNITY): Payer: Self-pay

## 2022-02-22 ENCOUNTER — Encounter (INDEPENDENT_AMBULATORY_CARE_PROVIDER_SITE_OTHER): Payer: Self-pay | Admitting: Family Medicine

## 2022-02-22 VITALS — BP 143/87 | HR 83 | Temp 98.2°F | Ht 62.0 in | Wt 189.0 lb

## 2022-02-22 DIAGNOSIS — E669 Obesity, unspecified: Secondary | ICD-10-CM

## 2022-02-22 DIAGNOSIS — Z6834 Body mass index (BMI) 34.0-34.9, adult: Secondary | ICD-10-CM | POA: Diagnosis not present

## 2022-02-22 DIAGNOSIS — F5081 Binge eating disorder: Secondary | ICD-10-CM

## 2022-02-22 DIAGNOSIS — G43009 Migraine without aura, not intractable, without status migrainosus: Secondary | ICD-10-CM

## 2022-02-22 DIAGNOSIS — R7301 Impaired fasting glucose: Secondary | ICD-10-CM

## 2022-02-22 DIAGNOSIS — Z23 Encounter for immunization: Secondary | ICD-10-CM

## 2022-02-22 MED ORDER — LISDEXAMFETAMINE DIMESYLATE 20 MG PO CAPS
20.0000 mg | ORAL_CAPSULE | Freq: Every morning | ORAL | 0 refills | Status: DC
  Filled 2022-02-22: qty 30, 30d supply, fill #0

## 2022-02-22 NOTE — Progress Notes (Signed)
Pt in today for Shingles vaccine, notes her husband had Shingles rash and she does not want to have this herself. Noted that her insurance may not pay for this as it is typically indicated for 42y.o and older pt understood stated she was willing to pay to avoid getting rash  ?

## 2022-02-23 ENCOUNTER — Other Ambulatory Visit (INDEPENDENT_AMBULATORY_CARE_PROVIDER_SITE_OTHER): Payer: Self-pay | Admitting: Family Medicine

## 2022-02-23 ENCOUNTER — Other Ambulatory Visit (HOSPITAL_COMMUNITY): Payer: Self-pay

## 2022-02-23 DIAGNOSIS — R632 Polyphagia: Secondary | ICD-10-CM

## 2022-02-26 ENCOUNTER — Other Ambulatory Visit (HOSPITAL_COMMUNITY): Payer: Self-pay

## 2022-02-26 MED ORDER — WEGOVY 2.4 MG/0.75ML ~~LOC~~ SOAJ
2.4000 mg | SUBCUTANEOUS | 0 refills | Status: DC
  Filled 2022-02-26: qty 3, 28d supply, fill #0

## 2022-02-27 ENCOUNTER — Ambulatory Visit (INDEPENDENT_AMBULATORY_CARE_PROVIDER_SITE_OTHER): Payer: 59 | Admitting: Clinical

## 2022-02-27 DIAGNOSIS — F4323 Adjustment disorder with mixed anxiety and depressed mood: Secondary | ICD-10-CM | POA: Diagnosis not present

## 2022-02-27 NOTE — Progress Notes (Signed)
Diagnosis: F43.23 ?Time: 09:30am-10:00 am ?CPT: 56213Y-86 ? ?Cynthia [name] was seen remotely using secure video conferencing. She was in her home and the therapist was in her office at the time of the appointment. She shared that she has been experiencing anxiety and worry related to her husband's job. Therapist suggested several strategies for grounding in the present moment, including noticing that she is breathing, body scan meditations, and Non Sleep Deep Rest. She will try these for homework. She is scheduled to be seen again in one month. ? ?Treatment Plan ?Client Abilities/Strengths  ?Cynthia [name] is seeking CBT to help her cope with her daughter's ASD diagnosis and pursue appropriate treatments. She shared that she is also interested in regaining motivation to achieve previous levels of discipline in her lifestyle.  ?Client Treatment Preferences  ?Cynthia [name] prefers remote appointments that occur while her children are in school, between 9:30am-12:00am  ?Client Statement of Needs  ?Cynthia [name] is seeking CBT to help her cope with and navigate her daughter's diagnosis, as well as establishing and maintaining a self-care routine  ?Treatment Level  ?Monthly  ?Symptoms  ?Anxiety: sleep irregularities, excessive worry (Status: maintained). Depression: frequent headaches, weight gain, fatigue, sleep difficulties (Status: maintained).  ?Problems Addressed  ?New Description, New Description  ?Goals ?1. Cynthia [name] has struggled to maintain self care in recent years ?Objective ?Cynthia [name] would like to increase her understanding of her daughter in order to better address her needs ?Target Date: 2022-08-16 Frequency: Monthly  ?Progress: 0 Modality: individual  ?Related Interventions ?Therapist will incorporate manualized treatments including Parent Training for Disruptive Behaviors in ASD  ?Therapist will provide referrals for additional resources as appropriate  ?Objective ?Cynthia [name] would like to establish a consistent self care routine  ?Target  Date: 2022-08-16 Frequency: Monthly  ?Progress: 0 Modality: individual  ?Related Interventions ?Therapist will incorporate CBT based strategies to help Cynthia [name] identify and disengage from maladaptive thoughts and behaviors ?Cynthia [name] will be provided opportunities to process experiences in session ?2. Cynthia [name]'s daughter was diagnosed with ASD in spring of 2022 ?Diagnosis ?Axis none 309.28 (Adjustment disorder with mixed anxiety and depressed mood) - Open - [Signifier: n/a]    ?Conditions For Discharge ?Achievement of treatment goals and objectives ?Therapist Signature ___________________________ ?Patient Signature ___________________________ ? ? ? ? ? ? ?Cynthia Cruise, PhD ? ? ? ? ? ? ? ?Cynthia Cruise, PhD ? ? ? ? ? ? ? ? ? ? ? ? ? ? ?Cynthia Cruise, PhD ? ? ? ? ? ? ? ? ? ? ? ? ? ? ?Cynthia Cruise, PhD ?

## 2022-03-05 NOTE — Progress Notes (Signed)
Chief Complaint:   OBESITY Cynthia [name] is here to discuss her progress with her obesity treatment plan along with follow-up of her obesity related diagnoses. See Medical Weight Management Flowsheet for complete bioelectrical impedance results.  Today's visit was #: 63 Starting weight: 223 lbs Starting date: 07/20/2020 Weight change since last visit: 4 lbs Total lbs lost to date: 34 lbs Total weight loss percentage to date: -15.5%   Nutrition Plan: Keeping a food journal and adhering to recommended goals of 1400-1500 calories and 95 grams of protein daily for 65-70% of the time. Activity: None. Anti-obesity medications: Ozempic 2 mg subcutaneously weekly. Reported side effects: None.  Assessment/Plan:   1. Impaired fasting glucose, with polyphagia Not at goal. Current treatment: None.    Plan: She will continue to focus on protein-rich, low simple carbohydrate foods. We reviewed the importance of hydration, regular exercise for stress reduction, and restorative sleep.  2. Migraine without aura and without status migrainosus, not intractable Cynthia [name] is taking Trokedi XR 100 mg at bedtime for migraine prevention.  The current medical regimen is effective;  continue present plan and medications.  3. Binge eating disorder, ADHD Cynthia [name] will start Vyvanse 20 mg daily for BED..  The goals for treatment of BED are to reduce eating binges and to achieve healthy eating habits. Because binge eating can correlate with negative emotions, treatment may also address any other mental-health issues, such as depression.  People who binge eat feel as if they don't have control over how much they eat and have feelings of guilt or self-loathing after a binge eating episode.  The FDA has approved Vyvanse as a treatment option for binge eating disorder. Vyvanse targets the brain's reward center by increasing the levels of dopamine and norepinephrine, the chemicals of the brain responsible for feelings of  pleasure.  Mindful eating is the recommended nutritional approach to treating BED.   - Start lisdexamfetamine (VYVANSE) 20 MG capsule; Take 1 capsule (20 mg total) by mouth every morning.  Dispense: 30 capsule; Refill: 0  I have consulted the [place] Controlled Substances Registry for this patient, and feel the risk/benefit ratio today is favorable for proceeding with this prescription for a controlled substance. The patient understands monitoring parameters and red flags.   4. Obesity, current BMI 34.7  Course: Cynthia [name] is currently in the action stage of change. As such, her goal is to continue with weight loss efforts.   Nutrition goals: She has agreed to keeping a food journal and adhering to recommended goals of 1400-1500 calories and 95 grams of protein.   Exercise goals:  As is.  Behavioral modification strategies: increasing lean protein intake, decreasing simple carbohydrates, increasing vegetables, and increasing water intake.  Cynthia [name] has agreed to follow-up with our clinic in 4 weeks. She was informed of the importance of frequent follow-up visits to maximize her success with intensive lifestyle modifications for her multiple health conditions.   Objective:   Blood pressure (!) 143/87, pulse 83, temperature 98.2 F (36.8 C), height '5\' 2"'$  (1.575 m), weight 189 lb (85.7 kg), SpO2 100 %. Body mass index is 34.57 kg/m.  General: Cooperative, alert, well developed, in no acute distress. HEENT: Conjunctivae and lids unremarkable. Cardiovascular: Regular rhythm.  Lungs: Normal work of breathing. Neurologic: No focal deficits.   Lab Results  Component Value Date   CREATININE 0.86 10/25/2021   BUN 16 10/25/2021   NA 140 10/25/2021   K 4.2 10/25/2021   CL 104 10/25/2021   CO2 23 10/25/2021  Lab Results  Component Value Date   ALT 18 10/25/2021   AST 18 10/25/2021   ALKPHOS 64 10/25/2021   BILITOT 0.3 10/25/2021   Lab Results  Component Value Date   HGBA1C 5.7 (H)  10/25/2021   HGBA1C 6.1 (H) 07/20/2020   HGBA1C 4.5 (L) 03/18/2018   HGBA1C 5.5 01/16/2017   HGBA1C 5.9 12/28/2015   Lab Results  Component Value Date   INSULIN 13.8 10/25/2021   INSULIN 25.6 (H) 07/20/2020   Lab Results  Component Value Date   TSH 1.240 10/25/2021   Lab Results  Component Value Date   CHOL 219 (H) 10/25/2021   HDL 42 10/25/2021   LDLCALC 150 (H) 10/25/2021   LDLDIRECT 110.0 12/28/2015   TRIG 148 10/25/2021   CHOLHDL 5 08/12/2019   Lab Results  Component Value Date   VD25OH 46.7 10/25/2021   VD25OH 16.3 (L) 07/20/2020   VD25OH 11.09 (L) 08/12/2019   Lab Results  Component Value Date   WBC 6.7 10/25/2021   HGB 12.6 10/25/2021   HCT 47.2 (H) 10/25/2021   MCV 85 10/25/2021   PLT 468 (H) 10/25/2021   Lab Results  Component Value Date   IRON 76 10/25/2021   TIBC 475 (H) 10/25/2021   FERRITIN 13 (L) 10/25/2021   Attestation Statements:   Reviewed by clinician on day of visit: allergies, medications, problem list, medical history, surgical history, family history, social history, and previous encounter notes.  I, Water quality scientist, CMA, am acting as transcriptionist for Briscoe Deutscher, DO  I have reviewed the above documentation for accuracy and completeness, and I agree with the above. -  Briscoe Deutscher, DO, MS, FAAFP, DABOM - Family and Bariatric Medicine.

## 2022-03-12 ENCOUNTER — Other Ambulatory Visit (HOSPITAL_COMMUNITY): Payer: Self-pay

## 2022-03-13 ENCOUNTER — Other Ambulatory Visit (INDEPENDENT_AMBULATORY_CARE_PROVIDER_SITE_OTHER): Payer: Self-pay | Admitting: Family Medicine

## 2022-03-13 DIAGNOSIS — F5081 Binge eating disorder: Secondary | ICD-10-CM

## 2022-03-13 DIAGNOSIS — R632 Polyphagia: Secondary | ICD-10-CM

## 2022-03-14 MED ORDER — WEGOVY 2.4 MG/0.75ML ~~LOC~~ SOAJ
2.4000 mg | SUBCUTANEOUS | 0 refills | Status: DC
  Filled ????-??-??: fill #0

## 2022-03-14 MED ORDER — LISDEXAMFETAMINE DIMESYLATE 20 MG PO CAPS
20.0000 mg | ORAL_CAPSULE | Freq: Every morning | ORAL | 0 refills | Status: DC
  Filled ????-??-??: fill #0

## 2022-03-15 ENCOUNTER — Encounter (INDEPENDENT_AMBULATORY_CARE_PROVIDER_SITE_OTHER): Payer: Self-pay | Admitting: Family Medicine

## 2022-03-15 DIAGNOSIS — R632 Polyphagia: Secondary | ICD-10-CM

## 2022-03-15 DIAGNOSIS — G43809 Other migraine, not intractable, without status migrainosus: Secondary | ICD-10-CM

## 2022-03-15 DIAGNOSIS — F5081 Binge eating disorder: Secondary | ICD-10-CM

## 2022-03-19 ENCOUNTER — Other Ambulatory Visit (HOSPITAL_COMMUNITY): Payer: Self-pay

## 2022-03-19 ENCOUNTER — Encounter (HOSPITAL_COMMUNITY): Payer: Self-pay | Admitting: Pharmacist

## 2022-03-19 MED ORDER — WEGOVY 2.4 MG/0.75ML ~~LOC~~ SOAJ
2.4000 mg | SUBCUTANEOUS | 0 refills | Status: DC
  Filled 2022-03-19: qty 3, 28d supply, fill #0

## 2022-03-19 MED ORDER — LISDEXAMFETAMINE DIMESYLATE 20 MG PO CAPS
20.0000 mg | ORAL_CAPSULE | Freq: Every morning | ORAL | 0 refills | Status: DC
  Filled 2022-03-19 – 2022-09-24 (×2): qty 30, 30d supply, fill #0

## 2022-03-19 MED ORDER — TOPIRAMATE ER 100 MG PO CAP24
100.0000 mg | ORAL_CAPSULE | Freq: Every day | ORAL | 3 refills | Status: DC
  Filled 2022-03-19 – 2022-03-29 (×2): qty 90, 90d supply, fill #0
  Filled 2022-09-24 – 2022-12-21 (×2): qty 90, 90d supply, fill #1

## 2022-03-22 ENCOUNTER — Ambulatory Visit: Payer: 59 | Admitting: Clinical

## 2022-03-27 ENCOUNTER — Other Ambulatory Visit (HOSPITAL_COMMUNITY): Payer: Self-pay

## 2022-03-29 ENCOUNTER — Ambulatory Visit (INDEPENDENT_AMBULATORY_CARE_PROVIDER_SITE_OTHER): Payer: 59 | Admitting: Nurse Practitioner

## 2022-03-29 ENCOUNTER — Other Ambulatory Visit (HOSPITAL_COMMUNITY): Payer: Self-pay

## 2022-03-29 ENCOUNTER — Telehealth (INDEPENDENT_AMBULATORY_CARE_PROVIDER_SITE_OTHER): Payer: Self-pay

## 2022-03-29 VITALS — BP 128/87 | HR 96 | Temp 97.9°F | Ht 62.0 in | Wt 181.0 lb

## 2022-03-29 DIAGNOSIS — E7849 Other hyperlipidemia: Secondary | ICD-10-CM | POA: Diagnosis not present

## 2022-03-29 DIAGNOSIS — Z6833 Body mass index (BMI) 33.0-33.9, adult: Secondary | ICD-10-CM

## 2022-03-29 DIAGNOSIS — E669 Obesity, unspecified: Secondary | ICD-10-CM | POA: Diagnosis not present

## 2022-03-29 DIAGNOSIS — R632 Polyphagia: Secondary | ICD-10-CM

## 2022-03-29 MED ORDER — WEGOVY 2.4 MG/0.75ML ~~LOC~~ SOAJ
2.4000 mg | SUBCUTANEOUS | 0 refills | Status: DC
  Filled 2022-03-29: qty 3, 28d supply, fill #0

## 2022-03-29 NOTE — Telephone Encounter (Signed)
Pt contacted and advised that we will not be refilling the Vyvanse per Everardo Pacific NP. Pt verbalized understanding. ?

## 2022-03-30 ENCOUNTER — Other Ambulatory Visit (HOSPITAL_COMMUNITY): Payer: Self-pay

## 2022-03-30 ENCOUNTER — Encounter: Payer: Self-pay | Admitting: Family Medicine

## 2022-03-30 ENCOUNTER — Telehealth (INDEPENDENT_AMBULATORY_CARE_PROVIDER_SITE_OTHER): Payer: 59 | Admitting: Family Medicine

## 2022-03-30 DIAGNOSIS — F419 Anxiety disorder, unspecified: Secondary | ICD-10-CM

## 2022-03-30 DIAGNOSIS — F32A Depression, unspecified: Secondary | ICD-10-CM

## 2022-03-30 MED ORDER — ALPRAZOLAM 0.5 MG PO TABS
0.2500 mg | ORAL_TABLET | Freq: Two times a day (BID) | ORAL | 1 refills | Status: DC | PRN
  Filled 2022-03-30: qty 30, 15d supply, fill #0

## 2022-03-30 MED ORDER — BUSPIRONE HCL 7.5 MG PO TABS
7.5000 mg | ORAL_TABLET | Freq: Two times a day (BID) | ORAL | 3 refills | Status: DC
  Filled 2022-03-30: qty 60, 30d supply, fill #0
  Filled 2022-09-24: qty 60, 30d supply, fill #1

## 2022-03-30 NOTE — Progress Notes (Signed)
? ?Virtual Visit via Video  ? ?I connected with patient on 03/30/22 at 12:40 PM EDT by a video enabled telemedicine application and verified that I am speaking with the correct person using two identifiers. ? ?Location patient: Home ?Location provider: [facility], Office ?Persons participating in the virtual visit: Patient, Provider, [place] [facility] C) ? ?I discussed the limitations of evaluation and management by telemedicine and the availability of in person appointments. The patient expressed understanding and agreed to proceed. ? ?Subjective:  ? ?HPI:  ? ?Anxiety- pt reports she has had anxiety 'spirals' that tend to last all day when they occur.  Will not occur daily but do seem to happen at least weekly.  Pt reports sxs seem to run on a 'loop' and have been going on for 'a couple of months'.  No specific trigger.  Sxs preceded Vyvanse.  Currently on Lexapro '20mg'$  daily.  Pt reports she if functional but struggles on the days the anxiety is high.  ? ?ROS:  ? ?See pertinent positives and negatives per HPI. ? ?Patient Active Problem List  ? Diagnosis Date Noted  ? Anxiety and depression 12/31/2020  ? Pre-diabetes 11/30/2020  ? At risk for deficient intake of food 11/14/2020  ? Frequent headaches 09/28/2020  ? Vitamin D deficiency 08/12/2019  ? PCOS (polycystic ovarian syndrome) 05/06/2018  ? GERD (gastroesophageal reflux disease) 02/13/2018  ? Eustachian tube anomaly 02/13/2018  ? Depression, recurrent ([place]) 01/03/2018  ? Gestational diabetes 01/23/2017  ? Physical exam 12/28/2015  ? Pelvic adhesive disease 06/17/2015  ?  Class: Present on Admission  ? Menorrhagia with irregular cycle 12/20/2014  ? Anemia 12/20/2014  ? Hyperlipidemia 11/22/2014  ? Class 2 severe obesity with serious comorbidity and body mass index (BMI) of 38.0 to 38.9 in adult [facility]) 11/22/2014  ? Anxiety state 11/22/2014  ?  ?Social History  ? ?Tobacco Use  ? Smoking status: Former  ?  Packs/day: 1.00  ?  Years: 10.00  ?  Pack years: 10.00   ?  Types: Cigarettes  ?  Quit date: 11/23/2011  ?  Years since quitting: 10.3  ? Smokeless tobacco: Never  ?Substance Use Topics  ? Alcohol use: Yes  ?  Comment: rare  ? ? ?Current Outpatient Medications:  ?  baclofen (LIORESAL) 10 MG tablet, Take 1 tablet (10 mg total) by mouth at bedtime as needed for muscle spasms., Disp: 30 each, Rfl: 0 ?  cetirizine (ZYRTEC) 10 MG tablet, Take 10 mg by mouth daily., Disp: , Rfl:  ?  diphenhydrAMINE (BENADRYL) 50 MG capsule, Take 50 mg by mouth at bedtime as needed., Disp: , Rfl:  ?  escitalopram (LEXAPRO) 20 MG tablet, Take 1 tablet (20 mg total) by mouth daily., Disp: 90 tablet, Rfl: 1 ?  Insulin Pen Needle (BD PEN NEEDLE NANO U/F) 32G X 4 MM MISC, use as directed, Disp: 100 each, Rfl: 0 ?  lisdexamfetamine (VYVANSE) 20 MG capsule, Take 1 capsule (20 mg total) by mouth every morning., Disp: 30 capsule, Rfl: 0 ?  meloxicam (MOBIC) 15 MG tablet, Take 1 tablet (15 mg total) by mouth daily., Disp: 30 tablet, Rfl: 0 ?  norethindrone-ethinyl estradiol-FE (BLISOVI FE 1/20) 1-20 MG-MCG tablet, Take 1 tablet by mouth daily., Disp: 84 tablet, Rfl: 4 ?  Semaglutide-Weight Management (WEGOVY) 2.4 MG/0.75ML SOAJ, Inject 2.4 mg into the skin once a week., Disp: 3 mL, Rfl: 0 ?  Topiramate ER (TROKENDI XR) 100 MG CP24, Take 1 capsule ('100mg'$ ) by mouth at bedtime., Disp: 90  capsule, Rfl: 3 ?  Vitamin D, Ergocalciferol, (DRISDOL) 1.25 MG (50000 UNIT) CAPS capsule, Take 1 capsule (50,000 Units total) by mouth every 7 (seven) days., Disp: 4 capsule, Rfl: 2 ?  fluticasone (FLONASE) 50 MCG/ACT nasal spray, PLACE 2 SPRAYS INTO BOTH NOSTRILS DAILY., Disp: 16 g, Rfl: 6 ? ?No Known Allergies ? ?Objective:  ? ?There were no vitals taken for this visit. ?AAOx3, NAD ?NCAT, EOMI ?No obvious CN deficits ?Coloring WNL ?Pt is able to speak clearly, coherently without shortness of breath or increased work of breathing.  ?Thought process is linear.  Mood is appropriate.  ? ?Assessment and Plan:  ? ?Anxiety-  deteriorated.  Pt reports she will episodes where her anxiety will just spiral and it tends to last all day.  She feels that up until recently the Lexapro '20mg'$  has been very helpful but lately it's not enough.  She is frustrated b/c she can't pinpoint a trigger for her anxiety.  We discussed various options- adding Wellbutrin, adding Buspar, using just a rescue medication like Alprazolam- and after our discussion we will add Buspar 7.'5mg'$  BID and provide Alprazolam 0.'5mg'$  PRN panic.  Pt expressed understanding and is in agreement w/ plan.  ? ?Annye Asa, MD ?03/30/2022 ? ? ?

## 2022-04-09 NOTE — Progress Notes (Signed)
? ? ? ?Chief Complaint:  ? ?OBESITY ?Cynthia [name] is here to discuss her progress with her obesity treatment plan along with follow-up of her obesity related diagnoses. Cynthia [name] is on keeping a food journal and adhering to recommended goals of 1400-1500 calories and 95 grams of protein and calorie counting and states she is following her eating plan approximately doing 70% of the time. Cynthia [name] states she is doing Spin class for 45 minutes 1 times per week. ? ?Today's visit was #: 19 ?Starting weight: 223 lbs ?Starting date: 07/20/2020 ?Today's weight: 181 lb ?Today's date: 03/29/2022 ?Total lbs lost to date: 42 lbs ?Total lbs lost since last in-office visit: 8 lbs  ? ?Interim History: Cynthia [name] is doing well weight loss. She's averaging around 1433 calories and 70 grams of protein. She is substituting a protein shake for breakfast. She is using Hello Fresh. She is drinking water and unsweetened tea. She is taking Wegovy 2.4 mg. She denies side effects.  ? ?Subjective:  ? ?1. Polyphagia ?Cynthia [name] denies hunger and cravings. She is doing well with Wegovy.  ? ?2. Other hyperlipidemia ?Cynthia [name] is not currently on a statin.  ? ?Assessment/Plan:  ? ?1. Polyphagia ?Intensive lifestyle modifications are the first line treatment for this issue. Cynthia [name] will continue Wegovy 2.4 mg. We discussed side effects. We discussed several lifestyle modifications today and she will continue to work on diet, exercise and weight loss efforts. Orders and follow up as documented in patient record. ? ?Counseling ?Polyphagia is excessive hunger. ?Causes can include: low blood sugars, hypERthyroidism, PMS, lack of sleep, stress, insulin resistance, diabetes, certain medications, and diets that are deficient in protein and fiber.   ? ?- Semaglutide-Weight Management (WEGOVY) 2.4 MG/0.75ML SOAJ; Inject 2.4 mg into the skin once a week.  Dispense: 3 mL; Refill: 0 ? ?2. Other hyperlipidemia ?Cardiovascular risk and specific lipid/LDL goals reviewed.  We discussed  several lifestyle modifications today and Cynthia [name] will continue to work on diet, exercise and weight loss efforts. Orders and follow up as documented in patient record.  ? ?Counseling ?Intensive lifestyle modifications are the first line treatment for this issue. ?Dietary changes: Increase soluble fiber. Decrease simple carbohydrates. ?Exercise changes: Moderate to vigorous-intensity aerobic activity 150 minutes per week if tolerated. ?Lipid-lowering medications: see documented in medical record. ? ?3. Obesity, current BMI 33.3 ?Cynthia [name] is currently in the action stage of change. As such, her goal is to continue with weight loss efforts. She has agreed to keeping a food journal and adhering to recommended goals of 1400-1500 calories and 90 plus grams of protein.  ? ?Cynthia [name] is currently taking Wegovy 2.4 mg.  ? ?- Semaglutide-Weight Management (WEGOVY) 2.4 MG/0.75ML SOAJ; Inject 2.4 mg into the skin once a week.  Dispense: 3 mL; Refill: 0 ? ?Exercise goals:  As is.  ? ?Behavioral modification strategies: increasing lean protein intake, increasing water intake, and no skipping meals. ? ?Cynthia [name] has agreed to follow-up with our clinic in 4 weeks. She was informed of the importance of frequent follow-up visits to maximize her success with intensive lifestyle modifications for her multiple health conditions.  ? ?Objective:  ? ?Blood pressure 128/87, pulse 96, temperature 97.9 ?F (36.6 ?C), height '5\' 2"'$  (1.575 m), weight 181 lb (82.1 kg), SpO2 100 %. ?Body mass index is 33.11 kg/m?. ? ?General: Cooperative, alert, well developed, in no acute distress. ?HEENT: Conjunctivae and lids unremarkable. ?Cardiovascular: Regular rhythm.  ?Lungs: Normal work of breathing. ?Neurologic: No focal deficits.  ? ?Lab Results  ?Component Value Date  ?  CREATININE 0.86 10/25/2021  ? BUN 16 10/25/2021  ? NA 140 10/25/2021  ? K 4.2 10/25/2021  ? CL 104 10/25/2021  ? CO2 23 10/25/2021  ? ?Lab Results  ?Component Value Date  ? ALT 18 10/25/2021  ?  AST 18 10/25/2021  ? ALKPHOS 64 10/25/2021  ? BILITOT 0.3 10/25/2021  ? ?Lab Results  ?Component Value Date  ? HGBA1C 5.7 (H) 10/25/2021  ? HGBA1C 6.1 (H) 07/20/2020  ? HGBA1C 4.5 (L) 03/18/2018  ? HGBA1C 5.5 01/16/2017  ? HGBA1C 5.9 12/28/2015  ? ?Lab Results  ?Component Value Date  ? INSULIN 13.8 10/25/2021  ? INSULIN 25.6 (H) 07/20/2020  ? ?Lab Results  ?Component Value Date  ? TSH 1.240 10/25/2021  ? ?Lab Results  ?Component Value Date  ? CHOL 219 (H) 10/25/2021  ? HDL 42 10/25/2021  ? [place] 150 (H) 10/25/2021  ? LDLDIRECT 110.0 12/28/2015  ? TRIG 148 10/25/2021  ? CHOLHDL 5 08/12/2019  ? ?Lab Results  ?Component Value Date  ? VD25OH 46.7 10/25/2021  ? VD25OH 16.3 (L) 07/20/2020  ? VD25OH 11.09 (L) 08/12/2019  ? ?Lab Results  ?Component Value Date  ? WBC 6.7 10/25/2021  ? HGB 12.6 10/25/2021  ? HCT 47.2 (H) 10/25/2021  ? MCV 85 10/25/2021  ? PLT 468 (H) 10/25/2021  ? ?Lab Results  ?Component Value Date  ? IRON 76 10/25/2021  ? TIBC 475 (H) 10/25/2021  ? FERRITIN 13 (L) 10/25/2021  ? ?Attestation Statements:  ? ?Reviewed by clinician on day of visit: allergies, medications, problem list, medical history, surgical history, family history, social history, and previous encounter notes. ? ?Time spent on visit including pre-visit chart review and post-visit care and charting was 30 minutes.  ? ?I, Lizbeth Bark, RMA, am acting as Location manager for Everardo Pacific, FNP.  ? ?I have reviewed the above documentation for accuracy and completeness, and I agree with the above. Everardo Pacific, FNP  ?

## 2022-04-25 ENCOUNTER — Ambulatory Visit (INDEPENDENT_AMBULATORY_CARE_PROVIDER_SITE_OTHER): Payer: 59 | Admitting: Clinical

## 2022-04-25 DIAGNOSIS — F4323 Adjustment disorder with mixed anxiety and depressed mood: Secondary | ICD-10-CM

## 2022-04-25 NOTE — Progress Notes (Signed)
Diagnosis: F43.23 ?Time: 10:00 am-11:00 am ?CPT: 56387F-64 ? ?Cynthia [name] was seen remotely using secure video conferencing. She was in her home and the therapist was in her office at the time of the appointment. She shared that her daughter had received a diagnosis of autism since her last session, and she had experienced recurring anxiety attacks which were being treated with prescriptions for buspirone and xanax. She shared that she does not have a support system, and reflected upon her relationship with her mother. Therapist suggested exploring and allowing the complexity of her own experience of her mother as a way of allowing herself grace for perceived failures as a parent. Therapist also encouraged her to continue exploring support groups. She is scheduled to be seen again in two weeks. ? ?Treatment Plan ?Client Abilities/Strengths  ?Cynthia [name] is seeking CBT to help her cope with her daughter's ASD diagnosis and pursue appropriate treatments. She shared that she is also interested in regaining motivation to achieve previous levels of discipline in her lifestyle.  ?Client Treatment Preferences  ?Cynthia [name] prefers remote appointments that occur while her children are in school, between 9:30am-12:00am  ?Client Statement of Needs  ?Cynthia [name] is seeking CBT to help her cope with and navigate her daughter's diagnosis, as well as establishing and maintaining a self-care routine  ?Treatment Level  ?Monthly  ?Symptoms  ?Anxiety: sleep irregularities, excessive worry (Status: maintained). Depression: frequent headaches, weight gain, fatigue, sleep difficulties (Status: maintained).  ?Problems Addressed  ?New Description, New Description  ?Goals ?1. Cynthia [name] has struggled to maintain self care in recent years ?Objective ?Cynthia [name] would like to increase her understanding of her daughter in order to better address her needs ?Target Date: 2022-08-16 Frequency: Monthly  ?Progress: 0 Modality: individual  ?Related Interventions ?Therapist  will incorporate manualized treatments including Parent Training for Disruptive Behaviors in ASD  ?Therapist will provide referrals for additional resources as appropriate  ?Objective ?Cynthia [name] would like to establish a consistent self care routine  ?Target Date: 2022-08-16 Frequency: Monthly  ?Progress: 0 Modality: individual  ?Related Interventions ?Therapist will incorporate CBT based strategies to help Cynthia [name] identify and disengage from maladaptive thoughts and behaviors ?Cynthia [name] will be provided opportunities to process experiences in session ?2. Cynthia [name]'s daughter was diagnosed with ASD in spring of 2022 ?Diagnosis ?Axis none 309.28 (Adjustment disorder with mixed anxiety and depressed mood) - Open - [Signifier: n/a]    ?Conditions For Discharge ?Achievement of treatment goals and objectives ? ?[name], PhD ? ? ? ? ? ? ? ? ? ? ? ? ? ? ?[name], PhD ?

## 2022-04-26 ENCOUNTER — Ambulatory Visit: Payer: 59

## 2022-04-26 ENCOUNTER — Other Ambulatory Visit (HOSPITAL_COMMUNITY): Payer: Self-pay

## 2022-04-26 ENCOUNTER — Ambulatory Visit (INDEPENDENT_AMBULATORY_CARE_PROVIDER_SITE_OTHER): Payer: 59 | Admitting: Family Medicine

## 2022-04-26 DIAGNOSIS — Z23 Encounter for immunization: Secondary | ICD-10-CM

## 2022-04-26 DIAGNOSIS — E669 Obesity, unspecified: Secondary | ICD-10-CM | POA: Diagnosis not present

## 2022-04-26 DIAGNOSIS — F418 Other specified anxiety disorders: Secondary | ICD-10-CM | POA: Diagnosis not present

## 2022-04-26 DIAGNOSIS — R632 Polyphagia: Secondary | ICD-10-CM | POA: Diagnosis not present

## 2022-04-26 DIAGNOSIS — F909 Attention-deficit hyperactivity disorder, unspecified type: Secondary | ICD-10-CM | POA: Diagnosis not present

## 2022-04-26 MED ORDER — WEGOVY 2.4 MG/0.75ML ~~LOC~~ SOAJ
2.4000 mg | SUBCUTANEOUS | 0 refills | Status: DC
  Filled 2022-04-26 – 2022-05-02 (×4): qty 9, 84d supply, fill #0

## 2022-04-26 NOTE — Progress Notes (Signed)
Patient was in office to get her second shingles shot in left deltoid

## 2022-04-27 ENCOUNTER — Other Ambulatory Visit (HOSPITAL_COMMUNITY): Payer: Self-pay

## 2022-04-27 MED ORDER — VYVANSE 20 MG PO CAPS
20.0000 mg | ORAL_CAPSULE | Freq: Every morning | ORAL | 0 refills | Status: DC
  Filled 2022-04-27: qty 90, 90d supply, fill #0

## 2022-04-30 ENCOUNTER — Ambulatory Visit: Payer: 59 | Admitting: Family Medicine

## 2022-04-30 ENCOUNTER — Encounter: Payer: Self-pay | Admitting: Family Medicine

## 2022-04-30 VITALS — BP 116/68 | HR 87 | Temp 98.1°F | Resp 16 | Ht 62.0 in | Wt 184.8 lb

## 2022-04-30 DIAGNOSIS — Z23 Encounter for immunization: Secondary | ICD-10-CM | POA: Diagnosis not present

## 2022-04-30 DIAGNOSIS — Z7185 Encounter for immunization safety counseling: Secondary | ICD-10-CM

## 2022-04-30 NOTE — Progress Notes (Signed)
   Subjective:    Patient ID: Cynthia [name], female    DOB: [birth year], 41 y.o.   MRN: [id number]  HPI HPV vaccine- pt's husband developed warts on his face after a bout w/ shingles.  Husband was told it was a good idea to get HPV vaccine.  Pt would also like to get HPV vaccines if I think it is necessary.   Review of Systems For ROS see HPI     Objective:   Physical Exam Vitals reviewed.  Constitutional:      General: She is not in acute distress.    Appearance: Normal appearance. She is not ill-appearing.  HENT:     Head: Normocephalic and atraumatic.  Skin:    General: Skin is warm and dry.     Findings: No lesion.  Neurological:     General: No focal deficit present.     Mental Status: She is alert and oriented to person, place, and time.  Psychiatric:        Mood and Affect: Mood normal.        Behavior: Behavior normal.        Thought Content: Thought content normal.          Assessment & Plan:  HPV counseling- new.  Since husband now has warts that were caused by HPV, it is reasonable that wife get the vaccine series to protect herself going forward.  First dose given today.  Pt tolerated w/o difficulty.

## 2022-04-30 NOTE — Patient Instructions (Signed)
Follow up as needed or as scheduled Schedule a nurse visit in 6-8 weeks for 2nd HPV vaccine Call with any questions or concerns Have a great summer!!!

## 2022-05-01 ENCOUNTER — Other Ambulatory Visit (HOSPITAL_COMMUNITY): Payer: Self-pay

## 2022-05-02 ENCOUNTER — Other Ambulatory Visit (HOSPITAL_COMMUNITY): Payer: Self-pay

## 2022-05-03 ENCOUNTER — Other Ambulatory Visit (HOSPITAL_COMMUNITY): Payer: Self-pay

## 2022-05-10 ENCOUNTER — Ambulatory Visit (INDEPENDENT_AMBULATORY_CARE_PROVIDER_SITE_OTHER): Payer: 59 | Admitting: Clinical

## 2022-05-10 DIAGNOSIS — F4323 Adjustment disorder with mixed anxiety and depressed mood: Secondary | ICD-10-CM

## 2022-05-10 NOTE — Progress Notes (Signed)
Diagnosis: F43.23 Time: 3:00 pm-4:00 pm CPT: 26378H-88  Cynthia [name] was seen remotely using secure video conferencing. She was in her home and the therapist was in her office at the time of the appointment. Session focused on processing her ongoing anxiety about her daughter's diagnoses and her husband's job. Therapist encouraged her toward shared her emotional needs with her husband, as well as considering alternate outcomes when she has "what-if" thoughts. She is scheduled to be seen again in two weeks.  Treatment Plan Client Abilities/Strengths  Cynthia [name] is seeking CBT to help her cope with her daughter's ASD diagnosis and pursue appropriate treatments. She shared that she is also interested in regaining motivation to achieve previous levels of discipline in her lifestyle.  Client Treatment Preferences  Cynthia [name] prefers remote appointments that occur while her children are in school, between 48:30am-12:00am  Client Statement of Needs  Cynthia [name] is seeking CBT to help her cope with and navigate her daughter's diagnosis, as well as establishing and maintaining a self-care routine  Treatment Level  Monthly  Symptoms  Anxiety: sleep irregularities, excessive worry (Status: maintained). Depression: frequent headaches, weight gain, fatigue, sleep difficulties (Status: maintained).  Problems Addressed  New Description, New Description  Goals 1. Cynthia [name] has struggled to maintain self care in recent years Objective Cynthia [name] would like to increase her understanding of her daughter in order to better address her needs Target Date: 2022-08-16 Frequency: Monthly  Progress: 0 Modality: individual  Related Interventions Therapist will incorporate manualized treatments including Parent Training for Disruptive Behaviors in ASD  Therapist will provide referrals for additional resources as appropriate  Objective Cynthia [name] would like to establish a consistent self care routine  Target Date: 2022-08-16 Frequency: Monthly   Progress: 0 Modality: individual  Related Interventions Therapist will incorporate CBT based strategies to help Cynthia [name] identify and disengage from maladaptive thoughts and behaviors Cynthia [name] will be provided opportunities to process experiences in session 2. Cynthia [name]'s daughter was diagnosed with ASD in spring of 2022 Diagnosis Axis none 309.28 (Adjustment disorder with mixed anxiety and depressed mood) - Open - [Signifier: n/a]    Conditions For Discharge Achievement of treatment goals and objectives  Cynthia Cruise, PhD               Cynthia Cruise, PhD               Cynthia Cruise, PhD

## 2022-05-22 ENCOUNTER — Other Ambulatory Visit (HOSPITAL_COMMUNITY): Payer: Self-pay

## 2022-06-06 ENCOUNTER — Ambulatory Visit (INDEPENDENT_AMBULATORY_CARE_PROVIDER_SITE_OTHER): Payer: 59 | Admitting: Clinical

## 2022-06-06 DIAGNOSIS — F4323 Adjustment disorder with mixed anxiety and depressed mood: Secondary | ICD-10-CM

## 2022-06-06 NOTE — Progress Notes (Signed)
Diagnosis: F43.23 Time: 10:00 am-11:00 am CPT: 49702O-37  Cynthia [name] was seen remotely using secure video conferencing. She was in her home and the therapist was in her office at the time of the appointment. She shared that both of her daughters had eloped in the past week. Session focused on strategies to ensure their safety, including padlocks, a tracking device they can wear, identification bracelets, and notifying her HOA. She is scheduled to be seen again in two weeks.  Treatment Plan Client Abilities/Strengths  Cynthia [name] is seeking CBT to help her cope with her daughter's ASD diagnosis and pursue appropriate treatments. She shared that she is also interested in regaining motivation to achieve previous levels of discipline in her lifestyle.  Client Treatment Preferences  Cynthia [name] prefers remote appointments that occur while her children are in school, between 64:30am-12:00am  Client Statement of Needs  Cynthia [name] is seeking CBT to help her cope with and navigate her daughter's diagnosis, as well as establishing and maintaining a self-care routine  Treatment Level  Monthly  Symptoms  Anxiety: sleep irregularities, excessive worry (Status: maintained). Depression: frequent headaches, weight gain, fatigue, sleep difficulties (Status: maintained).  Problems Addressed  New Description, New Description  Goals 1. Cynthia [name] has struggled to maintain self care in recent years Objective Cynthia [name] would like to increase her understanding of her daughter in order to better address her needs Target Date: 2022-08-16 Frequency: Monthly  Progress: 0 Modality: individual  Related Interventions Therapist will incorporate manualized treatments including Parent Training for Disruptive Behaviors in ASD  Therapist will provide referrals for additional resources as appropriate  Objective Cynthia [name] would like to establish a consistent self care routine  Target Date: 2022-08-16 Frequency: Monthly  Progress: 0 Modality:  individual  Related Interventions Therapist will incorporate CBT based strategies to help Cynthia [name] identify and disengage from maladaptive thoughts and behaviors Cynthia [name] will be provided opportunities to process experiences in session 2. Cynthia [name]'s daughter was diagnosed with ASD in spring of 2022 Diagnosis Axis none 309.28 (Adjustment disorder with mixed anxiety and depressed mood) - Open - [Signifier: n/a]    Conditions For Discharge Achievement of treatment goals and objectives        Cynthia Cruise, PhD               Cynthia Cruise, PhD

## 2022-06-07 ENCOUNTER — Other Ambulatory Visit (HOSPITAL_COMMUNITY): Payer: Self-pay

## 2022-06-07 DIAGNOSIS — F411 Generalized anxiety disorder: Secondary | ICD-10-CM | POA: Diagnosis not present

## 2022-06-07 DIAGNOSIS — R632 Polyphagia: Secondary | ICD-10-CM | POA: Diagnosis not present

## 2022-06-07 DIAGNOSIS — E669 Obesity, unspecified: Secondary | ICD-10-CM | POA: Diagnosis not present

## 2022-06-07 DIAGNOSIS — G43909 Migraine, unspecified, not intractable, without status migrainosus: Secondary | ICD-10-CM | POA: Diagnosis not present

## 2022-06-07 MED ORDER — PROPRANOLOL HCL 20 MG PO TABS
20.0000 mg | ORAL_TABLET | Freq: Three times a day (TID) | ORAL | 0 refills | Status: DC | PRN
  Filled 2022-06-07 – 2022-07-10 (×2): qty 60, 20d supply, fill #0

## 2022-06-08 ENCOUNTER — Ambulatory Visit: Payer: 59 | Admitting: Clinical

## 2022-06-11 ENCOUNTER — Other Ambulatory Visit (HOSPITAL_COMMUNITY): Payer: Self-pay

## 2022-06-11 MED ORDER — TOPIRAMATE ER 100 MG PO CAP24
100.0000 mg | ORAL_CAPSULE | Freq: Every day | ORAL | 0 refills | Status: DC
  Filled 2022-06-11 – 2022-07-10 (×2): qty 90, 90d supply, fill #0

## 2022-06-14 ENCOUNTER — Ambulatory Visit: Payer: 59

## 2022-06-18 ENCOUNTER — Ambulatory Visit (INDEPENDENT_AMBULATORY_CARE_PROVIDER_SITE_OTHER): Payer: 59 | Admitting: Clinical

## 2022-06-18 ENCOUNTER — Other Ambulatory Visit (HOSPITAL_COMMUNITY): Payer: Self-pay

## 2022-06-18 DIAGNOSIS — F4323 Adjustment disorder with mixed anxiety and depressed mood: Secondary | ICD-10-CM | POA: Diagnosis not present

## 2022-06-18 MED ORDER — WEGOVY 2.4 MG/0.75ML ~~LOC~~ SOAJ
2.4000 mg | SUBCUTANEOUS | 0 refills | Status: DC
  Filled 2022-06-18 – 2022-08-06 (×2): qty 9, 84d supply, fill #0

## 2022-06-18 NOTE — Progress Notes (Signed)
Diagnosis: F43.23 Time: 12:05 pm-1:00pm CPT: 92426S-34  Kiasia was seen remotely using secure video conferencing. She was in her home and the therapist was in her office at the time of the appointment. Session focused on dynamics with her mother in law. Therapist engaged her in a discussion of how to set and uphold boundaries. She is scheduled to be seen again in three weeks.  Treatment Plan Client Abilities/Strengths  Brynne is seeking CBT to help her cope with her daughter's ASD diagnosis and pursue appropriate treatments. She shared that she is also interested in regaining motivation to achieve previous levels of discipline in her lifestyle.  Client Treatment Preferences  Vilda prefers remote appointments that occur while her children are in school, between 67:30am-12:00am  Client Statement of Needs  Shalee is seeking CBT to help her cope with and navigate her daughter's diagnosis, as well as establishing and maintaining a self-care routine  Treatment Level  Monthly  Symptoms  Anxiety: sleep irregularities, excessive worry (Status: maintained). Depression: frequent headaches, weight gain, fatigue, sleep difficulties (Status: maintained).  Problems Addressed  New Description, New Description  Goals 1. Marla has struggled to maintain self care in recent years Objective Tylar would like to increase her understanding of her daughter in order to better address her needs Target Date: 2022-08-16 Frequency: Monthly  Progress: 0 Modality: individual  Related Interventions Therapist will incorporate manualized treatments including Parent Training for Disruptive Behaviors in ASD  Therapist will provide referrals for additional resources as appropriate  Objective Lakyra would like to establish a consistent self care routine  Target Date: 2022-08-16 Frequency: Monthly  Progress: 0 Modality: individual  Related Interventions Therapist will incorporate CBT based strategies to help Maureena  identify and disengage from maladaptive thoughts and behaviors Loukisha will be provided opportunities to process experiences in session 2. Serin's daughter was diagnosed with ASD in spring of 2022 Diagnosis Axis none 309.28 (Adjustment disorder with mixed anxiety and depressed mood) - Open - [Signifier: n/a]    Conditions For Discharge Achievement of treatment goals and objectives            [name], PhD               [name], PhD

## 2022-06-19 ENCOUNTER — Other Ambulatory Visit (HOSPITAL_COMMUNITY): Payer: Self-pay

## 2022-06-21 ENCOUNTER — Ambulatory Visit (INDEPENDENT_AMBULATORY_CARE_PROVIDER_SITE_OTHER): Payer: 59 | Admitting: Family Medicine

## 2022-06-21 DIAGNOSIS — Z23 Encounter for immunization: Secondary | ICD-10-CM

## 2022-06-21 DIAGNOSIS — Z7185 Encounter for immunization safety counseling: Secondary | ICD-10-CM

## 2022-06-21 NOTE — Progress Notes (Signed)
Pt here for 2nd shingles vaccine 1st given without issue

## 2022-07-09 ENCOUNTER — Ambulatory Visit: Payer: 59 | Admitting: Clinical

## 2022-07-10 ENCOUNTER — Other Ambulatory Visit: Payer: Self-pay

## 2022-07-10 ENCOUNTER — Other Ambulatory Visit (HOSPITAL_COMMUNITY): Payer: Self-pay

## 2022-07-10 ENCOUNTER — Other Ambulatory Visit (INDEPENDENT_AMBULATORY_CARE_PROVIDER_SITE_OTHER): Payer: Self-pay | Admitting: Family Medicine

## 2022-07-10 DIAGNOSIS — F32A Depression, unspecified: Secondary | ICD-10-CM

## 2022-07-10 MED ORDER — ESCITALOPRAM OXALATE 20 MG PO TABS
20.0000 mg | ORAL_TABLET | Freq: Every day | ORAL | 1 refills | Status: DC
  Filled 2022-07-10: qty 90, 90d supply, fill #0
  Filled 2022-11-19: qty 90, 90d supply, fill #1

## 2022-07-16 ENCOUNTER — Other Ambulatory Visit (HOSPITAL_COMMUNITY): Payer: Self-pay

## 2022-07-18 ENCOUNTER — Encounter (INDEPENDENT_AMBULATORY_CARE_PROVIDER_SITE_OTHER): Payer: Self-pay

## 2022-08-02 DIAGNOSIS — G43909 Migraine, unspecified, not intractable, without status migrainosus: Secondary | ICD-10-CM | POA: Diagnosis not present

## 2022-08-02 DIAGNOSIS — R632 Polyphagia: Secondary | ICD-10-CM | POA: Diagnosis not present

## 2022-08-02 DIAGNOSIS — E669 Obesity, unspecified: Secondary | ICD-10-CM | POA: Diagnosis not present

## 2022-08-02 DIAGNOSIS — Z6833 Body mass index (BMI) 33.0-33.9, adult: Secondary | ICD-10-CM | POA: Diagnosis not present

## 2022-08-02 DIAGNOSIS — F909 Attention-deficit hyperactivity disorder, unspecified type: Secondary | ICD-10-CM | POA: Diagnosis not present

## 2022-08-06 ENCOUNTER — Other Ambulatory Visit (HOSPITAL_COMMUNITY): Payer: Self-pay

## 2022-08-07 ENCOUNTER — Other Ambulatory Visit (HOSPITAL_COMMUNITY): Payer: Self-pay

## 2022-08-08 ENCOUNTER — Ambulatory Visit (INDEPENDENT_AMBULATORY_CARE_PROVIDER_SITE_OTHER): Payer: 59 | Admitting: Clinical

## 2022-08-08 DIAGNOSIS — F4323 Adjustment disorder with mixed anxiety and depressed mood: Secondary | ICD-10-CM | POA: Diagnosis not present

## 2022-08-08 NOTE — Progress Notes (Signed)
Diagnosis: F43.23 Time: 1:02 pm-2:00pm CPT: 48250I-37  Margorie was seen remotely using secure video conferencing. She was in her home and the therapist was in her office at the time of the appointment. Session focused on her ongoing "untethered" feeling related to her children's diagnoses. Therapist provided an opportunity to process, suggesting that fears for their future may be related to grief she is feeling at the loss of hopes she has had for them. Therapist suggested allowing this feeling while also remaining aware that she does not actually know what the future holds. She is scheduled to be seen again in one month.  Treatment Plan Client Abilities/Strengths  Donette is seeking CBT to help her cope with her daughter's ASD diagnosis and pursue appropriate treatments. She shared that she is also interested in regaining motivation to achieve previous levels of discipline in her lifestyle.  Client Treatment Preferences  Lummie prefers remote appointments that occur while her children are in school, between 72:30am-12:00am  Client Statement of Needs  Idamae is seeking CBT to help her cope with and navigate her daughter's diagnosis, as well as establishing and maintaining a self-care routine  Treatment Level  Monthly  Symptoms  Anxiety: sleep irregularities, excessive worry (Status: maintained). Depression: frequent headaches, weight gain, fatigue, sleep difficulties (Status: maintained).  Problems Addressed  New Description, New Description  Goals 1. Kelle has struggled to maintain self care in recent years Objective Haileyann would like to increase her understanding of her daughter in order to better address her needs Target Date: 2022-08-16 Frequency: Monthly  Progress: 0 Modality: individual  Related Interventions Therapist will incorporate manualized treatments including Parent Training for Disruptive Behaviors in ASD  Therapist will provide referrals for additional resources as  appropriate  Objective Akeya would like to establish a consistent self care routine  Target Date: 2022-08-16 Frequency: Monthly  Progress: 0 Modality: individual  Related Interventions Therapist will incorporate CBT based strategies to help Beatrice identify and disengage from maladaptive thoughts and behaviors [name] will be provided opportunities to process experiences in session 2. Coleen's daughter was diagnosed with ASD in spring of 2022 Diagnosis Axis none 309.28 (Adjustment disorder with mixed anxiety and depressed mood) - Open - [Signifier: n/a]    Conditions For Discharge Achievement of treatment goals and objectives        [name], PhD               [name], PhD

## 2022-08-28 ENCOUNTER — Other Ambulatory Visit (HOSPITAL_COMMUNITY): Payer: Self-pay

## 2022-08-29 ENCOUNTER — Other Ambulatory Visit (HOSPITAL_COMMUNITY): Payer: Self-pay

## 2022-08-29 MED ORDER — LISDEXAMFETAMINE DIMESYLATE 20 MG PO CAPS
20.0000 mg | ORAL_CAPSULE | Freq: Every morning | ORAL | 0 refills | Status: DC
  Filled 2022-08-29: qty 90, 90d supply, fill #0

## 2022-09-06 ENCOUNTER — Other Ambulatory Visit (HOSPITAL_COMMUNITY): Payer: Self-pay

## 2022-09-06 DIAGNOSIS — Z6833 Body mass index (BMI) 33.0-33.9, adult: Secondary | ICD-10-CM | POA: Diagnosis not present

## 2022-09-06 DIAGNOSIS — E6609 Other obesity due to excess calories: Secondary | ICD-10-CM | POA: Diagnosis not present

## 2022-09-06 DIAGNOSIS — I10 Essential (primary) hypertension: Secondary | ICD-10-CM | POA: Diagnosis not present

## 2022-09-06 DIAGNOSIS — R632 Polyphagia: Secondary | ICD-10-CM | POA: Diagnosis not present

## 2022-09-06 MED ORDER — SPIRONOLACTONE 50 MG PO TABS
50.0000 mg | ORAL_TABLET | Freq: Every day | ORAL | 0 refills | Status: DC
  Filled 2022-09-06: qty 30, 30d supply, fill #0

## 2022-09-13 DIAGNOSIS — H1045 Other chronic allergic conjunctivitis: Secondary | ICD-10-CM | POA: Diagnosis not present

## 2022-09-14 ENCOUNTER — Ambulatory Visit (INDEPENDENT_AMBULATORY_CARE_PROVIDER_SITE_OTHER): Payer: 59 | Admitting: Clinical

## 2022-09-14 DIAGNOSIS — F4323 Adjustment disorder with mixed anxiety and depressed mood: Secondary | ICD-10-CM | POA: Diagnosis not present

## 2022-09-14 NOTE — Progress Notes (Signed)
Diagnosis: F43.23 Time: 10:02 am-11:00am CPT: 40981X-91  Cynthia [name] was seen remotely using secure video conferencing. She was in a parked car outside of the [employer] coffee shop in [place], and the therapist was in her office at the time of the appointment. Session focused on recent stress related health challenges (visual hallucinations of dark shadows that she believes to be due to a medical issue with her eye), as well as having discovered that her daughter not being accepted to a particular private school had been leaked to community members. Therapist provided an opportunity to process these experiences, offering validation and support and encouraging Jimesha to consider next steps. Therapist also engaged her in a review and update of her treatment plan. She was actively involved in determining goals and interventions for treatment, and provided verbal consent. She is scheduled to be seen again in two weeks.   Treatment Plan Client Abilities/Strengths  Mariza is seeking CBT to help her cope with her daughter's ASD diagnosis and pursue appropriate treatments. She shared that she is also interested in regaining motivation to achieve previous levels of discipline in her lifestyle.  Client Treatment Preferences  Tahjanae prefers remote appointments that occur while her children are in school, between 66:30am-12:00am  Client Statement of Needs  Adisa is seeking CBT to help her cope with and navigate her daughter's diagnosis, as well as establishing and maintaining a self-care routine  Treatment Level  Monthly  Symptoms  Anxiety: sleep irregularities, excessive worry (Status: maintained). Depression: frequent headaches, weight gain, fatigue, sleep difficulties (Status: maintained).  Problems Addressed  New Description, New Description  Goals 1. Zriyah has struggled to maintain self care in recent years Objective Shericka would like to increase her understanding of her daughter in order to  better address her needs Target Date: 2023-08-17 Frequency: Monthly  Progress: 40 Modality: individual  Related Interventions Therapist will incorporate manualized treatments including Parent Training for Disruptive Behaviors in ASD  Therapist will provide referrals for additional resources as appropriate  Objective Tylan would like to establish a consistent self care routine  Target Date: 2023-08-17 Frequency: Monthly  Progress: 25 Modality: individual  Related Interventions Therapist will incorporate CBT based strategies to help Hollis identify and disengage from maladaptive thoughts and behaviors Gina will be provided opportunities to process experiences in session 2. Adreanna would like to increase self-accountability toward accomplishing set tasks Target date: 09/15/2023 Diagnosis Axis none 309.28 (Adjustment disorder with mixed anxiety and depressed mood) - Open - [Signifier: n/a]    Conditions For Discharge Achievement of treatment goals and objectives        [name], PhD               [name], PhD               [name], PhD

## 2022-09-24 ENCOUNTER — Other Ambulatory Visit (HOSPITAL_COMMUNITY): Payer: Self-pay

## 2022-09-26 ENCOUNTER — Ambulatory Visit (INDEPENDENT_AMBULATORY_CARE_PROVIDER_SITE_OTHER): Payer: 59 | Admitting: Clinical

## 2022-09-26 DIAGNOSIS — F4323 Adjustment disorder with mixed anxiety and depressed mood: Secondary | ICD-10-CM | POA: Diagnosis not present

## 2022-09-26 NOTE — Progress Notes (Signed)
Diagnosis: F43.23 Time: 11:07 am-12:00 pm CPT: [id number]-70  Cynthia [name] was seen remotely using secure video conferencing. She was in her home and the therapist was in her office at the time of the appointment. Beautifull queried into research on stem cell therapy for children with ASD, and therapist provided data from recent research, as well as the contact information to sign her children up for the research registry operated through [employer]. Cynthia [name] also queried into research regarding how to address gestalt language in ABA therapy, and therapist suggested the SCERTS method and the book More Than Words by Corlis Hove. She is scheduled to be seen again in two weeks.  Treatment Plan Client Abilities/Strengths  Cynthia [name] is seeking CBT to help her cope with her daughter's ASD diagnosis and pursue appropriate treatments. She shared that she is also interested in regaining motivation to achieve previous levels of discipline in her lifestyle.  Client Treatment Preferences  Cynthia [name] prefers remote appointments that occur while her children are in school, between 72:30am-12:00am  Client Statement of Needs  Cynthia [name] is seeking CBT to help her cope with and navigate her daughter's diagnosis, as well as establishing and maintaining a self-care routine  Treatment Level  Monthly  Symptoms  Anxiety: sleep irregularities, excessive worry (Status: maintained). Depression: frequent headaches, weight gain, fatigue, sleep difficulties (Status: maintained).  Problems Addressed  New Description, New Description  Goals 1. Cynthia [name] has struggled to maintain self care in recent years Objective Cynthia [name] would like to increase her understanding of her daughter in order to better address her needs Target Date: 2023-08-17 Frequency: Monthly  Progress: 40 Modality: individual  Related Interventions Therapist will incorporate manualized treatments including Parent Training for Disruptive Behaviors in ASD  Therapist will  provide referrals for additional resources as appropriate  Objective Cynthia [name] would like to establish a consistent self care routine  Target Date: 2023-08-17 Frequency: Monthly  Progress: 25 Modality: individual  Related Interventions Therapist will incorporate CBT based strategies to help Cynthia [name] identify and disengage from maladaptive thoughts and behaviors Cynthia [name] will be provided opportunities to process experiences in session 2. Cynthia [name] would like to increase self-accountability toward accomplishing set tasks Target date: 09/15/2023 Diagnosis Axis none 309.28 (Adjustment disorder with mixed anxiety and depressed mood) - Open - [Signifier: n/a]    Conditions For Discharge Achievement of treatment goals and objectives        [name], PhD               [name], PhD               [name], PhD               [name], PhD

## 2022-09-27 ENCOUNTER — Other Ambulatory Visit (HOSPITAL_COMMUNITY): Payer: Self-pay

## 2022-10-04 ENCOUNTER — Other Ambulatory Visit (HOSPITAL_COMMUNITY): Payer: Self-pay

## 2022-10-10 ENCOUNTER — Ambulatory Visit (INDEPENDENT_AMBULATORY_CARE_PROVIDER_SITE_OTHER): Payer: 59 | Admitting: Clinical

## 2022-10-10 DIAGNOSIS — F4323 Adjustment disorder with mixed anxiety and depressed mood: Secondary | ICD-10-CM | POA: Diagnosis not present

## 2022-10-10 NOTE — Progress Notes (Signed)
Diagnosis: F43.23 Time: 11:01 am-12:00 pm CPT: 28315V-76  Cynthia [name] was seen remotely using secure video conferencing. She was in her home and the therapist was in her office at the time of the appointment. She shared that she has continued to struggle emotionally with concerns for her daughters, as well as emotions related to her relationship with her own mother and mother in law. Therapist provided an opportunity to process, providing validation and support, and suggesting communication strategies. She is scheduled to be seen again in four weeks.  Treatment Plan Client Abilities/Strengths  Cynthia [name] is seeking CBT to help her cope with her daughter's ASD diagnosis and pursue appropriate treatments. She shared that she is also interested in regaining motivation to achieve previous levels of discipline in her lifestyle.  Client Treatment Preferences  Cynthia [name] prefers remote appointments that occur while her children are in school, between 35:30am-12:00am  Client Statement of Needs  Cynthia [name] is seeking CBT to help her cope with and navigate her daughter's diagnosis, as well as establishing and maintaining a self-care routine  Treatment Level  Monthly  Symptoms  Anxiety: sleep irregularities, excessive worry (Status: maintained). Depression: frequent headaches, weight gain, fatigue, sleep difficulties (Status: maintained).  Problems Addressed  New Description, New Description  Goals 1. Cynthia [name] has struggled to maintain self care in recent years Objective Cynthia [name] would like to increase her understanding of her daughter in order to better address her needs Target Date: 2023-08-17 Frequency: Monthly  Progress: 40 Modality: individual  Related Interventions Therapist will incorporate manualized treatments including Parent Training for Disruptive Behaviors in ASD  Therapist will provide referrals for additional resources as appropriate  Objective Cynthia [name] would like to establish a consistent self care routine   Target Date: 2023-08-17 Frequency: Monthly  Progress: 25 Modality: individual  Related Interventions Therapist will incorporate CBT based strategies to help Cynthia [name] identify and disengage from maladaptive thoughts and behaviors Cynthia [name] will be provided opportunities to process experiences in session 2. Cynthia [name] would like to increase self-accountability toward accomplishing set tasks Target date: 09/15/2023 Diagnosis Axis none 309.28 (Adjustment disorder with mixed anxiety and depressed mood) - Open - [Signifier: n/a]    Conditions For Discharge Achievement of treatment goals and objectives        [name], PhD               [name], PhD                   [name], PhD               [name], PhD

## 2022-10-16 ENCOUNTER — Other Ambulatory Visit (HOSPITAL_COMMUNITY): Payer: Self-pay

## 2022-10-16 DIAGNOSIS — I10 Essential (primary) hypertension: Secondary | ICD-10-CM | POA: Diagnosis not present

## 2022-10-16 DIAGNOSIS — R632 Polyphagia: Secondary | ICD-10-CM | POA: Diagnosis not present

## 2022-10-16 DIAGNOSIS — G43909 Migraine, unspecified, not intractable, without status migrainosus: Secondary | ICD-10-CM | POA: Diagnosis not present

## 2022-10-16 DIAGNOSIS — E669 Obesity, unspecified: Secondary | ICD-10-CM | POA: Diagnosis not present

## 2022-10-16 DIAGNOSIS — Z6832 Body mass index (BMI) 32.0-32.9, adult: Secondary | ICD-10-CM | POA: Diagnosis not present

## 2022-10-16 DIAGNOSIS — E559 Vitamin D deficiency, unspecified: Secondary | ICD-10-CM | POA: Diagnosis not present

## 2022-10-16 DIAGNOSIS — F908 Attention-deficit hyperactivity disorder, other type: Secondary | ICD-10-CM | POA: Diagnosis not present

## 2022-10-16 MED ORDER — WEGOVY 2.4 MG/0.75ML ~~LOC~~ SOAJ
2.4000 mg | SUBCUTANEOUS | 0 refills | Status: DC
  Filled 2022-10-16: qty 3, 28d supply, fill #0

## 2022-10-16 MED ORDER — SPIRONOLACTONE 50 MG PO TABS
50.0000 mg | ORAL_TABLET | Freq: Every day | ORAL | 0 refills | Status: DC
  Filled 2022-10-16: qty 30, 30d supply, fill #0

## 2022-10-16 MED ORDER — ERGOCALCIFEROL 1.25 MG (50000 UT) PO CAPS
50000.0000 [IU] | ORAL_CAPSULE | ORAL | 0 refills | Status: DC
  Filled 2022-10-16: qty 4, 28d supply, fill #0

## 2022-10-16 MED ORDER — TOPIRAMATE ER 100 MG PO CAP24
100.0000 mg | ORAL_CAPSULE | Freq: Every day | ORAL | 0 refills | Status: DC
  Filled 2022-10-16: qty 30, 30d supply, fill #0

## 2022-10-19 ENCOUNTER — Other Ambulatory Visit (HOSPITAL_COMMUNITY): Payer: Self-pay

## 2022-10-23 ENCOUNTER — Other Ambulatory Visit (HOSPITAL_COMMUNITY): Payer: Self-pay

## 2022-10-23 MED ORDER — LISDEXAMFETAMINE DIMESYLATE 40 MG PO CAPS
40.0000 mg | ORAL_CAPSULE | Freq: Every morning | ORAL | 0 refills | Status: DC
  Filled 2022-10-23 – 2022-11-16 (×3): qty 30, 30d supply, fill #0

## 2022-11-06 ENCOUNTER — Other Ambulatory Visit (HOSPITAL_COMMUNITY): Payer: Self-pay

## 2022-11-06 ENCOUNTER — Ambulatory Visit (INDEPENDENT_AMBULATORY_CARE_PROVIDER_SITE_OTHER): Payer: 59 | Admitting: Family Medicine

## 2022-11-06 ENCOUNTER — Encounter: Payer: Self-pay | Admitting: Family Medicine

## 2022-11-06 VITALS — BP 122/80 | HR 83 | Temp 97.6°F | Resp 17 | Ht 62.0 in | Wt 183.5 lb

## 2022-11-06 DIAGNOSIS — E669 Obesity, unspecified: Secondary | ICD-10-CM | POA: Diagnosis not present

## 2022-11-06 DIAGNOSIS — Z23 Encounter for immunization: Secondary | ICD-10-CM

## 2022-11-06 DIAGNOSIS — E559 Vitamin D deficiency, unspecified: Secondary | ICD-10-CM

## 2022-11-06 DIAGNOSIS — Z0000 Encounter for general adult medical examination without abnormal findings: Secondary | ICD-10-CM | POA: Diagnosis not present

## 2022-11-06 LAB — HEPATIC FUNCTION PANEL
ALT: 12 U/L (ref 0–35)
AST: 17 U/L (ref 0–37)
Albumin: 4 g/dL (ref 3.5–5.2)
Alkaline Phosphatase: 52 U/L (ref 39–117)
Bilirubin, Direct: 0.1 mg/dL (ref 0.0–0.3)
Total Bilirubin: 0.3 mg/dL (ref 0.2–1.2)
Total Protein: 7.2 g/dL (ref 6.0–8.3)

## 2022-11-06 LAB — LIPID PANEL
Cholesterol: 206 mg/dL — ABNORMAL HIGH (ref 0–200)
HDL: 46.1 mg/dL (ref >39.00)
LDL Cholesterol: 144 mg/dL — ABNORMAL HIGH (ref 0–99)
NonHDL: 159.73
Total CHOL/HDL Ratio: 4
Triglycerides: 81 mg/dL (ref 0.0–149.0)
VLDL: 16.2 mg/dL (ref 0.0–40.0)

## 2022-11-06 LAB — CBC WITH DIFFERENTIAL/PLATELET
Basophils Absolute: 0 10*3/uL (ref 0.0–0.1)
Basophils Relative: 0.5 % (ref 0.0–3.0)
Eosinophils Absolute: 0.2 10*3/uL (ref 0.0–0.7)
Eosinophils Relative: 2.9 % (ref 0.0–5.0)
HCT: 34.5 % — ABNORMAL LOW (ref 36.0–46.0)
Hemoglobin: 11.1 g/dL — ABNORMAL LOW (ref 12.0–15.0)
Lymphocytes Relative: 26 % (ref 12.0–46.0)
Lymphs Abs: 1.9 10*3/uL (ref 0.7–4.0)
MCHC: 32.1 g/dL (ref 30.0–36.0)
MCV: 77 fl — ABNORMAL LOW (ref 78.0–100.0)
Monocytes Absolute: 0.4 10*3/uL (ref 0.1–1.0)
Monocytes Relative: 4.9 % (ref 3.0–12.0)
Neutro Abs: 4.9 10*3/uL (ref 1.4–7.7)
Neutrophils Relative %: 65.7 % (ref 43.0–77.0)
Platelets: 440 10*3/uL — ABNORMAL HIGH (ref 150.0–400.0)
RBC: 4.47 Mil/uL (ref 3.87–5.11)
RDW: 15.7 % — ABNORMAL HIGH (ref 11.5–15.5)
WBC: 7.4 10*3/uL (ref 4.0–10.5)

## 2022-11-06 LAB — HEMOGLOBIN A1C: Hgb A1c MFr Bld: 5.8 % (ref 4.6–6.5)

## 2022-11-06 LAB — TSH: TSH: 1.84 u[IU]/mL (ref 0.35–5.50)

## 2022-11-06 LAB — BASIC METABOLIC PANEL
BUN: 20 mg/dL (ref 6–23)
CO2: 23 mEq/L (ref 19–32)
Calcium: 8.9 mg/dL (ref 8.4–10.5)
Chloride: 108 mEq/L (ref 96–112)
Creatinine, Ser: 0.78 mg/dL (ref 0.40–1.20)
GFR: 93.86 mL/min (ref >60.00)
Glucose, Bld: 78 mg/dL (ref 70–99)
Potassium: 4 mEq/L (ref 3.5–5.1)
Sodium: 139 mEq/L (ref 135–145)

## 2022-11-06 LAB — VITAMIN D 25 HYDROXY (VIT D DEFICIENCY, FRACTURES): VITD: 25.55 ng/mL — ABNORMAL LOW (ref 30.00–100.00)

## 2022-11-06 MED ORDER — NORETHINDRONE ACET-ETHINYL EST 1-20 MG-MCG PO TABS
1.0000 | ORAL_TABLET | Freq: Every day | ORAL | 11 refills | Status: DC
Start: 2022-11-06 — End: 2023-12-02
  Filled 2022-11-06 – 2022-11-16 (×2): qty 21, 21d supply, fill #0
  Filled 2022-12-11 (×2): qty 84, 84d supply, fill #1
  Filled 2023-03-13 – 2023-03-28 (×2): qty 84, 84d supply, fill #2
  Filled 2023-06-23: qty 84, 84d supply, fill #3
  Filled 2023-09-25: qty 63, 63d supply, fill #4

## 2022-11-06 NOTE — Progress Notes (Unsigned)
   Subjective:    Patient ID: Cynthia [name], female    DOB: 03-06-1981, 41 y.o.   MRN: [id number]  HPI CPE- due for pap, will get flu shot today  Patient Care Team    Relationship Specialty Notifications Start End  Midge Minium, MD PCP - General Family Medicine  06/13/17    Comment: Charlett Blake, MD  Family Medicine  06/21/16    Comment: Gemma Payor, MD Consulting Physician Obstetrics and Gynecology  09/28/20     Health Maintenance  Topic Date Due   PAP SMEAR-Modifier  01/17/2020   INFLUENZA VACCINE  07/10/2022   HPV VACCINES (3 - 3-dose SCDM series) 10/31/2022   HIV Screening  Completed   COVID-19 Vaccine  Discontinued   Hepatitis C Screening  Discontinued      Review of Systems Patient reports no vision/ hearing changes, adenopathy,fever, weight change,  persistant/recurrent hoarseness , swallowing issues, chest pain, palpitations, edema, persistant/recurrent cough, hemoptysis, dyspnea (rest/exertional/paroxysmal nocturnal), gastrointestinal bleeding (melena, rectal bleeding), abdominal pain, significant heartburn, bowel changes, GU symptoms (dysuria, hematuria, incontinence), Gyn symptoms (abnormal  bleeding, pain),  syncope, focal weakness, memory loss, numbness & tingling, skin/hair/nail changes, abnormal bruising or bleeding, anxiety, or depression.     Objective:   Physical Exam General Appearance:    Alert, cooperative, no distress, appears stated age  Head:    Normocephalic, without obvious abnormality, atraumatic  Eyes:    PERRL, conjunctiva/corneas clear, EOM's intact both eyes  Ears:    Normal TM's and external ear canals, both ears  Nose:   Nares normal, septum midline, mucosa normal, no drainage    or sinus tenderness  Throat:   Lips, mucosa, and tongue normal; teeth and gums normal  Neck:   Supple, symmetrical, trachea midline, no adenopathy;    Thyroid: no enlargement/tenderness/nodules  Back:     Symmetric, no curvature,  ROM normal, no CVA tenderness  Lungs:     Clear to auscultation bilaterally, respirations unlabored  Chest Wall:    No tenderness or deformity   Heart:    Regular rate and rhythm, S1 and S2 normal, no murmur, rub   or gallop  Breast Exam:    Deferred to GYN  Abdomen:     Soft, non-tender, bowel sounds active all four quadrants,    no masses, no organomegaly  Genitalia:    Deferred to GYN  Rectal:    Extremities:   Extremities normal, atraumatic, no cyanosis or edema  Pulses:   2+ and symmetric all extremities  Skin:   Skin color, texture, turgor normal, no rashes or lesions  Lymph nodes:   Cervical, supraclavicular, and axillary nodes normal  Neurologic:   CNII-XII intact, normal strength, sensation and reflexes    throughout          Assessment & Plan:

## 2022-11-06 NOTE — Patient Instructions (Signed)
Follow up in 1 year or as needed We'll notify you of your lab results and make any changes if needed Keep up the good work on healthy diet and regular exercise- you look great! Call with any questions or concerns Stay Safe!  Stay Healthy! Happy Holidays!!!

## 2022-11-07 ENCOUNTER — Other Ambulatory Visit (HOSPITAL_COMMUNITY): Payer: Self-pay

## 2022-11-07 ENCOUNTER — Telehealth: Payer: Self-pay

## 2022-11-07 ENCOUNTER — Ambulatory Visit (INDEPENDENT_AMBULATORY_CARE_PROVIDER_SITE_OTHER): Payer: 59 | Admitting: Clinical

## 2022-11-07 ENCOUNTER — Other Ambulatory Visit: Payer: Self-pay

## 2022-11-07 DIAGNOSIS — F4323 Adjustment disorder with mixed anxiety and depressed mood: Secondary | ICD-10-CM

## 2022-11-07 MED ORDER — VITAMIN D (ERGOCALCIFEROL) 1.25 MG (50000 UNIT) PO CAPS
50000.0000 [IU] | ORAL_CAPSULE | ORAL | 12 refills | Status: DC
  Filled 2022-11-07 – 2022-11-16 (×2): qty 7, 49d supply, fill #0
  Filled 2023-01-21: qty 7, 49d supply, fill #1

## 2022-11-07 NOTE — Progress Notes (Signed)
Diagnosis: F43.23 Time: 10:01 am-10:58 am CPT: 85462V-03  Apple was seen remotely using secure video conferencing. She was in her home and the therapist was in her office at the time of the appointment. Session focused on processing dynamics in the relationship with her mother following a recent visit. She is scheduled to be seen again in two weeks.  Treatment Plan Client Abilities/Strengths  Iliza is seeking CBT to help her cope with her daughter's ASD diagnosis and pursue appropriate treatments. She shared that she is also interested in regaining motivation to achieve previous levels of discipline in her lifestyle.  Client Treatment Preferences  Kosha prefers remote appointments that occur while her children are in school, between 72:30am-12:00am  Client Statement of Needs  Rasa is seeking CBT to help her cope with and navigate her daughter's diagnosis, as well as establishing and maintaining a self-care routine  Treatment Level  Monthly  Symptoms  Anxiety: sleep irregularities, excessive worry (Status: maintained). Depression: frequent headaches, weight gain, fatigue, sleep difficulties (Status: maintained).  Problems Addressed  New Description, New Description  Goals 1. Deriana has struggled to maintain self care in recent years Objective Cleopha would like to increase her understanding of her daughter in order to better address her needs Target Date: 2023-08-17 Frequency: Monthly  Progress: 40 Modality: individual  Related Interventions Therapist will incorporate manualized treatments including Parent Training for Disruptive Behaviors in ASD  Therapist will provide referrals for additional resources as appropriate  Objective Kye would like to establish a consistent self care routine  Target Date: 2023-08-17 Frequency: Monthly  Progress: 25 Modality: individual  Related Interventions Therapist will incorporate CBT based strategies to help Tihanna identify and disengage from  maladaptive thoughts and behaviors Brigid will be provided opportunities to process experiences in session 2. Takira would like to increase self-accountability toward accomplishing set tasks Target date: 09/15/2023 Diagnosis Axis none 309.28 (Adjustment disorder with mixed anxiety and depressed mood) - Open - [Signifier: n/a]    Conditions For Discharge Achievement of treatment goals and objectives        [name], PhD               [name], PhD                   [name], PhD               [name], PhD               [name], PhD

## 2022-11-07 NOTE — Assessment & Plan Note (Signed)
Check labs and replete prn. 

## 2022-11-07 NOTE — Telephone Encounter (Signed)
-----   Message from Midge Minium, MD sent at 11/07/2022  7:22 AM EST ----- Vit D is a little low.  Based on this, we need to start 50,000 units weekly x12 weeks in addition to daily OTC supplement of at least 2000 units.   Total cholesterol and LDL (bad cholesterol) are mildly elevated.  This will improve w/ healthy diet and regular exercise.  No medication needed at this time  Hemoglobin (blood count) is mildly low but this is not unusual in menstruating women.  Just make sure you are taking a daily multivitamin w/ iron.  Remainder of labs look great!

## 2022-11-07 NOTE — Telephone Encounter (Signed)
Informed pt of her lab results . Vitamin D has been sent to her pharmacy

## 2022-11-07 NOTE — Assessment & Plan Note (Signed)
Pt's PE WNL w/ exception of BMI.  Plans to schedule pap w/ GYN.  Tdap UTD.  Flu shot given today.  Check labs.  Anticipatory guidance provided.

## 2022-11-07 NOTE — Assessment & Plan Note (Signed)
Ongoing issue.  Following w/ [facility].  Current BMI 33.56.  Check labs to risk stratify.  Will follow.

## 2022-11-07 NOTE — Telephone Encounter (Signed)
Left pt a vm to call office in regard to lab results . Vitamin D has been sent to the pharmacy

## 2022-11-07 NOTE — Telephone Encounter (Signed)
Pt was returning a phone call from [facility].

## 2022-11-16 ENCOUNTER — Other Ambulatory Visit (HOSPITAL_COMMUNITY): Payer: Self-pay

## 2022-11-19 ENCOUNTER — Ambulatory Visit (INDEPENDENT_AMBULATORY_CARE_PROVIDER_SITE_OTHER): Payer: 59 | Admitting: Clinical

## 2022-11-19 ENCOUNTER — Other Ambulatory Visit (HOSPITAL_COMMUNITY): Payer: Self-pay

## 2022-11-19 DIAGNOSIS — F4323 Adjustment disorder with mixed anxiety and depressed mood: Secondary | ICD-10-CM

## 2022-11-19 NOTE — Progress Notes (Signed)
Diagnosis: F43.23 Time: 10:01 am-10:58 am CPT: 27614J-09  Cynthia [name] was seen remotely using secure video conferencing. She was in her home and the therapist was in her office at the time of the appointment. Session focused on processing emotions that had arisen following her visit with her mother. Therapist provided  validation and support, and encouraged Cynthia [name] to consider outlets for social connection. She is scheduled to be seen again in one month.  Treatment Plan Client Abilities/Strengths  Cynthia [name] is seeking CBT to help her cope with her daughter's ASD diagnosis and pursue appropriate treatments. She shared that she is also interested in regaining motivation to achieve previous levels of discipline in her lifestyle.  Client Treatment Preferences  Cynthia [name] prefers remote appointments that occur while her children are in school, between 2:30am-12:00am  Client Statement of Needs  Cynthia [name] is seeking CBT to help her cope with and navigate her daughter's diagnosis, as well as establishing and maintaining a self-care routine  Treatment Level  Monthly  Symptoms  Anxiety: sleep irregularities, excessive worry (Status: maintained). Depression: frequent headaches, weight gain, fatigue, sleep difficulties (Status: maintained).  Problems Addressed  New Description, New Description  Goals 1. Cynthia [name] has struggled to maintain self care in recent years Objective Cynthia [name] would like to increase her understanding of her daughter in order to better address her needs Target Date: 2023-08-17 Frequency: Monthly  Progress: 40 Modality: individual  Related Interventions Therapist will incorporate manualized treatments including Parent Training for Disruptive Behaviors in ASD  Therapist will provide referrals for additional resources as appropriate  Objective Cynthia [name] would like to establish a consistent self care routine  Target Date: 2023-08-17 Frequency: Monthly  Progress: 25 Modality: individual  Related  Interventions Therapist will incorporate CBT based strategies to help Cynthia [name] identify and disengage from maladaptive thoughts and behaviors Cynthia [name] will be provided opportunities to process experiences in session 2. Cynthia [name] would like to increase self-accountability toward accomplishing set tasks Target date: 09/15/2023 Diagnosis Axis none 309.28 (Adjustment disorder with mixed anxiety and depressed mood) - Open - [Signifier: n/a]    Conditions For Discharge Achievement of treatment goals and objectives     [name], PhD               [name], PhD

## 2022-11-20 ENCOUNTER — Other Ambulatory Visit (HOSPITAL_COMMUNITY): Payer: Self-pay

## 2022-12-11 ENCOUNTER — Other Ambulatory Visit (HOSPITAL_COMMUNITY): Payer: Self-pay

## 2022-12-11 ENCOUNTER — Other Ambulatory Visit: Payer: Self-pay

## 2022-12-11 MED ORDER — SPIRONOLACTONE 50 MG PO TABS
50.0000 mg | ORAL_TABLET | Freq: Every day | ORAL | 0 refills | Status: DC
  Filled 2022-12-11: qty 30, 30d supply, fill #0

## 2022-12-12 ENCOUNTER — Other Ambulatory Visit (HOSPITAL_COMMUNITY): Payer: Self-pay

## 2022-12-14 DIAGNOSIS — H52223 Regular astigmatism, bilateral: Secondary | ICD-10-CM | POA: Diagnosis not present

## 2022-12-14 DIAGNOSIS — H33321 Round hole, right eye: Secondary | ICD-10-CM | POA: Diagnosis not present

## 2022-12-14 DIAGNOSIS — H43811 Vitreous degeneration, right eye: Secondary | ICD-10-CM | POA: Diagnosis not present

## 2022-12-14 DIAGNOSIS — H5213 Myopia, bilateral: Secondary | ICD-10-CM | POA: Diagnosis not present

## 2022-12-17 ENCOUNTER — Other Ambulatory Visit (HOSPITAL_COMMUNITY): Payer: Self-pay

## 2022-12-17 DIAGNOSIS — F908 Attention-deficit hyperactivity disorder, other type: Secondary | ICD-10-CM | POA: Diagnosis not present

## 2022-12-17 DIAGNOSIS — G43909 Migraine, unspecified, not intractable, without status migrainosus: Secondary | ICD-10-CM | POA: Diagnosis not present

## 2022-12-17 DIAGNOSIS — R632 Polyphagia: Secondary | ICD-10-CM | POA: Diagnosis not present

## 2022-12-17 MED ORDER — ZEPBOUND 7.5 MG/0.5ML ~~LOC~~ SOAJ
7.5000 mg | SUBCUTANEOUS | 0 refills | Status: DC
  Filled 2022-12-17 – 2022-12-19 (×2): qty 2, 28d supply, fill #0

## 2022-12-17 MED ORDER — TOPIRAMATE ER 100 MG PO CAP24
1.0000 | ORAL_CAPSULE | Freq: Every day | ORAL | 0 refills | Status: DC
  Filled 2022-12-17 – 2022-12-21 (×2): qty 30, 30d supply, fill #0

## 2022-12-17 MED ORDER — LISDEXAMFETAMINE DIMESYLATE 40 MG PO CAPS
40.0000 mg | ORAL_CAPSULE | Freq: Every morning | ORAL | 0 refills | Status: DC
  Filled 2022-12-17: qty 30, 30d supply, fill #0

## 2022-12-19 ENCOUNTER — Other Ambulatory Visit (HOSPITAL_COMMUNITY): Payer: Self-pay

## 2022-12-20 ENCOUNTER — Other Ambulatory Visit (HOSPITAL_COMMUNITY): Payer: Self-pay

## 2022-12-21 ENCOUNTER — Other Ambulatory Visit (HOSPITAL_COMMUNITY): Payer: Self-pay

## 2022-12-21 ENCOUNTER — Other Ambulatory Visit: Payer: Self-pay

## 2022-12-24 ENCOUNTER — Other Ambulatory Visit (HOSPITAL_COMMUNITY): Payer: Self-pay

## 2022-12-24 MED ORDER — TOPIRAMATE ER 100 MG PO CAP24
100.0000 mg | ORAL_CAPSULE | Freq: Every day | ORAL | 3 refills | Status: DC
  Filled 2022-12-24: qty 90, 90d supply, fill #0

## 2022-12-25 ENCOUNTER — Ambulatory Visit (INDEPENDENT_AMBULATORY_CARE_PROVIDER_SITE_OTHER): Payer: Self-pay | Admitting: Clinical

## 2022-12-25 ENCOUNTER — Other Ambulatory Visit (HOSPITAL_COMMUNITY): Payer: Self-pay

## 2022-12-25 DIAGNOSIS — F4323 Adjustment disorder with mixed anxiety and depressed mood: Secondary | ICD-10-CM

## 2022-12-25 MED ORDER — TOPIRAMATE ER 100 MG PO CAP24
100.0000 mg | ORAL_CAPSULE | Freq: Every day | ORAL | 3 refills | Status: DC
  Filled 2022-12-25 (×2): qty 90, 90d supply, fill #0

## 2022-12-25 NOTE — Progress Notes (Signed)
Diagnosis: F43.23 Time: 10:01 am-10:58 am CPT: [id number]  Cynthia [name] was seen remotely using secure video conferencing. She was in her home and the therapist was in her office at the time of the appointment. Session focused on processing emotions recent developments in relationships with her mother, mother in law, and children. Therapist pointed out a tendency to try to "solve" her daughter's autism, and suggesting exploring other ways to parent her and show up for her. She is scheduled to be seen again in two weeks.  Treatment Plan Client Abilities/Strengths  Cynthia [name] is seeking CBT to help her cope with her daughter's ASD diagnosis and pursue appropriate treatments. She shared that she is also interested in regaining motivation to achieve previous levels of discipline in her lifestyle.  Client Treatment Preferences  Cynthia [name] prefers remote appointments that occur while her children are in school, between 76:30am-12:00am  Client Statement of Needs  Cynthia [name] is seeking CBT to help her cope with and navigate her daughter's diagnosis, as well as establishing and maintaining a self-care routine  Treatment Level  Monthly  Symptoms  Anxiety: sleep irregularities, excessive worry (Status: maintained). Depression: frequent headaches, weight gain, fatigue, sleep difficulties (Status: maintained).  Problems Addressed  New Description, New Description  Goals 1. Cynthia [name] has struggled to maintain self care in recent years Objective Cynthia [name] would like to increase her understanding of her daughter in order to better address her needs Target Date: 2023-08-17 Frequency: Monthly  Progress: 40 Modality: individual  Related Interventions Therapist will incorporate manualized treatments including Parent Training for Disruptive Behaviors in ASD  Therapist will provide referrals for additional resources as appropriate  Objective Cynthia [name] would like to establish a consistent self care routine  Target Date: 2023-08-17  Frequency: Monthly  Progress: 25 Modality: individual  Related Interventions Therapist will incorporate CBT based strategies to help Cynthia [name] identify and disengage from maladaptive thoughts and behaviors Cynthia [name] will be provided opportunities to process experiences in session 2. Cynthia [name] would like to increase self-accountability toward accomplishing set tasks Target date: 09/15/2023 Diagnosis Axis none 309.28 (Adjustment disorder with mixed anxiety and depressed mood) - Open - [Signifier: n/a]    Conditions For Discharge Achievement of treatment goals and objectives       [name], PhD               [name], PhD

## 2023-01-03 DIAGNOSIS — H33321 Round hole, right eye: Secondary | ICD-10-CM | POA: Diagnosis not present

## 2023-01-09 ENCOUNTER — Other Ambulatory Visit: Payer: Self-pay | Admitting: Family Medicine

## 2023-01-09 DIAGNOSIS — Z1231 Encounter for screening mammogram for malignant neoplasm of breast: Secondary | ICD-10-CM

## 2023-01-10 ENCOUNTER — Ambulatory Visit (INDEPENDENT_AMBULATORY_CARE_PROVIDER_SITE_OTHER): Payer: Self-pay | Admitting: Clinical

## 2023-01-10 DIAGNOSIS — F4323 Adjustment disorder with mixed anxiety and depressed mood: Secondary | ICD-10-CM

## 2023-01-10 NOTE — Progress Notes (Signed)
Diagnosis: F43.23 Time: 10:01 am-10:58 am CPT: [id number]  Cynthia [name] was seen remotely using secure video conferencing. She was in her home and the therapist was in her office at the time of the appointment. Session focused on continuing to process dynamics in the relationship with her mother in law, in anticipation of an upcoming visit. She is scheduled to be seen again in two weeks.  Treatment Plan Client Abilities/Strengths  Cynthia [name] is seeking CBT to help her cope with her daughter's ASD diagnosis and pursue appropriate treatments. She shared that she is also interested in regaining motivation to achieve previous levels of discipline in her lifestyle.  Client Treatment Preferences  Cynthia [name] prefers remote appointments that occur while her children are in school, between 61:30am-12:00am  Client Statement of Needs  Cynthia [name] is seeking CBT to help her cope with and navigate her daughter's diagnosis, as well as establishing and maintaining a self-care routine  Treatment Level  Monthly  Symptoms  Anxiety: sleep irregularities, excessive worry (Status: maintained). Depression: frequent headaches, weight gain, fatigue, sleep difficulties (Status: maintained).  Problems Addressed  New Description, New Description  Goals 1. Cynthia [name] has struggled to maintain self care in recent years Objective Cynthia [name] would like to increase her understanding of her daughter in order to better address her needs Target Date: 2023-08-17 Frequency: Monthly  Progress: 40 Modality: individual  Related Interventions Therapist will incorporate manualized treatments including Parent Training for Disruptive Behaviors in ASD  Therapist will provide referrals for additional resources as appropriate  Objective Cynthia [name] would like to establish a consistent self care routine  Target Date: 2023-08-17 Frequency: Monthly  Progress: 25 Modality: individual  Related Interventions Therapist will incorporate CBT based strategies to help  Cynthia [name] identify and disengage from maladaptive thoughts and behaviors Cynthia [name] will be provided opportunities to process experiences in session 2. Cynthia [name] would like to increase self-accountability toward accomplishing set tasks Target date: 09/15/2023 Diagnosis Axis none 309.28 (Adjustment disorder with mixed anxiety and depressed mood) - Open - [Signifier: n/a]    Conditions For Discharge Achievement of treatment goals and objectives    [name], PhD               [name], PhD

## 2023-01-14 ENCOUNTER — Ambulatory Visit: Payer: Self-pay

## 2023-01-17 ENCOUNTER — Ambulatory Visit
Admission: RE | Admit: 2023-01-17 | Discharge: 2023-01-17 | Disposition: A | Discharge status: Home / Self Care | Payer: 59 | Source: Ambulatory Visit | Attending: Family Medicine | Admitting: Family Medicine

## 2023-01-17 DIAGNOSIS — Z1231 Encounter for screening mammogram for malignant neoplasm of breast: Secondary | ICD-10-CM | POA: Diagnosis not present

## 2023-01-17 DIAGNOSIS — H35033 Hypertensive retinopathy, bilateral: Secondary | ICD-10-CM | POA: Diagnosis not present

## 2023-01-18 ENCOUNTER — Telehealth: Payer: Self-pay | Admitting: Family Medicine

## 2023-01-18 NOTE — Telephone Encounter (Signed)
I got a message to thru fax to reschedule appt. Didn't specify which date so I left message.

## 2023-01-21 ENCOUNTER — Encounter: Payer: Self-pay | Admitting: Family Medicine

## 2023-01-21 ENCOUNTER — Ambulatory Visit: Payer: 59 | Admitting: Family Medicine

## 2023-01-21 ENCOUNTER — Other Ambulatory Visit: Payer: Self-pay

## 2023-01-21 ENCOUNTER — Other Ambulatory Visit (HOSPITAL_COMMUNITY): Payer: Self-pay

## 2023-01-21 VITALS — BP 130/84 | HR 82 | Temp 97.0°F | Resp 16 | Ht 62.0 in | Wt 184.2 lb

## 2023-01-21 DIAGNOSIS — I10 Essential (primary) hypertension: Secondary | ICD-10-CM

## 2023-01-21 MED ORDER — LOSARTAN POTASSIUM 50 MG PO TABS
50.0000 mg | ORAL_TABLET | Freq: Every day | ORAL | 1 refills | Status: DC
  Filled 2023-01-21: qty 90, 90d supply, fill #0
  Filled 2023-05-10: qty 90, 90d supply, fill #1

## 2023-01-21 MED ORDER — SPIRONOLACTONE 50 MG PO TABS
50.0000 mg | ORAL_TABLET | Freq: Every day | ORAL | 1 refills | Status: DC
  Filled 2023-01-21: qty 90, 90d supply, fill #0
  Filled 2023-05-10: qty 90, 90d supply, fill #1

## 2023-01-21 MED ORDER — ZEPBOUND 7.5 MG/0.5ML ~~LOC~~ SOAJ
SUBCUTANEOUS | 0 refills | Status: DC
  Filled 2023-01-21: qty 2, 28d supply, fill #0

## 2023-01-21 NOTE — Progress Notes (Signed)
   Subjective:    Patient ID: Cynthia [name], female    DOB: 1981/12/05, 42 y.o.   MRN: [id number]  HPI Elevated BP- went to eye doctor on Friday due to pressure behind L eye.  Saw a cotton wool spot on exam and BP at that time was 150 then decreased to 140.  Pt reports drinking quite a bit of caffeine that afternoon.  Home BP's have been 130s/90s.  Did check BP when she was having an episode of eye pain and it was back up to 150.     Review of Systems For ROS see HPI     Objective:   Physical Exam Vitals reviewed.  Constitutional:      General: She is not in acute distress.    Appearance: Normal appearance. She is not ill-appearing.  HENT:     Head: Normocephalic and atraumatic.  Eyes:     Extraocular Movements: Extraocular movements intact.     Conjunctiva/sclera: Conjunctivae normal.     Pupils: Pupils are equal, round, and reactive to light.  Cardiovascular:     Rate and Rhythm: Normal rate and regular rhythm.  Pulmonary:     Effort: Pulmonary effort is normal. No respiratory distress.     Breath sounds: Normal breath sounds.  Musculoskeletal:     Cervical back: Normal range of motion.  Skin:    General: Skin is warm and dry.  Neurological:     General: No focal deficit present.     Mental Status: She is alert and oriented to person, place, and time.  Psychiatric:        Mood and Affect: Mood normal.        Behavior: Behavior normal.        Thought Content: Thought content normal.           Assessment & Plan:  HTN- new.  Pt reports BP has been elevated on multiple, separate occasions.  Reviewed BP's here in office and while never out of range, she admits that her BP is reactive to stress levels and she has 2 autistic children.  Says she can feel when BP rises.  Based on this, will start low dose medication to keep BP from spiking but to avoid dropping BP too low or causing fatigue.  Will start Losartan '50mg'$  daily and have her return for BP check and BMP.  Pt  expressed understanding and is in agreement w/ plan.

## 2023-01-21 NOTE — Patient Instructions (Signed)
Follow up in 3 weeks to recheck BP and labs START the Losartan once daily Continue to drink LOTS of water Limit your salt intake- this will help your blood pressure Call with any questions or concerns Hang in there!!!

## 2023-01-22 ENCOUNTER — Ambulatory Visit (INDEPENDENT_AMBULATORY_CARE_PROVIDER_SITE_OTHER): Payer: 59 | Admitting: Clinical

## 2023-01-22 DIAGNOSIS — F4323 Adjustment disorder with mixed anxiety and depressed mood: Secondary | ICD-10-CM

## 2023-01-22 NOTE — Progress Notes (Signed)
Diagnosis: F43.23 Time: 10:01 am-10:58 am CPT: [id number]  Cynthia [name] was seen remotely using secure video conferencing. Cynthia [name] joined from the parking lot outside of [employer] [place] [address] and the therapist was in her office at the time of the appointment. Session focused on significant spikes in blood pressure she has been experiencing as a result of day-to-day stress, which have resulted in an eye condition. Therapist provided an opportunity to process stressors, and worked with her to create a plan to re-incorporate regular exercise. Therapist also suggested trying a grounding technique of counting backwards when her children have meltdowns, and referred her to meditation exercises using this strategy. She is scheduled to be seen again in two weeks.  Treatment Plan Client Abilities/Strengths  Cynthia [name] is seeking CBT to help her cope with her daughter's ASD diagnosis and pursue appropriate treatments. She shared that she is also interested in regaining motivation to achieve previous levels of discipline in her lifestyle.  Client Treatment Preferences  Cynthia [name] prefers remote appointments that occur while her children are in school, between 58:30am-12:00am  Client Statement of Needs  Cynthia [name] is seeking CBT to help her cope with and navigate her daughter's diagnosis, as well as establishing and maintaining a self-care routine  Treatment Level  Monthly  Symptoms  Anxiety: sleep irregularities, excessive worry (Status: maintained). Depression: frequent headaches, weight gain, fatigue, sleep difficulties (Status: maintained).  Problems Addressed  New Description, New Description  Goals 1. Cynthia [name] has struggled to maintain self care in recent years Objective Cynthia [name] would like to increase her understanding of her daughter in order to better address her needs Target Date: 2023-08-17 Frequency: Monthly  Progress: 40 Modality: individual  Related Interventions Therapist will incorporate manualized  treatments including Parent Training for Disruptive Behaviors in ASD  Therapist will provide referrals for additional resources as appropriate  Objective Cynthia [name] would like to establish a consistent self care routine  Target Date: 2023-08-17 Frequency: Monthly  Progress: 25 Modality: individual  Related Interventions Therapist will incorporate CBT based strategies to help Cynthia [name] identify and disengage from maladaptive thoughts and behaviors Cynthia [name] will be provided opportunities to process experiences in session 2. Cynthia [name] would like to increase self-accountability toward accomplishing set tasks Target date: 09/15/2023 Diagnosis Axis none 309.28 (Adjustment disorder with mixed anxiety and depressed mood) - Open - [Signifier: n/a]    Conditions For Discharge Achievement of treatment goals and objectives      [name], PhD               [name], PhD

## 2023-01-31 ENCOUNTER — Other Ambulatory Visit (HOSPITAL_COMMUNITY): Payer: Self-pay

## 2023-01-31 DIAGNOSIS — G43809 Other migraine, not intractable, without status migrainosus: Secondary | ICD-10-CM | POA: Diagnosis not present

## 2023-01-31 DIAGNOSIS — E669 Obesity, unspecified: Secondary | ICD-10-CM | POA: Diagnosis not present

## 2023-01-31 DIAGNOSIS — F908 Attention-deficit hyperactivity disorder, other type: Secondary | ICD-10-CM | POA: Diagnosis not present

## 2023-01-31 DIAGNOSIS — Z6833 Body mass index (BMI) 33.0-33.9, adult: Secondary | ICD-10-CM | POA: Diagnosis not present

## 2023-02-01 ENCOUNTER — Other Ambulatory Visit (HOSPITAL_COMMUNITY): Payer: Self-pay

## 2023-02-01 MED ORDER — ZEPBOUND 10 MG/0.5ML ~~LOC~~ SOAJ
10.0000 mg | SUBCUTANEOUS | 1 refills | Status: DC
  Filled 2023-02-01: qty 2, 28d supply, fill #0

## 2023-02-01 MED ORDER — ZEPBOUND 12.5 MG/0.5ML ~~LOC~~ SOAJ
12.5000 mg | SUBCUTANEOUS | 1 refills | Status: DC
  Filled 2023-02-01 – 2023-03-28 (×3): qty 2, 28d supply, fill #0
  Filled 2023-05-10: qty 2, 28d supply, fill #1

## 2023-02-01 MED ORDER — TOPIRAMATE ER 100 MG PO SPRINKLE CAP24
100.0000 mg | EXTENDED_RELEASE_CAPSULE | Freq: Every day | ORAL | 1 refills | Status: DC
  Filled 2023-02-01: qty 30, 30d supply, fill #0
  Filled 2023-03-13 – 2023-03-28 (×2): qty 30, 30d supply, fill #1

## 2023-02-04 ENCOUNTER — Other Ambulatory Visit (HOSPITAL_COMMUNITY): Payer: Self-pay

## 2023-02-04 MED ORDER — LISDEXAMFETAMINE DIMESYLATE 40 MG PO CAPS
40.0000 mg | ORAL_CAPSULE | Freq: Every day | ORAL | 0 refills | Status: DC
  Filled 2023-02-04 – 2023-02-08 (×2): qty 30, 30d supply, fill #0

## 2023-02-05 ENCOUNTER — Other Ambulatory Visit (HOSPITAL_COMMUNITY): Payer: Self-pay

## 2023-02-05 MED ORDER — LISDEXAMFETAMINE DIMESYLATE 40 MG PO CAPS
40.0000 mg | ORAL_CAPSULE | Freq: Every morning | ORAL | 0 refills | Status: DC
  Filled 2023-02-05 – 2023-05-10 (×3): qty 30, 30d supply, fill #0

## 2023-02-08 ENCOUNTER — Other Ambulatory Visit (HOSPITAL_COMMUNITY): Payer: Self-pay

## 2023-02-08 ENCOUNTER — Other Ambulatory Visit: Payer: Self-pay

## 2023-02-08 ENCOUNTER — Ambulatory Visit: Payer: Self-pay | Admitting: Clinical

## 2023-02-11 ENCOUNTER — Encounter: Payer: Self-pay | Admitting: Family Medicine

## 2023-02-11 ENCOUNTER — Ambulatory Visit: Payer: 59 | Admitting: Family Medicine

## 2023-02-11 VITALS — BP 104/74 | HR 80 | Temp 98.0°F | Resp 18 | Ht 62.0 in | Wt 187.3 lb

## 2023-02-11 DIAGNOSIS — I10 Essential (primary) hypertension: Secondary | ICD-10-CM | POA: Diagnosis not present

## 2023-02-11 LAB — BASIC METABOLIC PANEL
BUN: 14 mg/dL (ref 6–23)
CO2: 26 mEq/L (ref 19–32)
Calcium: 8.8 mg/dL (ref 8.4–10.5)
Chloride: 104 mEq/L (ref 96–112)
Creatinine, Ser: 0.81 mg/dL (ref 0.40–1.20)
GFR: 89.54 mL/min (ref >60.00)
Glucose, Bld: 70 mg/dL (ref 70–99)
Potassium: 4.4 mEq/L (ref 3.5–5.1)
Sodium: 136 mEq/L (ref 135–145)

## 2023-02-11 NOTE — Assessment & Plan Note (Signed)
BP is much improved since starting Losartan at last visit.  Currently asymptomatic.  Tolerating medication w/o difficulty.  Check BMP due to ARB use but no anticipated med changes.  Will follow.

## 2023-02-11 NOTE — Progress Notes (Signed)
   Subjective:    Patient ID: Cynthia [name], female    DOB: July 27, 1981, 42 y.o.   MRN: [id number]  HPI HTN- at last visit we started Losartan '50mg'$  daily.  No notable difference in how she feels since starting medication.  No side effects.  No CP, SOB, HA's, visual changes, edema.  Not feeling dizzy or dropping low.   Review of Systems For ROS see HPI     Objective:   Physical Exam Vitals reviewed.  Constitutional:      General: She is not in acute distress.    Appearance: Normal appearance. She is well-developed. She is obese. She is not ill-appearing.  HENT:     Head: Normocephalic and atraumatic.  Eyes:     Conjunctiva/sclera: Conjunctivae normal.     Pupils: Pupils are equal, round, and reactive to light.  Neck:     Thyroid: No thyromegaly.  Cardiovascular:     Rate and Rhythm: Normal rate and regular rhythm.     Heart sounds: Normal heart sounds. No murmur heard. Pulmonary:     Effort: Pulmonary effort is normal. No respiratory distress.     Breath sounds: Normal breath sounds.  Abdominal:     General: There is no distension.     Palpations: Abdomen is soft.     Tenderness: There is no abdominal tenderness.  Musculoskeletal:     Cervical back: Normal range of motion and neck supple.  Lymphadenopathy:     Cervical: No cervical adenopathy.  Skin:    General: Skin is warm and dry.  Neurological:     General: No focal deficit present.     Mental Status: She is alert and oriented to person, place, and time.  Psychiatric:        Behavior: Behavior normal.          Assessment & Plan:

## 2023-02-11 NOTE — Patient Instructions (Signed)
Follow up in 6 months to recheck BP We'll notify you of your lab results and make any changes if needed CONTINUE the Losartan daily Call with any questions or concerns Stay Safe!  Stay Healthy! Safe Travels!!!

## 2023-02-12 ENCOUNTER — Telehealth: Payer: Self-pay

## 2023-02-12 ENCOUNTER — Other Ambulatory Visit: Payer: Self-pay

## 2023-02-12 NOTE — Telephone Encounter (Signed)
-----   Message from Midge Minium, MD sent at 02/12/2023  7:19 AM EST ----- Labs look great!  No changes at this time

## 2023-02-12 NOTE — Telephone Encounter (Signed)
Left pt  a VM with lab results

## 2023-02-13 DIAGNOSIS — H35033 Hypertensive retinopathy, bilateral: Secondary | ICD-10-CM | POA: Diagnosis not present

## 2023-02-14 ENCOUNTER — Ambulatory Visit (INDEPENDENT_AMBULATORY_CARE_PROVIDER_SITE_OTHER): Payer: 59 | Admitting: Clinical

## 2023-02-14 DIAGNOSIS — H35413 Lattice degeneration of retina, bilateral: Secondary | ICD-10-CM | POA: Diagnosis not present

## 2023-02-14 DIAGNOSIS — H43823 Vitreomacular adhesion, bilateral: Secondary | ICD-10-CM | POA: Diagnosis not present

## 2023-02-14 NOTE — Progress Notes (Signed)
Visit scheduled in error               Cynthia Chess, PhD

## 2023-02-15 ENCOUNTER — Other Ambulatory Visit (HOSPITAL_COMMUNITY): Payer: Self-pay

## 2023-02-18 ENCOUNTER — Other Ambulatory Visit (HOSPITAL_COMMUNITY): Payer: Self-pay

## 2023-02-18 MED ORDER — ZEPBOUND 10 MG/0.5ML ~~LOC~~ SOAJ
10.0000 mg | SUBCUTANEOUS | 1 refills | Status: DC
  Filled 2023-02-18: qty 2, 28d supply, fill #0

## 2023-02-19 ENCOUNTER — Other Ambulatory Visit (HOSPITAL_COMMUNITY): Payer: Self-pay

## 2023-02-27 ENCOUNTER — Other Ambulatory Visit (INDEPENDENT_AMBULATORY_CARE_PROVIDER_SITE_OTHER): Payer: Self-pay | Admitting: Family Medicine

## 2023-02-27 DIAGNOSIS — F32A Depression, unspecified: Secondary | ICD-10-CM

## 2023-02-28 ENCOUNTER — Other Ambulatory Visit (HOSPITAL_COMMUNITY): Payer: Self-pay

## 2023-02-28 MED ORDER — ESCITALOPRAM OXALATE 20 MG PO TABS
20.0000 mg | ORAL_TABLET | Freq: Every day | ORAL | 1 refills | Status: DC
  Filled 2023-02-28: qty 90, 90d supply, fill #0
  Filled 2023-04-05 – 2023-06-23 (×2): qty 90, 90d supply, fill #1

## 2023-03-07 ENCOUNTER — Ambulatory Visit: Payer: 59 | Admitting: Clinical

## 2023-03-07 DIAGNOSIS — F4323 Adjustment disorder with mixed anxiety and depressed mood: Secondary | ICD-10-CM | POA: Diagnosis not present

## 2023-03-07 NOTE — Progress Notes (Signed)
Diagnosis: F43.23 Time: 10:01 am-10:58 am CPT: [id number]  Cynthia [name] was seen remotely using secure video conferencing. Cynthia [name] joined from the parking lot outside of [employer] [place] [address] and the therapist was in her office at the time of the appointment. Session focused on significant financial stress that had arisen since her last session. Therapist pointed out the difference between clock time and psychological time, and suggested using pockets of time where she is waiting for her children in appointments to clear out psychological time, using strategies such as meditation (therapist suggested the [facility]) and finding a book series she enjoys. She is scheduled to be seen again in two weeks.  Treatment Plan Client Abilities/Strengths  Cynthia [name] is seeking CBT to help her cope with her daughter's ASD diagnosis and pursue appropriate treatments. She shared that she is also interested in regaining motivation to achieve previous levels of discipline in her lifestyle.  Client Treatment Preferences  Cynthia [name] prefers remote appointments that occur while her children are in school, between 57:30am-12:00am  Client Statement of Needs  Cynthia [name] is seeking CBT to help her cope with and navigate her daughter's diagnosis, as well as establishing and maintaining a self-care routine  Treatment Level  Monthly  Symptoms  Anxiety: sleep irregularities, excessive worry (Status: maintained). Depression: frequent headaches, weight gain, fatigue, sleep difficulties (Status: maintained).  Problems Addressed  New Description, New Description  Goals 1. Cynthia [name] has struggled to maintain self care in recent years Objective Cynthia [name] would like to increase her understanding of her daughter in order to better address her needs Target Date: 2023-08-17 Frequency: Monthly  Progress: 40 Modality: individual  Related Interventions Therapist will incorporate manualized treatments including Parent Training for Disruptive  Behaviors in ASD  Therapist will provide referrals for additional resources as appropriate  Objective Cynthia [name] would like to establish a consistent self care routine  Target Date: 2023-08-17 Frequency: Monthly  Progress: 25 Modality: individual  Related Interventions Therapist will incorporate CBT based strategies to help Cynthia [name] identify and disengage from maladaptive thoughts and behaviors Cynthia [name] will be provided opportunities to process experiences in session 2. Cynthia [name] would like to increase self-accountability toward accomplishing set tasks Target date: 09/15/2023 Diagnosis Axis none 309.28 (Adjustment disorder with mixed anxiety and depressed mood) - Open - [Signifier: n/a]    Conditions For Discharge Achievement of treatment goals and objectives          [name], PhD               [name], PhD

## 2023-03-13 ENCOUNTER — Other Ambulatory Visit (HOSPITAL_COMMUNITY): Payer: Self-pay

## 2023-03-13 ENCOUNTER — Other Ambulatory Visit: Payer: Self-pay

## 2023-03-13 MED ORDER — LISDEXAMFETAMINE DIMESYLATE 40 MG PO CAPS
40.0000 mg | ORAL_CAPSULE | Freq: Every day | ORAL | 0 refills | Status: DC
  Filled 2023-03-13 – 2023-03-28 (×2): qty 30, 30d supply, fill #0

## 2023-03-27 ENCOUNTER — Other Ambulatory Visit (HOSPITAL_COMMUNITY): Payer: Self-pay

## 2023-03-28 ENCOUNTER — Other Ambulatory Visit: Payer: Self-pay

## 2023-03-28 ENCOUNTER — Other Ambulatory Visit (HOSPITAL_COMMUNITY): Payer: Self-pay

## 2023-03-29 ENCOUNTER — Other Ambulatory Visit (HOSPITAL_COMMUNITY): Payer: Self-pay

## 2023-03-29 ENCOUNTER — Other Ambulatory Visit: Payer: Self-pay

## 2023-04-01 ENCOUNTER — Other Ambulatory Visit: Payer: Self-pay

## 2023-04-02 ENCOUNTER — Ambulatory Visit: Payer: Self-pay | Admitting: Clinical

## 2023-04-03 ENCOUNTER — Ambulatory Visit: Payer: 59 | Admitting: Family Medicine

## 2023-04-04 ENCOUNTER — Other Ambulatory Visit (HOSPITAL_COMMUNITY): Payer: Self-pay

## 2023-04-04 DIAGNOSIS — E669 Obesity, unspecified: Secondary | ICD-10-CM | POA: Diagnosis not present

## 2023-04-04 DIAGNOSIS — Z6834 Body mass index (BMI) 34.0-34.9, adult: Secondary | ICD-10-CM | POA: Diagnosis not present

## 2023-04-04 DIAGNOSIS — F908 Attention-deficit hyperactivity disorder, other type: Secondary | ICD-10-CM | POA: Diagnosis not present

## 2023-04-04 DIAGNOSIS — Z72820 Sleep deprivation: Secondary | ICD-10-CM | POA: Diagnosis not present

## 2023-04-04 DIAGNOSIS — G43809 Other migraine, not intractable, without status migrainosus: Secondary | ICD-10-CM | POA: Diagnosis not present

## 2023-04-04 MED ORDER — LISDEXAMFETAMINE DIMESYLATE 40 MG PO CAPS
40.0000 mg | ORAL_CAPSULE | Freq: Every morning | ORAL | 0 refills | Status: DC
  Filled 2023-04-04: qty 30, 30d supply, fill #0

## 2023-04-04 MED ORDER — LISDEXAMFETAMINE DIMESYLATE 40 MG PO CAPS
40.0000 mg | ORAL_CAPSULE | Freq: Every day | ORAL | 0 refills | Status: DC
  Filled 2023-04-04 – 2023-05-10 (×2): qty 30, 30d supply, fill #0

## 2023-04-05 ENCOUNTER — Other Ambulatory Visit (HOSPITAL_COMMUNITY): Payer: Self-pay

## 2023-04-10 ENCOUNTER — Other Ambulatory Visit (HOSPITAL_COMMUNITY): Payer: Self-pay

## 2023-04-12 ENCOUNTER — Other Ambulatory Visit (HOSPITAL_COMMUNITY): Payer: Self-pay

## 2023-04-15 ENCOUNTER — Other Ambulatory Visit (HOSPITAL_COMMUNITY): Payer: Self-pay

## 2023-04-15 MED ORDER — LISDEXAMFETAMINE DIMESYLATE 20 MG PO CAPS
20.0000 mg | ORAL_CAPSULE | Freq: Every day | ORAL | 0 refills | Status: DC
  Filled 2023-04-15: qty 30, 30d supply, fill #0

## 2023-04-17 ENCOUNTER — Other Ambulatory Visit (INDEPENDENT_AMBULATORY_CARE_PROVIDER_SITE_OTHER): Payer: 59

## 2023-04-17 ENCOUNTER — Other Ambulatory Visit: Payer: Self-pay

## 2023-04-17 DIAGNOSIS — E785 Hyperlipidemia, unspecified: Secondary | ICD-10-CM

## 2023-04-17 DIAGNOSIS — R7303 Prediabetes: Secondary | ICD-10-CM

## 2023-04-17 DIAGNOSIS — E559 Vitamin D deficiency, unspecified: Secondary | ICD-10-CM | POA: Diagnosis not present

## 2023-04-17 LAB — COMPREHENSIVE METABOLIC PANEL
ALT: 11 U/L (ref 0–35)
AST: 15 U/L (ref 0–37)
Albumin: 3.9 g/dL (ref 3.5–5.2)
Alkaline Phosphatase: 59 U/L (ref 39–117)
BUN: 15 mg/dL (ref 6–23)
CO2: 21 mEq/L (ref 19–32)
Calcium: 9 mg/dL (ref 8.4–10.5)
Chloride: 107 mEq/L (ref 96–112)
Creatinine, Ser: 0.93 mg/dL (ref 0.40–1.20)
GFR: 75.76 mL/min (ref >60.00)
Glucose, Bld: 88 mg/dL (ref 70–99)
Potassium: 3.9 mEq/L (ref 3.5–5.1)
Sodium: 137 mEq/L (ref 135–145)
Total Bilirubin: 0.3 mg/dL (ref 0.2–1.2)
Total Protein: 6.8 g/dL (ref 6.0–8.3)

## 2023-04-17 LAB — VITAMIN D 25 HYDROXY (VIT D DEFICIENCY, FRACTURES): VITD: 25.92 ng/mL — ABNORMAL LOW (ref 30.00–100.00)

## 2023-04-17 LAB — LIPID PANEL
Cholesterol: 206 mg/dL — ABNORMAL HIGH (ref 0–200)
HDL: 45.6 mg/dL (ref >39.00)
LDL Cholesterol: 138 mg/dL — ABNORMAL HIGH (ref 0–99)
NonHDL: 160.16
Total CHOL/HDL Ratio: 5
Triglycerides: 112 mg/dL (ref 0.0–149.0)
VLDL: 22.4 mg/dL (ref 0.0–40.0)

## 2023-04-17 LAB — HEMOGLOBIN A1C: Hgb A1c MFr Bld: 5.5 % (ref 4.6–6.5)

## 2023-04-17 NOTE — Addendum Note (Signed)
Addended by: Eldred Manges on: 04/17/2023 11:26 AM   Modules accepted: Orders

## 2023-04-18 ENCOUNTER — Other Ambulatory Visit: Payer: Self-pay

## 2023-04-18 ENCOUNTER — Telehealth: Payer: Self-pay

## 2023-04-18 ENCOUNTER — Other Ambulatory Visit (HOSPITAL_COMMUNITY): Payer: Self-pay

## 2023-04-18 ENCOUNTER — Encounter: Payer: Self-pay | Admitting: Family Medicine

## 2023-04-18 ENCOUNTER — Telehealth (INDEPENDENT_AMBULATORY_CARE_PROVIDER_SITE_OTHER): Payer: 59 | Admitting: Family Medicine

## 2023-04-18 DIAGNOSIS — E559 Vitamin D deficiency, unspecified: Secondary | ICD-10-CM

## 2023-04-18 DIAGNOSIS — H3581 Retinal edema: Secondary | ICD-10-CM | POA: Diagnosis not present

## 2023-04-18 MED ORDER — VITAMIN D (ERGOCALCIFEROL) 1.25 MG (50000 UNIT) PO CAPS
50000.0000 [IU] | ORAL_CAPSULE | ORAL | 12 refills | Status: DC
Start: 2023-04-18 — End: 2023-12-16
  Filled 2023-04-18: qty 7, 49d supply, fill #0
  Filled 2023-05-10 – 2023-06-23 (×2): qty 7, 49d supply, fill #1
  Filled 2023-09-17: qty 7, 49d supply, fill #2

## 2023-04-18 NOTE — Telephone Encounter (Signed)
-----   Message from Sheliah Hatch, MD sent at 04/18/2023  7:43 AM EDT ----- Labs look great w/ exception of low Vit D.  Based on this, we need to start 50,000 units weekly x12 weeks in addition to daily OTC supplement of at least 2000 units.

## 2023-04-18 NOTE — Progress Notes (Signed)
Virtual Visit via Video   I connected with patient on 04/18/23 at  1:40 PM EDT by a video enabled telemedicine application and verified that I am speaking with the correct person using two identifiers.  Location patient: Home Location provider: [facility] Persons participating in the virtual visit: Patient, Provider, CMA Sheryle Hail C)  I discussed the limitations of evaluation and management by telemedicine and the availability of in person appointments. The patient expressed understanding and agreed to proceed.  Subjective:   HPI:   Hyperlipidemia- pt was told by ophthalmology that she had a cotton wool spot.  On follow up this had resolved but she saw lipid deposits in her eye.  Lipids done 5/8 show total cholesterol 206, LDL 138, HDL 45.6  Pt is asking what the next steps are.  She has f/u eye appt in 2 weeks.  ROS:   See pertinent positives and negatives per HPI.  Patient Active Problem List   Diagnosis Date Noted   Primary hypertension 02/11/2023   Anxiety and depression 12/31/2020   Pre-diabetes 11/30/2020   At risk for deficient intake of food 11/14/2020   Frequent headaches 09/28/2020   Vitamin D deficiency 08/12/2019   PCOS (polycystic ovarian syndrome) 05/06/2018   GERD (gastroesophageal reflux disease) 02/13/2018   Eustachian tube anomaly 02/13/2018   Depression, recurrent (HCC) 01/03/2018   Gestational diabetes 01/23/2017   Physical exam 12/28/2015   Pelvic adhesive disease 06/17/2015    Class: Present on Admission   Menorrhagia with irregular cycle 12/20/2014   Anemia 12/20/2014   Hyperlipidemia 11/22/2014   Obesity (BMI 30-39.9) 11/22/2014   Anxiety state 11/22/2014    Social History   Tobacco Use   Smoking status: Former    Packs/day: 1.00    Years: 10.00    Additional pack years: 0.00    Total pack years: 10.00    Types: Cigarettes    Quit date: 11/23/2011    Years since quitting: 11.4   Smokeless tobacco: Never  Substance Use  Topics   Alcohol use: Yes    Comment: rare    Current Outpatient Medications:    ALPRAZolam (XANAX) 0.5 MG tablet, Take 0.5-1 tablets (0.25-0.5 mg total) by mouth 2 (two) times daily as needed for anxiety., Disp: 30 tablet, Rfl: 1   baclofen (LIORESAL) 10 MG tablet, Take 1 tablet (10 mg total) by mouth at bedtime as needed for muscle spasms., Disp: 30 each, Rfl: 0   cetirizine (ZYRTEC) 10 MG tablet, Take 10 mg by mouth daily., Disp: , Rfl:    diphenhydrAMINE (BENADRYL) 50 MG capsule, Take 50 mg by mouth at bedtime as needed., Disp: , Rfl:    ergocalciferol (DRISDOL) 1.25 MG (50000 UT) capsule, Take 1 capsule (50,000 Units total) by mouth every 7 (seven) days., Disp: 4 capsule, Rfl: 0   escitalopram (LEXAPRO) 20 MG tablet, Take 1 tablet (20 mg total) by mouth daily., Disp: 90 tablet, Rfl: 1   lisdexamfetamine (VYVANSE) 40 MG capsule, Take 1 capsule (40 mg total) by mouth every day around mid-morning., Disp: 30 capsule, Rfl: 0   lisdexamfetamine (VYVANSE) 40 MG capsule, Take 1 capsule (40 mg total) by mouth daily around mid-morning, Disp: 30 capsule, Rfl: 0   lisdexamfetamine (VYVANSE) 40 MG capsule, Take 1 capsule (40 mg total) by mouth in the morning., Disp: 30 capsule, Rfl: 0   lisdexamfetamine (VYVANSE) 40 MG capsule, Take 1 capsule (40 mg total) by mouth daily in the afternoon., Disp: 30 capsule, Rfl: 0   losartan (COZAAR)  50 MG tablet, Take 1 tablet (50 mg total) by mouth daily., Disp: 90 tablet, Rfl: 1   norethindrone-ethinyl estradiol (LOESTRIN) 1-20 MG-MCG tablet, Take 1 tablet by mouth daily., Disp: 28 tablet, Rfl: 11   spironolactone (ALDACTONE) 50 MG tablet, Take 1 tablet (50 mg total) by mouth daily., Disp: 90 tablet, Rfl: 1   tirzepatide (ZEPBOUND) 12.5 MG/0.5ML Pen, Inject 12.5 mg into the skin once a week., Disp: 2 mL, Rfl: 1   topiramate ER (QUDEXY XR) 100 MG CS24 sprinkle capsule, Take 1 capsule (100 mg total) by mouth daily., Disp: 30 capsule, Rfl: 1   Vitamin D, Ergocalciferol,  (DRISDOL) 1.25 MG (50000 UNIT) CAPS capsule, Take 1 capsule (50,000 Units total) by mouth every 7 (seven) days., Disp: 4 capsule, Rfl: 2   Vitamin D, Ergocalciferol, (DRISDOL) 1.25 MG (50000 UNIT) CAPS capsule, Take 1 capsule (50,000 Units total) by mouth every 7 (seven) days., Disp: 7 capsule, Rfl: 12   fluticasone (FLONASE) 50 MCG/ACT nasal spray, PLACE 2 SPRAYS INTO BOTH NOSTRILS DAILY., Disp: 16 g, Rfl: 6   lisdexamfetamine (VYVANSE) 20 MG capsule, Take 1 capsule (20 mg total) by mouth daily in the afternoon. (Patient not taking: Reported on 04/18/2023), Disp: 30 capsule, Rfl: 0   tirzepatide (ZEPBOUND) 10 MG/0.5ML Pen, Inject 10 mg into the skin once a week. (Patient not taking: Reported on 04/18/2023), Disp: 2 mL, Rfl: 1   tirzepatide (ZEPBOUND) 10 MG/0.5ML Pen, Inject 10 mg into the skin once a week. (Patient not taking: Reported on 04/18/2023), Disp: 2 mL, Rfl: 1  No Known Allergies  Objective:   There were no vitals taken for this visit. AAOx3, NAD NCAT, EOMI No obvious CN deficits Coloring WNL Pt is able to speak clearly, coherently without shortness of breath or increased work of breathing.  Thought process is linear.  Mood is appropriate.   Assessment and Plan:  Cotton wool spots- new.  Pt reports this was seen on eye exam.  Resolved at recheck but lipid deposits were noted.  Has follow up in 2 weeks.  Lipids done 5/8 were not considerably out of range.  Pt will notify me after eye recheck if deposits remain.  If yes, we can discuss starting statin.  If area has resolved, we can repeat lipids in 6 months to monitor.  Pt expressed understanding and is in agreement w/ plan.    Neena Rhymes, MD 04/18/2023

## 2023-04-18 NOTE — Telephone Encounter (Signed)
Pt aware of lab results and Vit D 50,000 units has been sent in

## 2023-05-02 DIAGNOSIS — H43811 Vitreous degeneration, right eye: Secondary | ICD-10-CM | POA: Diagnosis not present

## 2023-05-02 DIAGNOSIS — H35033 Hypertensive retinopathy, bilateral: Secondary | ICD-10-CM | POA: Diagnosis not present

## 2023-05-02 DIAGNOSIS — H33321 Round hole, right eye: Secondary | ICD-10-CM | POA: Diagnosis not present

## 2023-05-03 ENCOUNTER — Ambulatory Visit (INDEPENDENT_AMBULATORY_CARE_PROVIDER_SITE_OTHER): Payer: 59 | Admitting: Clinical

## 2023-05-03 DIAGNOSIS — F4323 Adjustment disorder with mixed anxiety and depressed mood: Secondary | ICD-10-CM

## 2023-05-03 NOTE — Progress Notes (Signed)
Diagnosis: F43.23 Time: 10:01 am-10:58 am CPT: 16109U-04  Cynthia [name] was seen remotely using secure video conferencing. Cynthia [name] joined from her home and the therapist was in her office at the time of the appointment. Session focused on recent developments in her children's educational team. Therapist offered validation and support, and suggested several resources. She is scheduled to be seen again in one month.  Treatment Plan Client Abilities/Strengths  Cynthia [name] is seeking CBT to help her cope with her daughter's ASD diagnosis and pursue appropriate treatments. She shared that she is also interested in regaining motivation to achieve previous levels of discipline in her lifestyle.  Client Treatment Preferences  Cynthia [name] prefers remote appointments that occur while her children are in school, between 9:30am-12:00am  Client Statement of Needs  Cynthia [name] is seeking CBT to help her cope with and navigate her daughter's diagnosis, as well as establishing and maintaining a self-care routine  Treatment Level  Monthly  Symptoms  Anxiety: sleep irregularities, excessive worry (Status: maintained). Depression: frequent headaches, weight gain, fatigue, sleep difficulties (Status: maintained).  Problems Addressed  New Description, New Description  Goals 1. Cynthia [name] has struggled to maintain self care in recent years Objective Cynthia [name] would like to increase her understanding of her daughter in order to better address her needs Target Date: 2023-08-17 Frequency: Monthly  Progress: 40 Modality: individual  Related Interventions Therapist will incorporate manualized treatments including Parent Training for Disruptive Behaviors in ASD  Therapist will provide referrals for additional resources as appropriate  Objective Cynthia [name] would like to establish a consistent self care routine  Target Date: 2023-08-17 Frequency: Monthly  Progress: 25 Modality: individual  Related Interventions Therapist will incorporate CBT  based strategies to help Cynthia [name] identify and disengage from maladaptive thoughts and behaviors Cynthia [name] will be provided opportunities to process experiences in session 2. Cynthia [name] would like to increase self-accountability toward accomplishing set tasks Target date: 09/15/2023 Diagnosis Axis none 309.28 (Adjustment disorder with mixed anxiety and depressed mood) - Open - [Signifier: n/a]    Conditions For Discharge Achievement of treatment goals and objectives    Cynthia Noa, PhD               Cynthia Noa, PhD

## 2023-05-10 ENCOUNTER — Other Ambulatory Visit (HOSPITAL_COMMUNITY): Payer: Self-pay

## 2023-05-10 ENCOUNTER — Other Ambulatory Visit: Payer: Self-pay | Admitting: Family Medicine

## 2023-05-13 ENCOUNTER — Other Ambulatory Visit: Payer: Self-pay

## 2023-05-13 ENCOUNTER — Other Ambulatory Visit (HOSPITAL_COMMUNITY): Payer: Self-pay

## 2023-05-13 MED ORDER — TOPIRAMATE ER 100 MG PO SPRINKLE CAP24
100.0000 mg | EXTENDED_RELEASE_CAPSULE | Freq: Every day | ORAL | 3 refills | Status: DC
  Filled 2023-05-13: qty 90, 90d supply, fill #0
  Filled 2023-06-23 – 2023-08-07 (×2): qty 90, 90d supply, fill #1
  Filled 2023-12-24 – 2024-02-18 (×2): qty 90, 90d supply, fill #2

## 2023-05-13 MED ORDER — LISDEXAMFETAMINE DIMESYLATE 40 MG PO CAPS
40.0000 mg | ORAL_CAPSULE | Freq: Every day | ORAL | 0 refills | Status: DC
  Filled 2023-05-13 – 2023-06-23 (×2): qty 30, 30d supply, fill #0

## 2023-05-14 ENCOUNTER — Other Ambulatory Visit (HOSPITAL_COMMUNITY): Payer: Self-pay

## 2023-05-16 ENCOUNTER — Other Ambulatory Visit (HOSPITAL_COMMUNITY): Payer: Self-pay

## 2023-05-20 ENCOUNTER — Encounter (HOSPITAL_COMMUNITY): Payer: Self-pay

## 2023-05-20 ENCOUNTER — Other Ambulatory Visit (HOSPITAL_COMMUNITY): Payer: Self-pay

## 2023-05-21 ENCOUNTER — Other Ambulatory Visit (HOSPITAL_COMMUNITY): Payer: Self-pay

## 2023-06-03 ENCOUNTER — Ambulatory Visit (INDEPENDENT_AMBULATORY_CARE_PROVIDER_SITE_OTHER): Payer: 59 | Admitting: Clinical

## 2023-06-03 DIAGNOSIS — F4323 Adjustment disorder with mixed anxiety and depressed mood: Secondary | ICD-10-CM

## 2023-06-03 NOTE — Progress Notes (Signed)
Diagnosis: F43.23 Time: 9:01 am-9:58 am CPT: 16109U-04  Christyana was seen remotely using secure video conferencing. Amayia joined from her home and the therapist was in her office at the time of the appointment. Session focused on feelings of despair and overwhelm related to recent challenges in her child's behavior. Therapist offered validation and support, and encouraged her to explore how she might be able to communicate needs with others in her life. She is scheduled to be seen again in two weeks.  Treatment Plan Client Abilities/Strengths  Temitope is seeking CBT to help her cope with her daughter's ASD diagnosis and pursue appropriate treatments. She shared that she is also interested in regaining motivation to achieve previous levels of discipline in her lifestyle.  Client Treatment Preferences  Mabry prefers remote appointments that occur while her children are in school, between 9:30am-12:00am  Client Statement of Needs  Pier is seeking CBT to help her cope with and navigate her daughter's diagnosis, as well as establishing and maintaining a self-care routine  Treatment Level  Monthly  Symptoms  Anxiety: sleep irregularities, excessive worry (Status: maintained). Depression: frequent headaches, weight gain, fatigue, sleep difficulties (Status: maintained).  Problems Addressed  New Description, New Description  Goals 1. Altair has struggled to maintain self care in recent years Objective Elis would like to increase her understanding of her daughter in order to better address her needs Target Date: 2023-08-17 Frequency: Monthly  Progress: 40 Modality: individual  Related Interventions Therapist will incorporate manualized treatments including Parent Training for Disruptive Behaviors in ASD  Therapist will provide referrals for additional resources as appropriate  Objective Keamber would like to establish a consistent self care routine  Target Date: 2023-08-17 Frequency: Monthly   Progress: 25 Modality: individual  Related Interventions Therapist will incorporate CBT based strategies to help Carlie identify and disengage from maladaptive thoughts and behaviors Elease will be provided opportunities to process experiences in session 2. Ayushi would like to increase self-accountability toward accomplishing set tasks Target date: 09/15/2023 Diagnosis Axis none 309.28 (Adjustment disorder with mixed anxiety and depressed mood) - Open - [Signifier: n/a]    Conditions For Discharge Achievement of treatment goals and objectives    Chrissie Noa, PhD               Chrissie Noa, PhD

## 2023-06-21 ENCOUNTER — Ambulatory Visit: Payer: 59 | Admitting: Clinical

## 2023-06-23 ENCOUNTER — Other Ambulatory Visit (HOSPITAL_COMMUNITY): Payer: Self-pay

## 2023-06-24 ENCOUNTER — Other Ambulatory Visit: Payer: Self-pay

## 2023-06-24 ENCOUNTER — Other Ambulatory Visit (HOSPITAL_COMMUNITY): Payer: Self-pay

## 2023-06-24 MED ORDER — VITAMIN D (ERGOCALCIFEROL) 1.25 MG (50000 UNIT) PO CAPS
50000.0000 [IU] | ORAL_CAPSULE | ORAL | 3 refills | Status: DC
  Filled 2023-06-24 – 2023-08-07 (×2): qty 4, 28d supply, fill #0
  Filled 2023-12-24: qty 4, 28d supply, fill #1
  Filled 2024-02-18: qty 4, 28d supply, fill #2
  Filled 2024-04-23 – 2024-05-06 (×2): qty 4, 28d supply, fill #3

## 2023-07-02 ENCOUNTER — Ambulatory Visit (INDEPENDENT_AMBULATORY_CARE_PROVIDER_SITE_OTHER): Payer: 59 | Admitting: Clinical

## 2023-07-02 DIAGNOSIS — F4323 Adjustment disorder with mixed anxiety and depressed mood: Secondary | ICD-10-CM

## 2023-07-02 NOTE — Progress Notes (Signed)
Diagnosis: F43.23 Time: 2:01 pm-2:58 pm CPT: 18841Y-60  [name] was seen remotely using secure video conferencing. Cynthia [name] joined from her home and the therapist was in her office at the time of the appointment. Client is aware of risks of telehealth and consented to a virtual visit. She reported improvements in her daughter's mood and behavior since her last session, which had led to a sense of relief. 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 Therapist encouraged her to continue exploring opportunities for purpose and a sense of control. She is scheduled to be seen again in one week,  Treatment Plan Client Abilities/Strengths  Cynthia [name] is seeking CBT to help her cope with her daughter's ASD diagnosis and pursue appropriate treatments. She shared that she is also interested in regaining motivation to achieve previous levels of discipline in her lifestyle.  Client Treatment Preferences  Cynthia [name] prefers remote appointments that occur while her children are in school, between 9:30am-12:00am  Client Statement of Needs  Cynthia [name] is seeking CBT to help her cope with and navigate her daughter's diagnosis, as well as establishing and maintaining a self-care routine  Treatment Level  Monthly  Symptoms  Anxiety: sleep irregularities, excessive worry (Status: maintained). Depression: frequent headaches, weight gain, fatigue, sleep difficulties (Status: maintained).  Problems Addressed  New Description, New Description  Goals 1. Cynthia [name] has struggled to maintain self care in recent years Objective Cynthia [name] would like to increase her understanding of her daughter in order to better address her needs Target Date: 2023-08-17 Frequency: Monthly  Progress: 40 Modality: individual  Related Interventions Therapist will incorporate manualized treatments including Parent Training for Disruptive Behaviors in ASD   Therapist will provide referrals for additional resources as appropriate  Objective Cynthia [name] would like to establish a consistent self care routine  Target Date: 2023-08-17 Frequency: Monthly  Progress: 25 Modality: individual  Related Interventions Therapist will incorporate CBT based strategies to help Cynthia [name] identify and disengage from maladaptive thoughts and behaviors Cynthia [name] will be provided opportunities to process experiences in session 2. Cynthia [name] would like to increase self-accountability toward accomplishing set tasks Target date: 09/15/2023 Diagnosis Axis none 309.28 (Adjustment disorder with mixed anxiety and depressed mood) - Open - [Signifier: n/a]    Conditions For Discharge Achievement of treatment goals and objectives   Chrissie Noa, PhD               Chrissie Noa, PhD

## 2023-07-04 ENCOUNTER — Other Ambulatory Visit (HOSPITAL_COMMUNITY): Payer: Self-pay

## 2023-07-04 DIAGNOSIS — E669 Obesity, unspecified: Secondary | ICD-10-CM | POA: Diagnosis not present

## 2023-07-04 DIAGNOSIS — F908 Attention-deficit hyperactivity disorder, other type: Secondary | ICD-10-CM | POA: Diagnosis not present

## 2023-07-04 DIAGNOSIS — G43809 Other migraine, not intractable, without status migrainosus: Secondary | ICD-10-CM | POA: Diagnosis not present

## 2023-07-04 DIAGNOSIS — Z6833 Body mass index (BMI) 33.0-33.9, adult: Secondary | ICD-10-CM | POA: Diagnosis not present

## 2023-07-04 DIAGNOSIS — I10 Essential (primary) hypertension: Secondary | ICD-10-CM | POA: Diagnosis not present

## 2023-07-04 MED ORDER — LISDEXAMFETAMINE DIMESYLATE 20 MG PO CAPS
20.0000 mg | ORAL_CAPSULE | Freq: Every day | ORAL | 0 refills | Status: DC
Start: 2023-07-04 — End: 2023-08-08
  Filled 2023-07-04: qty 30, 30d supply, fill #0

## 2023-07-04 MED ORDER — ZEPBOUND 12.5 MG/0.5ML ~~LOC~~ SOAJ
12.5000 mg | SUBCUTANEOUS | 11 refills | Status: DC
  Filled 2023-07-04: qty 2, 28d supply, fill #0
  Filled 2023-08-07: qty 2, 28d supply, fill #1
  Filled 2023-09-17: qty 2, 28d supply, fill #2
  Filled 2023-11-01: qty 2, 28d supply, fill #3
  Filled 2023-11-28: qty 2, 28d supply, fill #4
  Filled 2023-12-24: qty 2, 28d supply, fill #5
  Filled 2024-02-10: qty 2, 28d supply, fill #6

## 2023-07-04 MED ORDER — LISDEXAMFETAMINE DIMESYLATE 40 MG PO CAPS
40.0000 mg | ORAL_CAPSULE | Freq: Every day | ORAL | 0 refills | Status: DC
Start: 2023-07-04 — End: 2023-12-16
  Filled 2023-07-04 – 2023-08-07 (×2): qty 30, 30d supply, fill #0

## 2023-07-05 ENCOUNTER — Other Ambulatory Visit: Payer: Self-pay

## 2023-07-11 ENCOUNTER — Ambulatory Visit (INDEPENDENT_AMBULATORY_CARE_PROVIDER_SITE_OTHER): Payer: 59 | Admitting: Clinical

## 2023-07-11 DIAGNOSIS — F4323 Adjustment disorder with mixed anxiety and depressed mood: Secondary | ICD-10-CM | POA: Diagnosis not present

## 2023-07-11 NOTE — Progress Notes (Signed)
Diagnosis: F43.23 Time: 2:01 pm-2:58 pm CPT: 41324M-01  Antonea was seen remotely using secure video conferencing. Ericah joined from a parked car at [employer] and the therapist was in her office at the time of the appointment. Client is aware of risks of telehealth and consented to a virtual visit. She reported stress related to inconsistencies in her child's sleep schedule. Therapist offered an opportunity to process and suggested strategies to explore (e.g., a visual schedule, a reinforcement plan). She is scheduled to be seen again in one month, and therapist will reach out as cancellations happen.  Treatment Plan Client Abilities/Strengths  Clarissia is seeking CBT to help her cope with her daughter's ASD diagnosis and pursue appropriate treatments. She shared that she is also interested in regaining motivation to achieve previous levels of discipline in her lifestyle.  Client Treatment Preferences  Aziya prefers remote appointments that occur while her children are in school, between 9:30am-12:00am  Client Statement of Needs  Shamyia is seeking CBT to help her cope with and navigate her daughter's diagnosis, as well as establishing and maintaining a self-care routine  Treatment Level  Monthly  Symptoms  Anxiety: sleep irregularities, excessive worry (Status: maintained). Depression: frequent headaches, weight gain, fatigue, sleep difficulties (Status: maintained).  Problems Addressed  New Description, New Description  Goals 1. Bailley has struggled to maintain self care in recent years Objective Royal would like to increase her understanding of her daughter in order to better address her needs Target Date: 2023-08-17 Frequency: Monthly  Progress: 40 Modality: individual  Related Interventions Therapist will incorporate manualized treatments including Parent Training for Disruptive Behaviors in ASD  Therapist will provide referrals for additional resources as appropriate   Objective Young would like to establish a consistent self care routine  Target Date: 2023-08-17 Frequency: Monthly  Progress: 25 Modality: individual  Related Interventions Therapist will incorporate CBT based strategies to help Nahomi identify and disengage from maladaptive thoughts and behaviors Alorah will be provided opportunities to process experiences in session 2. Nakia would like to increase self-accountability toward accomplishing set tasks Target date: 09/15/2023 Diagnosis Axis none 309.28 (Adjustment disorder with mixed anxiety and depressed mood) - Open - [Signifier: n/a]    Conditions For Discharge Achievement of treatment goals and objectives   Chrissie Noa, PhD               Chrissie Noa, PhD            Chrissie Noa, PhD

## 2023-07-16 ENCOUNTER — Ambulatory Visit: Payer: 59 | Admitting: Clinical

## 2023-08-07 ENCOUNTER — Other Ambulatory Visit (HOSPITAL_COMMUNITY): Payer: Self-pay

## 2023-08-07 ENCOUNTER — Other Ambulatory Visit: Payer: Self-pay | Admitting: Family Medicine

## 2023-08-07 ENCOUNTER — Other Ambulatory Visit: Payer: Self-pay

## 2023-08-07 ENCOUNTER — Other Ambulatory Visit (INDEPENDENT_AMBULATORY_CARE_PROVIDER_SITE_OTHER): Payer: Self-pay | Admitting: Family Medicine

## 2023-08-07 DIAGNOSIS — F32A Depression, unspecified: Secondary | ICD-10-CM

## 2023-08-07 MED ORDER — ESCITALOPRAM OXALATE 20 MG PO TABS
20.0000 mg | ORAL_TABLET | Freq: Every day | ORAL | 1 refills | Status: DC
Start: 2023-08-07 — End: 2024-04-23
  Filled 2023-08-07 – 2023-09-25 (×2): qty 90, 90d supply, fill #0
  Filled 2023-12-24: qty 90, 90d supply, fill #1

## 2023-08-07 MED ORDER — LOSARTAN POTASSIUM 50 MG PO TABS
50.0000 mg | ORAL_TABLET | Freq: Every day | ORAL | 1 refills | Status: DC
  Filled 2023-08-07: qty 90, 90d supply, fill #0
  Filled 2023-09-17 – 2023-12-24 (×3): qty 90, 90d supply, fill #1

## 2023-08-08 ENCOUNTER — Other Ambulatory Visit (HOSPITAL_COMMUNITY): Payer: Self-pay

## 2023-08-08 ENCOUNTER — Other Ambulatory Visit: Payer: Self-pay

## 2023-08-08 MED ORDER — SPIRONOLACTONE 50 MG PO TABS
50.0000 mg | ORAL_TABLET | Freq: Every day | ORAL | 1 refills | Status: DC
  Filled 2023-08-08: qty 90, 90d supply, fill #0
  Filled 2023-09-21: qty 90, 90d supply, fill #1

## 2023-08-08 MED ORDER — LISDEXAMFETAMINE DIMESYLATE 20 MG PO CAPS
20.0000 mg | ORAL_CAPSULE | Freq: Every evening | ORAL | 0 refills | Status: DC
Start: 2023-08-08 — End: 2023-10-14
  Filled 2023-08-08: qty 30, 30d supply, fill #0

## 2023-08-14 ENCOUNTER — Ambulatory Visit (INDEPENDENT_AMBULATORY_CARE_PROVIDER_SITE_OTHER): Payer: 59 | Admitting: Clinical

## 2023-08-14 DIAGNOSIS — F4323 Adjustment disorder with mixed anxiety and depressed mood: Secondary | ICD-10-CM | POA: Diagnosis not present

## 2023-08-14 NOTE — Progress Notes (Signed)
Diagnosis: F43.23 Time: 2:01 pm-2:58 pm CPT: 09811B-14  Cynthia [name] was seen remotely using secure video conferencing. Cynthia [name] joined from her home and the therapist was in her office at the time of the appointment. Client is aware of risks of telehealth and consented to a virtual visit. Session focused on processing recent events in her children's education. Therapist offered validation and support, and suggested several resources. She is scheduled to be seen again in three weeks.    Treatment Plan Client Abilities/Strengths  Cynthia [name] is seeking CBT to help her cope with her daughter's ASD diagnosis and pursue appropriate treatments. She shared that she is also interested in regaining motivation to achieve previous levels of discipline in her lifestyle.  Client Treatment Preferences  Cynthia [name] prefers remote appointments that occur while her children are in school, between 9:30am-12:00am  Client Statement of Needs  Cynthia [name] is seeking CBT to help her cope with and navigate her daughter's diagnosis, as well as establishing and maintaining a self-care routine  Treatment Level  Monthly  Symptoms  Anxiety: sleep irregularities, excessive worry (Status: maintained). Depression: frequent headaches, weight gain, fatigue, sleep difficulties (Status: maintained).  Problems Addressed  New Description, New Description  Goals 1. Cynthia [name] has struggled to maintain self care in recent years Objective Cynthia [name] would like to increase her understanding of her daughter in order to better address her needs Target Date: 2023-08-17 Frequency: Monthly  Progress: 40 Modality: individual  Related Interventions Therapist will incorporate manualized treatments including Parent Training for Disruptive Behaviors in ASD  Therapist will provide referrals for additional resources as appropriate  Objective Cynthia [name] would like to establish a consistent self care routine  Target Date: 2023-08-17 Frequency: Monthly  Progress: 25  Modality: individual  Related Interventions Therapist will incorporate CBT based strategies to help Cynthia [name] identify and disengage from maladaptive thoughts and behaviors Cynthia [name] will be provided opportunities to process experiences in session 2. Cynthia [name] would like to increase self-accountability toward accomplishing set tasks Target date: 09/15/2023 Diagnosis Axis none 309.28 (Adjustment disorder with mixed anxiety and depressed mood) - Open - [Signifier: n/a]    Conditions For Discharge Achievement of treatment goals and objectives   Cynthia Noa, PhD               Cynthia Noa, PhD            Cynthia Noa, PhD               Cynthia Noa, PhD

## 2023-08-15 DIAGNOSIS — E559 Vitamin D deficiency, unspecified: Secondary | ICD-10-CM | POA: Diagnosis not present

## 2023-08-15 DIAGNOSIS — F908 Attention-deficit hyperactivity disorder, other type: Secondary | ICD-10-CM | POA: Diagnosis not present

## 2023-08-15 DIAGNOSIS — G43809 Other migraine, not intractable, without status migrainosus: Secondary | ICD-10-CM | POA: Diagnosis not present

## 2023-09-09 ENCOUNTER — Ambulatory Visit (INDEPENDENT_AMBULATORY_CARE_PROVIDER_SITE_OTHER): Payer: 59 | Admitting: Clinical

## 2023-09-09 DIAGNOSIS — F4323 Adjustment disorder with mixed anxiety and depressed mood: Secondary | ICD-10-CM

## 2023-09-09 NOTE — Progress Notes (Signed)
Diagnosis: F43.23 Time: 2:01 pm-2:58 pm CPT: 66063K-16  Shamicka was seen remotely using secure video conferencing. Kalynn joined from her home and the therapist was in her office at the time of the appointment. Client is aware of risks of telehealth and consented to a virtual visit. Session reviewing and updating Destyni's treatment plan. She provided verbal input and consent to all goals and interventions. Hadiyah reflected upon her tendency to compartmentalize and shut down her emotional experience, and queried whether this may be a trauma response. Therapist suggested continuing to explore this in session. She is scheduled to be seen again in three weeks.   Treatment Plan Client Abilities/Strengths  Judie is seeking CBT to help her cope with her daughter's ASD diagnosis and pursue appropriate treatments. She shared that she is also interested in regaining motivation to achieve previous levels of discipline in her lifestyle.  Client Treatment Preferences  Adilynne prefers remote appointments that occur while her children are in school, between 9:30am-12:00am  Client Statement of Needs  Izabell is seeking CBT to help her cope with and navigate her daughter's diagnosis, as well as establishing and maintaining a self-care routine  Treatment Level  Monthly  Symptoms  Anxiety: sleep irregularities, excessive worry (Status: maintained). Depression: frequent headaches, weight gain, fatigue, sleep difficulties (Status: maintained).  Problems Addressed  New Description, New Description  Goals 1. Matsue has struggled to maintain self care in recent years Objective Chioma would like to increase her understanding of her daughters in order to better address their needs Target Date: 2023-09-09 Frequency: Monthly  Progress: 70% one daughter, 25% other daughter Modality: individual  Related Interventions Therapist will incorporate manualized treatments including Parent Training for Disruptive Behaviors in  ASD  Therapist will provide referrals for additional resources as appropriate  Objective Leza would like to establish a consistent self care routine  Target Date: 2024-09-08 Frequency: Monthly  Progress: 30 Modality: individual  Related Interventions Therapist will incorporate CBT based strategies to help Addalee identify and disengage from maladaptive thoughts and behaviors Garnell will be provided opportunities to process experiences in session 2. Vernadine would like to increase self-accountability toward accomplishing set tasks Target date: 09/08/2024 Progress: 30%  3. Seidy will process traumatic experiences from her past and consider how these experiences may continue to impact her currently.  Diagnosis Axis none 309.28 (Adjustment disorder with mixed anxiety and depressed mood) - Open - [Signifier: n/a]    Conditions For Discharge Achievement of treatment goals and objectives  ntake Presenting Problem Alima presented seeking CBT to cope with her daughter's ASD diagnosis and navigate next steps in her care.  Symptoms excessive worry, weight gain, sleep irregularities, fatigue History of Problem  Sherelle's 14-year-old daughter Arna Medici was diagnosed with ASD in spring of 2022, and her twin daughter Renard Hamper was diagnosed the following year. She shared that it has been challenging to coordinate appropriate care and services for them.  Recent Trigger  Arrianna's daughter was diagnosed with ASD in spring of 2022.  Marital and Family Information  Present family concerns/problems: None reported, although Fernanda would like more 1:1 time with her husband  Strengths/resources in the family/friends: Arlayne described a "great" relationship with her husband  Marital/sexual history patterns: Delora has been married for 10 years and has been in a relationship with her husband for 23 years  Family of Origin  Problems in family of origin: Kikue shared that she has very few memories of her child until she  was in the end of elementary school. Her family moved  to the [place] from [place] when she was 42 years of age. She shared that she was cared for primarily by a nanny in early childhood, as is typical of [place] culture, and her father traveled frequently for work.  Family background / ethnic factors: Vernell is from [place]  No needs/concerns related to ethnicity reported when asked: No  Education/Vocation  Interpersonal concerns/problems: Ethne shared that she feels isolated since having children and social restrictions during the COVID19 pandemic. She moved to [facility] for her husband's job in 2014 and she shared that she does not have a significant support system in the area outside of two close friends who live in [place].  Personal strengths: Zenya does not hold on to grudges and described herself as easy going.  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  Leisure Activities/Daily Functioning  gave up  Legal Status  No Legal Problems  Substance use/abuse/dependence  unspecified  Comments: Weight gain-Harbour has participated the [facility] weight management program  Religion/Spirituality  Not Reported  Other  General Behavior: cooperative  Attire: appropriate  Gait: Not observed-telehealth  Motor Activity: normal  Stream of Thought - Productivity: spontaneous  Stream of thought - Progression: normal  Stream of thought - Language: normal  Emotional tone and reactions - Mood: normal  Emotional tone and reactions - Affect: appropriate  Mental trend/Content of thoughts - Perception: normal  Mental trend/Content of thoughts - Orientation: normal  Mental trend/Content of thoughts - Memory: normal  Mental trend/Content of thoughts - General knowledge: consistent with education  Insight: good  Judgment: good  Intelligence: high                 Chrissie Noa, PhD               Chrissie Noa, PhD

## 2023-09-17 ENCOUNTER — Other Ambulatory Visit: Payer: Self-pay | Admitting: Family Medicine

## 2023-09-17 ENCOUNTER — Other Ambulatory Visit: Payer: Self-pay

## 2023-09-17 ENCOUNTER — Other Ambulatory Visit (HOSPITAL_COMMUNITY): Payer: Self-pay

## 2023-09-17 ENCOUNTER — Other Ambulatory Visit (INDEPENDENT_AMBULATORY_CARE_PROVIDER_SITE_OTHER): Payer: Self-pay

## 2023-09-23 ENCOUNTER — Other Ambulatory Visit (HOSPITAL_COMMUNITY): Payer: Self-pay

## 2023-09-25 ENCOUNTER — Other Ambulatory Visit (INDEPENDENT_AMBULATORY_CARE_PROVIDER_SITE_OTHER): Payer: Self-pay

## 2023-09-25 ENCOUNTER — Other Ambulatory Visit (HOSPITAL_COMMUNITY): Payer: Self-pay

## 2023-09-25 MED ORDER — SPIRONOLACTONE 50 MG PO TABS
50.0000 mg | ORAL_TABLET | Freq: Every day | ORAL | 1 refills | Status: DC
  Filled 2023-09-25 – 2023-11-01 (×2): qty 90, 90d supply, fill #0
  Filled 2024-02-18: qty 90, 90d supply, fill #1

## 2023-09-25 MED ORDER — LOSARTAN POTASSIUM 25 MG PO TABS
25.0000 mg | ORAL_TABLET | Freq: Every day | ORAL | 1 refills | Status: DC
  Filled 2023-09-25: qty 90, 90d supply, fill #0

## 2023-09-25 MED ORDER — BACLOFEN 10 MG PO TABS
10.0000 mg | ORAL_TABLET | Freq: Every evening | ORAL | 0 refills | Status: DC | PRN
  Filled 2023-09-25: qty 90, 90d supply, fill #0

## 2023-09-26 ENCOUNTER — Other Ambulatory Visit (HOSPITAL_COMMUNITY): Payer: Self-pay

## 2023-09-30 ENCOUNTER — Ambulatory Visit (INDEPENDENT_AMBULATORY_CARE_PROVIDER_SITE_OTHER): Payer: 59 | Admitting: Clinical

## 2023-09-30 DIAGNOSIS — F4323 Adjustment disorder with mixed anxiety and depressed mood: Secondary | ICD-10-CM

## 2023-09-30 NOTE — Progress Notes (Signed)
Diagnosis: F43.23 Time: 10:00 am-10:58 am CPT: 16109U-04  Cynthia [name] was seen remotely using secure video conferencing. Cynthia [name] joined from a parked car outside of [facility] and the therapist was in her office at the time of the appointment. Client is aware of risks of telehealth and consented to a virtual visit. Session focused on growing concerns she has experienced that her daughter may suffer from an undiagnosed physiological condition. The family had undertaken steps to have her seen at [facility]. Therapist offered validation and support, exploring these concerns with Cynthia [name]. Cynthia [name] also shared that she has felt excessively fatigued of late, and indicated a plan to ask for her thyroid to be checked at her next physical. In terms of self care, she indicated a plan to take a pottery class with friends. She is scheduled to be seen again in three weeks.   Treatment Plan Client Abilities/Strengths  Cynthia [name] is seeking CBT to help her cope with her daughter's ASD diagnosis and pursue appropriate treatments. She shared that she is also interested in regaining motivation to achieve previous levels of discipline in her lifestyle.  Client Treatment Preferences  Cynthia [name] prefers remote appointments that occur while her children are in school, between 9:30am-12:00am  Client Statement of Needs  Cynthia [name] is seeking CBT to help her cope with and navigate her daughter's diagnosis, as well as establishing and maintaining a self-care routine  Treatment Level  Monthly  Symptoms  Anxiety: sleep irregularities, excessive worry (Status: maintained). Depression: frequent headaches, weight gain, fatigue, sleep difficulties (Status: maintained).  Problems Addressed  New Description, New Description  Goals 1. Kessa has struggled to maintain self care in recent years Objective Cynthia [name] would like to increase her understanding of her daughters in order to better address their needs Target Date: 2024-09-08  Frequency: Monthly  Progress: 70% one daughter, 25% other daughter Modality: individual  Related Interventions Therapist will incorporate manualized treatments including Parent Training for Disruptive Behaviors in ASD  Therapist will provide referrals for additional resources as appropriate  Objective Cynthia [name] would like to establish a consistent self care routine  Target Date: 2024-09-08 Frequency: Monthly  Progress: 30 Modality: individual  Related Interventions Therapist will incorporate CBT based strategies to help Cynthia [name] identify and disengage from maladaptive thoughts and behaviors Cynthia [name] will be provided opportunities to process experiences in session 2. Laprecious would like to increase self-accountability toward accomplishing set tasks Target date: 09/08/2024 Progress: 30%  3. Cynthia [name] will process traumatic experiences from her past and consider how these experiences may continue to impact her currently.  Diagnosis Axis none 309.28 (Adjustment disorder with mixed anxiety and depressed mood) - Open - [Signifier: n/a]    Conditions For Discharge Achievement of treatment goals and objectives  ntake Presenting Problem Cynthia [name] presented seeking CBT to cope with her daughter's ASD diagnosis and navigate next steps in her care.  Symptoms excessive worry, weight gain, sleep irregularities, fatigue History of Problem  Cynthia [name]'s 50-year-old daughter Cynthia [name] was diagnosed with ASD in spring of 2022, and her twin daughter Cynthia [name] was diagnosed the following year. She shared that it has been challenging to coordinate appropriate care and services for them.  Recent Trigger  Cynthia [name]'s daughter was diagnosed with ASD in spring of 2022.  Marital and Family Information  Present family concerns/problems: None reported, although Cynthia [name] would like more 1:1 time with her husband  Strengths/resources in the family/friends: Cynthia [name] described a "great" relationship with her husband  Marital/sexual history patterns:  Cynthia [name] has been married for 10 years and has been in  a relationship with her husband for 23 years  Family of Origin  Problems in family of origin: Cynthia [name] shared that she has very few memories of her child until she was in the end of elementary school. Her family moved to the [place] from [place] when she was 41 years of age. She shared that she was cared for primarily by a nanny in early childhood, as is typical of [place] culture, and her father traveled frequently for work.  Family background / ethnic factors: Cynthia [name] is from [place]  No needs/concerns related to ethnicity reported when asked: No  Education/Vocation  Interpersonal concerns/problems: Cynthia [name] shared that she feels isolated since having children and social restrictions during the COVID19 pandemic. She moved to [facility] for her husband's job in 2014 and she shared that she does not have a significant support system in the area outside of two close friends who live in [place].  Personal strengths: Cynthia [name] does not hold on to grudges and described herself as easy going.  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  Leisure Activities/Daily Functioning  gave up  Legal Status  No Legal Problems  Substance use/abuse/dependence  unspecified  Comments: Weight gain-Cynthia [name] has participated the [facility] weight management program  Religion/Spirituality  Not Reported  Other  General Behavior: cooperative  Attire: appropriate  Gait: Not observed-telehealth  Motor Activity: normal  Stream of Thought - Productivity: spontaneous  Stream of thought - Progression: normal  Stream of thought - Language: normal  Emotional tone and reactions - Mood: normal  Emotional tone and reactions - Affect: appropriate  Mental trend/Content of thoughts - Perception: normal  Mental trend/Content of  thoughts - Orientation: normal  Mental trend/Content of thoughts - Memory: normal  Mental trend/Content of thoughts - General knowledge: consistent with education  Insight: good  Judgment: good  Intelligence: high                Chrissie Noa, PhD               Chrissie Noa, PhD               Chrissie Noa, PhD

## 2023-10-11 ENCOUNTER — Other Ambulatory Visit (HOSPITAL_COMMUNITY): Payer: Self-pay

## 2023-10-14 ENCOUNTER — Other Ambulatory Visit (HOSPITAL_COMMUNITY): Payer: Self-pay

## 2023-10-14 ENCOUNTER — Encounter (HOSPITAL_COMMUNITY): Payer: Self-pay

## 2023-10-14 MED ORDER — LISDEXAMFETAMINE DIMESYLATE 20 MG PO CAPS
20.0000 mg | ORAL_CAPSULE | Freq: Every day | ORAL | 0 refills | Status: DC
Start: 2023-10-14 — End: 2023-11-04
  Filled 2023-10-14: qty 30, 30d supply, fill #0

## 2023-10-14 MED ORDER — LISDEXAMFETAMINE DIMESYLATE 40 MG PO CAPS
40.0000 mg | ORAL_CAPSULE | Freq: Every morning | ORAL | 0 refills | Status: DC
Start: 2023-10-14 — End: 2023-11-30
  Filled 2023-10-14: qty 30, 30d supply, fill #0

## 2023-10-15 ENCOUNTER — Ambulatory Visit: Payer: 59 | Admitting: Clinical

## 2023-10-15 DIAGNOSIS — F4323 Adjustment disorder with mixed anxiety and depressed mood: Secondary | ICD-10-CM

## 2023-10-15 NOTE — Progress Notes (Signed)
Diagnosis: F43.23 Time: 10:00 am-10:58 am CPT: 16109U-04  Prue was seen remotely using secure video conferencing. Ysidra joined from her homeand the therapist was in her office at the time of the appointment. Client is aware of risks of telehealth and consented to a virtual visit. Session focused on stress Loredana was experiencing about the election, as well as steps she has taken toward better understanding her daughter's diagnosis. Therapist suggested coping strategies and offered validation and support. She is scheduled to be seen again in three weeks.   Treatment Plan Client Abilities/Strengths  Apple is seeking CBT to help her cope with her daughter's ASD diagnosis and pursue appropriate treatments. She shared that she is also interested in regaining motivation to achieve previous levels of discipline in her lifestyle.  Client Treatment Preferences  Analysse prefers remote appointments that occur while her children are in school, between 9:30am-12:00am  Client Statement of Needs  Tennessee is seeking CBT to help her cope with and navigate her daughter's diagnosis, as well as establishing and maintaining a self-care routine  Treatment Level  Monthly  Symptoms  Anxiety: sleep irregularities, excessive worry (Status: maintained). Depression: frequent headaches, weight gain, fatigue, sleep difficulties (Status: maintained).  Problems Addressed  New Description, New Description  Goals 1. Martia has struggled to maintain self care in recent years Objective Kristyne would like to increase her understanding of her daughters in order to better address their needs Target Date: 2024-09-08 Frequency: Monthly  Progress: 70% one daughter, 25% other daughter Modality: individual  Related Interventions Therapist will incorporate manualized treatments including Parent Training for Disruptive Behaviors in ASD  Therapist will provide referrals for additional resources as appropriate  Objective Jacia  would like to establish a consistent self care routine  Target Date: 2024-09-08 Frequency: Monthly  Progress: 30 Modality: individual  Related Interventions Therapist will incorporate CBT based strategies to help Nicolemarie identify and disengage from maladaptive thoughts and behaviors Lanette will be provided opportunities to process experiences in session 2. Roselle would like to increase self-accountability toward accomplishing set tasks Target date: 09/08/2024 Progress: 30%  3. Kyah will process traumatic experiences from her past and consider how these experiences may continue to impact her currently.  Diagnosis Axis none 309.28 (Adjustment disorder with mixed anxiety and depressed mood) - Open - [Signifier: n/a]    Conditions For Discharge Achievement of treatment goals and objectives  ntake Presenting Problem Blair presented seeking CBT to cope with her daughter's ASD diagnosis and navigate next steps in her care.  Symptoms excessive worry, weight gain, sleep irregularities, fatigue History of Problem  Rosaura's 45-year-old daughter Arna Medici was diagnosed with ASD in spring of 2022, and her twin daughter Renard Hamper was diagnosed the following year. She shared that it has been challenging to coordinate appropriate care and services for them.  Recent Trigger  Rita's daughter was diagnosed with ASD in spring of 2022.  Marital and Family Information  Present family concerns/problems: None reported, although Teylor would like more 1:1 time with her husband  Strengths/resources in the family/friends: Madysin described a "great" relationship with her husband  Marital/sexual history patterns: Brittaney has been married for 10 years and has been in a relationship with her husband for 23 years  Family of Origin  Problems in family of origin: Virgilia shared that she has very few memories of her child until she was in the end of elementary school. Her family moved to the [place] from [place] when she was 42 years  of age. She shared that she was  cared for primarily by a nanny in early childhood, as is typical of [place] culture, and her father traveled frequently for work.  Family background / ethnic factors: Ashonti is from [place]  No needs/concerns related to ethnicity reported when asked: No  Education/Vocation  Interpersonal concerns/problems: Jaklyn shared that she feels isolated since having children and social restrictions during the COVID19 pandemic. She moved to [facility] for her husband's job in 2014 and she shared that she does not have a significant support system in the area outside of two close friends who live in [place].  Personal strengths: Simone does not hold on to grudges and described herself as easy going.  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  Leisure Activities/Daily Functioning  gave up  Legal Status  No Legal Problems  Substance use/abuse/dependence  unspecified  Comments: Weight gain-Keanu has participated the [facility] weight management program  Religion/Spirituality  Not Reported  Other  General Behavior: cooperative  Attire: appropriate  Gait: Not observed-telehealth  Motor Activity: normal  Stream of Thought - Productivity: spontaneous  Stream of thought - Progression: normal  Stream of thought - Language: normal  Emotional tone and reactions - Mood: normal  Emotional tone and reactions - Affect: appropriate  Mental trend/Content of thoughts - Perception: normal  Mental trend/Content of thoughts - Orientation: normal  Mental trend/Content of thoughts - Memory: normal  Mental trend/Content of thoughts - General knowledge: consistent with education  Insight: good  Judgment: good  Intelligence: high      Chrissie Noa, PhD               Chrissie Noa, PhD

## 2023-10-16 ENCOUNTER — Other Ambulatory Visit (HOSPITAL_COMMUNITY): Payer: Self-pay

## 2023-10-31 ENCOUNTER — Ambulatory Visit: Payer: 59 | Admitting: Clinical

## 2023-10-31 DIAGNOSIS — F4323 Adjustment disorder with mixed anxiety and depressed mood: Secondary | ICD-10-CM

## 2023-10-31 NOTE — Progress Notes (Signed)
Diagnosis: F43.23 Time: 10:00 am-10:58 am CPT: [id number]  Cynthia [name] was seen remotely using secure video conferencing. Cynthia [name] joined from a parked car in the [employer] parking lot in [place], and the therapist was in her office at the time of the appointment. Client is aware of risks of telehealth and consented to a virtual visit. Session focused on developments and concerns related to her daughters' health, as well as her relationship to her daughter and parallels Cynthia [name] sees to her own relationship with her mother. Therapist offered validation and support, pointing out Cynthia [name]'s strengths as a parent. She is scheduled to be seen again in two weeks.   Treatment Plan Client Abilities/Strengths  Cynthia [name] is seeking CBT to help her cope with her daughter's ASD diagnosis and pursue appropriate treatments. She shared that she is also interested in regaining motivation to achieve previous levels of discipline in her lifestyle.  Client Treatment Preferences  Cynthia [name] prefers remote appointments that occur while her children are in school, between 9:30am-12:00am  Client Statement of Needs  Cynthia [name] is seeking CBT to help her cope with and navigate her daughter's diagnosis, as well as establishing and maintaining a self-care routine  Treatment Level  Monthly  Symptoms  Anxiety: sleep irregularities, excessive worry (Status: maintained). Depression: frequent headaches, weight gain, fatigue, sleep difficulties (Status: maintained).  Problems Addressed  New Description, New Description  Goals 1. Cynthia [name] has struggled to maintain self care in recent years Objective Cynthia [name] would like to increase her understanding of her daughters in order to better address their needs Target Date: 2024-09-08 Frequency: Monthly  Progress: 70% one daughter, 25% other daughter Modality: individual  Related Interventions Therapist will incorporate manualized treatments including Parent Training for Disruptive Behaviors in  ASD  Therapist will provide referrals for additional resources as appropriate  Objective Cynthia [name] would like to establish a consistent self care routine  Target Date: 2024-09-08 Frequency: Monthly  Progress: 30 Modality: individual  Related Interventions Therapist will incorporate CBT based strategies to help Cynthia [name] identify and disengage from maladaptive thoughts and behaviors Cynthia [name] will be provided opportunities to process experiences in session 2. Cynthia [name] would like to increase self-accountability toward accomplishing set tasks Target date: 09/08/2024 Progress: 30%  3. Cynthia [name] will process traumatic experiences from her past and consider how these experiences may continue to impact her currently.  Diagnosis Axis none 309.28 (Adjustment disorder with mixed anxiety and depressed mood) - Open - [Signifier: n/a]    Conditions For Discharge Achievement of treatment goals and objectives  ntake Presenting Problem Cynthia [name] presented seeking CBT to cope with her daughter's ASD diagnosis and navigate next steps in her care.  Symptoms excessive worry, weight gain, sleep irregularities, fatigue History of Problem  Pinki's 44-year-old daughter Cynthia [name] was diagnosed with ASD in spring of 2022, and her twin daughter Cynthia [name] was diagnosed the following year. She shared that it has been challenging to coordinate appropriate care and services for them.  Recent Trigger  Cynthia [name]'s daughter was diagnosed with ASD in spring of 2022.  Marital and Family Information  Present family concerns/problems: None reported, although Cynthia [name] would like more 1:1 time with her husband  Strengths/resources in the family/friends: Cynthia [name] described a "great" relationship with her husband  Marital/sexual history patterns: Cynthia [name] has been married for 10 years and has been in a relationship with her husband for 23 years  Family of Origin  Problems in family of origin: Cynthia [name] shared that she has very few memories of her child until she  was in the end of elementary  school. Her family moved to the [place] from [place] when she was 42 years of age. She shared that she was cared for primarily by a nanny in early childhood, as is typical of [place] culture, and her father traveled frequently for work.  Family background / ethnic factors: Cynthia [name] is from [place]  No needs/concerns related to ethnicity reported when asked: No  Education/Vocation  Interpersonal concerns/problems: Cynthia [name] shared that she feels isolated since having children and social restrictions during the COVID19 pandemic. She moved to [facility] for her husband's job in 2014 and she shared that she does not have a significant support system in the area outside of two close friends who live in [place].  Personal strengths: Cynthia [name] does not hold on to grudges and described herself as easy going.  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  Leisure Activities/Daily Functioning  gave up  Legal Status  No Legal Problems  Substance use/abuse/dependence  unspecified  Comments: Weight gain-Kalkidan has participated the [facility] weight management program  Religion/Spirituality  Not Reported  Other  General Behavior: cooperative  Attire: appropriate  Gait: Not observed-telehealth  Motor Activity: normal  Stream of Thought - Productivity: spontaneous  Stream of thought - Progression: normal  Stream of thought - Language: normal  Emotional tone and reactions - Mood: normal  Emotional tone and reactions - Affect: appropriate  Mental trend/Content of thoughts - Perception: normal  Mental trend/Content of thoughts - Orientation: normal  Mental trend/Content of thoughts - Memory: normal  Mental trend/Content of thoughts - General knowledge: consistent with education  Insight: good  Judgment: good  Intelligence: high       Chrissie Noa, PhD               Chrissie Noa, PhD               Chrissie Noa, PhD

## 2023-11-01 ENCOUNTER — Other Ambulatory Visit: Payer: Self-pay

## 2023-11-01 ENCOUNTER — Other Ambulatory Visit (HOSPITAL_COMMUNITY): Payer: Self-pay

## 2023-11-04 ENCOUNTER — Other Ambulatory Visit (HOSPITAL_COMMUNITY): Payer: Self-pay

## 2023-11-04 MED ORDER — LISDEXAMFETAMINE DIMESYLATE 20 MG PO CAPS
20.0000 mg | ORAL_CAPSULE | Freq: Every day | ORAL | 0 refills | Status: DC
Start: 2023-11-04 — End: 2024-03-12
  Filled 2023-11-04 – 2024-02-18 (×2): qty 30, 30d supply, fill #0

## 2023-11-14 ENCOUNTER — Encounter: Payer: 59 | Admitting: Family Medicine

## 2023-11-20 ENCOUNTER — Ambulatory Visit: Payer: 59 | Admitting: Clinical

## 2023-11-26 DIAGNOSIS — R5383 Other fatigue: Secondary | ICD-10-CM | POA: Diagnosis not present

## 2023-11-26 DIAGNOSIS — R7301 Impaired fasting glucose: Secondary | ICD-10-CM | POA: Diagnosis not present

## 2023-11-26 DIAGNOSIS — Z862 Personal history of diseases of the blood and blood-forming organs and certain disorders involving the immune mechanism: Secondary | ICD-10-CM | POA: Diagnosis not present

## 2023-11-26 DIAGNOSIS — Z6833 Body mass index (BMI) 33.0-33.9, adult: Secondary | ICD-10-CM | POA: Diagnosis not present

## 2023-11-26 DIAGNOSIS — Z79899 Other long term (current) drug therapy: Secondary | ICD-10-CM | POA: Diagnosis not present

## 2023-11-26 DIAGNOSIS — E66811 Obesity, class 1: Secondary | ICD-10-CM | POA: Diagnosis not present

## 2023-11-28 ENCOUNTER — Other Ambulatory Visit (HOSPITAL_COMMUNITY): Payer: Self-pay

## 2023-11-28 DIAGNOSIS — Z1151 Encounter for screening for human papillomavirus (HPV): Secondary | ICD-10-CM | POA: Diagnosis not present

## 2023-11-28 DIAGNOSIS — Z124 Encounter for screening for malignant neoplasm of cervix: Secondary | ICD-10-CM | POA: Diagnosis not present

## 2023-11-28 DIAGNOSIS — Z01411 Encounter for gynecological examination (general) (routine) with abnormal findings: Secondary | ICD-10-CM | POA: Diagnosis not present

## 2023-11-28 DIAGNOSIS — Z30011 Encounter for initial prescription of contraceptive pills: Secondary | ICD-10-CM | POA: Diagnosis not present

## 2023-11-28 DIAGNOSIS — Z130 Encounter for screening for diseases of the blood and blood-forming organs and certain disorders involving the immune mechanism: Secondary | ICD-10-CM | POA: Diagnosis not present

## 2023-11-28 DIAGNOSIS — N898 Other specified noninflammatory disorders of vagina: Secondary | ICD-10-CM | POA: Diagnosis not present

## 2023-11-28 DIAGNOSIS — N939 Abnormal uterine and vaginal bleeding, unspecified: Secondary | ICD-10-CM | POA: Diagnosis not present

## 2023-11-28 DIAGNOSIS — Z1389 Encounter for screening for other disorder: Secondary | ICD-10-CM | POA: Diagnosis not present

## 2023-11-30 ENCOUNTER — Other Ambulatory Visit (HOSPITAL_COMMUNITY): Payer: Self-pay

## 2023-11-30 MED ORDER — LISDEXAMFETAMINE DIMESYLATE 40 MG PO CAPS
40.0000 mg | ORAL_CAPSULE | Freq: Every morning | ORAL | 0 refills | Status: DC
  Filled 2023-11-30: qty 30, 30d supply, fill #0

## 2023-12-02 ENCOUNTER — Other Ambulatory Visit (HOSPITAL_COMMUNITY): Payer: Self-pay

## 2023-12-02 MED ORDER — NORETHINDRONE ACET-ETHINYL EST 1-20 MG-MCG PO TABS
1.0000 | ORAL_TABLET | Freq: Every day | ORAL | 1 refills | Status: AC
  Filled 2023-12-02: qty 63, 63d supply, fill #0
  Filled 2024-04-23 – 2024-05-06 (×2): qty 63, 63d supply, fill #1

## 2023-12-06 ENCOUNTER — Other Ambulatory Visit (HOSPITAL_COMMUNITY): Payer: Self-pay

## 2023-12-16 ENCOUNTER — Ambulatory Visit (INDEPENDENT_AMBULATORY_CARE_PROVIDER_SITE_OTHER): Payer: 59 | Admitting: Family Medicine

## 2023-12-16 ENCOUNTER — Encounter: Payer: Self-pay | Admitting: Family Medicine

## 2023-12-16 VITALS — BP 118/74 | HR 77 | Temp 98.7°F | Ht 62.0 in | Wt 186.5 lb

## 2023-12-16 DIAGNOSIS — E559 Vitamin D deficiency, unspecified: Secondary | ICD-10-CM

## 2023-12-16 DIAGNOSIS — Z23 Encounter for immunization: Secondary | ICD-10-CM | POA: Diagnosis not present

## 2023-12-16 DIAGNOSIS — Z1159 Encounter for screening for other viral diseases: Secondary | ICD-10-CM | POA: Diagnosis not present

## 2023-12-16 DIAGNOSIS — Z0000 Encounter for general adult medical examination without abnormal findings: Secondary | ICD-10-CM | POA: Diagnosis not present

## 2023-12-16 DIAGNOSIS — I10 Essential (primary) hypertension: Secondary | ICD-10-CM | POA: Diagnosis not present

## 2023-12-16 NOTE — Patient Instructions (Signed)
 Follow up in 6 months to recheck BP We'll notify you of your lab results and make any changes if needed Continue to work on healthy diet and regular exercise- you can do it! Make sure you are eating regularly throughout the day Call with any questions or concerns Stay Safe!  Stay Healthy! Happy New Year!!

## 2023-12-16 NOTE — Assessment & Plan Note (Signed)
 Pt's PE WNL w/ exception of BMI.  UTD on pap, Tdap.  Flu shot given today.  Check labs.  Anticipatory guidance provided.

## 2023-12-16 NOTE — Progress Notes (Signed)
   Subjective:    Patient ID: Cynthia [name], female    DOB: December 18, 1980, 43 y.o.   MRN: [id number]  HPI CPE- UTD on pap, mammo, Tdap.  Will do flu today  Patient Care Team    Relationship Specialty Notifications Start End  Mahlon Comer BRAVO, MD PCP - General Family Medicine  04/17/23   Mahlon Comer BRAVO, MD  Family Medicine  06/21/16    Comment: Lavell Pate, Burnard DEL, MD Consulting Physician Obstetrics and Gynecology  09/28/20   [facility]    12/16/23     Health Maintenance  Topic Date Due   Hepatitis C Screening  Never done   Cervical Cancer Screening (HPV/Pap Cotest)  01/16/2022   HPV VACCINES (3 - 3-dose SCDM series) 10/31/2022   INFLUENZA VACCINE  07/11/2023   COVID-19 Vaccine (4 - 2024-25 season) 08/11/2023   DTaP/Tdap/Td (3 - Td or Tdap) 05/22/2027   HIV Screening  Completed      Review of Systems Patient reports no vision/ hearing changes, adenopathy, fever, weight change,  persistant/recurrent hoarseness , swallowing issues, chest pain, palpitations, edema, persistant/recurrent cough, hemoptysis, dyspnea (rest/exertional/paroxysmal nocturnal), gastrointestinal bleeding (melena, rectal bleeding), abdominal pain, significant heartburn, bowel changes, GU symptoms (dysuria, hematuria, incontinence), Gyn symptoms (abnormal  bleeding, pain),  syncope, focal weakness, memory loss, numbness & tingling, skin/hair/nail changes, abnormal bruising or bleeding, anxiety, or depression.   + chills, fatigue, body aches- occurring every few weeks and will last 24-48 hrs.    Objective:   Physical Exam General Appearance:    Alert, cooperative, no distress, appears stated age  Head:    Normocephalic, without obvious abnormality, atraumatic  Eyes:    PERRL, conjunctiva/corneas clear, EOM's intact both eyes  Ears:    Normal TM's and external ear canals, both ears  Nose:   Nares normal, septum midline, mucosa normal, no drainage    or sinus tenderness  Throat:    Lips, mucosa, and tongue normal; teeth and gums normal  Neck:   Supple, symmetrical, trachea midline, no adenopathy;    Thyroid : no enlargement/tenderness/nodules  Back:     Symmetric, no curvature, ROM normal, no CVA tenderness  Lungs:     Clear to auscultation bilaterally, respirations unlabored  Chest Wall:    No tenderness or deformity   Heart:    Regular rate and rhythm, S1 and S2 normal, no murmur, rub   or gallop  Breast Exam:    Deferred to GYN  Abdomen:     Soft, non-tender, bowel sounds active all four quadrants,    no masses, no organomegaly  Genitalia:    Deferred to GYN  Rectal:    Extremities:   Extremities normal, atraumatic, no cyanosis or edema  Pulses:   2+ and symmetric all extremities  Skin:   Skin color, texture, turgor normal, no rashes or lesions  Lymph nodes:   Cervical, supraclavicular, and axillary nodes normal  Neurologic:   CNII-XII intact, normal strength, sensation and reflexes    throughout          Assessment & Plan:

## 2023-12-16 NOTE — Assessment & Plan Note (Signed)
 Check labs and replete prn.

## 2023-12-17 ENCOUNTER — Ambulatory Visit: Payer: 59 | Admitting: Clinical

## 2023-12-17 LAB — HEPATIC FUNCTION PANEL
ALT: 8 U/L (ref 0–35)
AST: 12 U/L (ref 0–37)
Albumin: 3.7 g/dL (ref 3.5–5.2)
Alkaline Phosphatase: 56 U/L (ref 39–117)
Bilirubin, Direct: 0 mg/dL (ref 0.0–0.3)
Total Bilirubin: 0.2 mg/dL (ref 0.2–1.2)
Total Protein: 6.6 g/dL (ref 6.0–8.3)

## 2023-12-17 LAB — LIPID PANEL
Cholesterol: 189 mg/dL (ref 0–200)
HDL: 39.8 mg/dL (ref >39.00)
LDL Cholesterol: 114 mg/dL — ABNORMAL HIGH (ref 0–99)
NonHDL: 149.23
Total CHOL/HDL Ratio: 5
Triglycerides: 174 mg/dL — ABNORMAL HIGH (ref 0.0–149.0)
VLDL: 34.8 mg/dL (ref 0.0–40.0)

## 2023-12-17 LAB — CBC WITH DIFFERENTIAL/PLATELET
Basophils Absolute: 0.1 10*3/uL (ref 0.0–0.1)
Basophils Relative: 1.1 % (ref 0.0–3.0)
Eosinophils Absolute: 0.2 10*3/uL (ref 0.0–0.7)
Eosinophils Relative: 2 % (ref 0.0–5.0)
HCT: 37.2 % (ref 36.0–46.0)
Hemoglobin: 11.9 g/dL — ABNORMAL LOW (ref 12.0–15.0)
Lymphocytes Relative: 24.4 % (ref 12.0–46.0)
Lymphs Abs: 2.1 10*3/uL (ref 0.7–4.0)
MCHC: 31.9 g/dL (ref 30.0–36.0)
MCV: 85.2 fL (ref 78.0–100.0)
Monocytes Absolute: 0.4 10*3/uL (ref 0.1–1.0)
Monocytes Relative: 4.7 % (ref 3.0–12.0)
Neutro Abs: 5.7 10*3/uL (ref 1.4–7.7)
Neutrophils Relative %: 67.8 % (ref 43.0–77.0)
Platelets: 442 10*3/uL — ABNORMAL HIGH (ref 150.0–400.0)
RBC: 4.37 Mil/uL (ref 3.87–5.11)
RDW: 14 % (ref 11.5–15.5)
WBC: 8.5 10*3/uL (ref 4.0–10.5)

## 2023-12-17 LAB — BASIC METABOLIC PANEL
BUN: 14 mg/dL (ref 6–23)
CO2: 24 meq/L (ref 19–32)
Calcium: 8.8 mg/dL (ref 8.4–10.5)
Chloride: 108 meq/L (ref 96–112)
Creatinine, Ser: 0.7 mg/dL (ref 0.40–1.20)
GFR: 106.04 mL/min (ref >60.00)
Glucose, Bld: 86 mg/dL (ref 70–99)
Potassium: 4.4 meq/L (ref 3.5–5.1)
Sodium: 138 meq/L (ref 135–145)

## 2023-12-17 LAB — TSH: TSH: 1.38 u[IU]/mL (ref 0.35–5.50)

## 2023-12-17 LAB — HEPATITIS C ANTIBODY: Hepatitis C Ab: NONREACTIVE

## 2023-12-17 LAB — VITAMIN D 25 HYDROXY (VIT D DEFICIENCY, FRACTURES): VITD: 34.73 ng/mL (ref 30.00–100.00)

## 2023-12-17 NOTE — Progress Notes (Unsigned)
   Chrissie Noa, PhD

## 2023-12-20 ENCOUNTER — Ambulatory Visit: Payer: 59 | Admitting: Clinical

## 2023-12-20 DIAGNOSIS — F4323 Adjustment disorder with mixed anxiety and depressed mood: Secondary | ICD-10-CM | POA: Diagnosis not present

## 2023-12-20 NOTE — Progress Notes (Signed)
 Diagnosis: F43.23 Time: 12:00 pm-12:58 pm CPT: [id number]-04  Cynthia [name] was seen remotely using secure video conferencing. Cyn[place] [name] joined from her home, and the therapist was in her home at the time of the appointment. Client is aware of risks of telehealth and consented to a virtual visit. Cynthia [name] reflected upon feelings of exhaustion she has been experiencing, and therapist queried whether they may relate to emotional stressors. For homework, she will notice when she experiences feelings of fatigue and check into herself emotionally, jotting down words that come to mind. She is scheduled to be seen again in three weeks.    Treatment Plan Client Abilities/Strengths  Cynthia [name] is seeking CBT to help her cope with her daughter's ASD diagnosis and pursue appropriate treatments. She shared that she is also interested in regaining motivation to achieve previous levels of discipline in her lifestyle.  Client Treatment Preferences  Cynthia [name] prefers remote appointments that occur while her children are in school, between 9:30am-12:00am  Client Statement of Needs  Cynthia [name] is seeking CBT to help her cope with and navigate her daughter's diagnosis, as well as establishing and maintaining a self-care routine  Treatment Level  Monthly  Symptoms  Anxiety: sleep irregularities, excessive worry (Status: maintained). Depression: frequent headaches, weight gain, fatigue, sleep difficulties (Status: maintained).  Problems Addressed  New Description, New Description  Goals 1. Cynthia [name] has struggled to maintain self care in recent years Objective Cynthia [name] would like to increase her understanding of her daughters in order to better address their needs Target Date: 2024-09-08 Frequency: Monthly  Progress: 70% one daughter, 25% other daughter Modality: individual  Related Interventions Therapist will incorporate manualized treatments including Parent Training for Disruptive Behaviors in ASD  Therapist will provide referrals for  additional resources as appropriate  Objective Cynthia [name] would like to establish a consistent self care routine  Target Date: 2024-09-08 Frequency: Monthly  Progress: 30 Modality: individual  Related Interventions Therapist will incorporate CBT based strategies to help Cynthia [name] identify and disengage from maladaptive thoughts and behaviors Cynthia [name] will be provided opportunities to process experiences in session 2. Cynthia [name] would like to increase self-accountability toward accomplishing set tasks Target date: 09/08/2024 Progress: 30%  3. Cynthia [name] will process traumatic experiences from her past and consider how these experiences may continue to impact her currently.  Diagnosis Axis none 309.28 (Adjustment disorder with mixed anxiety and depressed mood) - Open - [Signifier: n/a]    Conditions For Discharge Achievement of treatment goals and objectives  ntake Presenting Problem Cynthia [name] presented seeking CBT to cope with her daughter's ASD diagnosis and navigate next steps in her care.  Symptoms excessive worry, weight gain, sleep irregularities, fatigue History of Problem  Cynthia [name]'s 49-year-old daughter Cynthia [name] was diagnosed with ASD in spring of 2022, and her twin daughter Cynthia [name] was diagnosed the following year. She shared that it has been challenging to coordinate appropriate care and services for them.  Recent Trigger  Cynthia [name]'s daughter was diagnosed with ASD in spring of 2022.  Marital and Family Information  Present family concerns/problems: None reported, although Cynthia [name] would like more 1:1 time with her husband  Strengths/resources in the family/friends: Cynthia [name] described a great relationship with her husband  Marital/sexual history patterns: Cynthia [name] has been married for 10 years and has been in a relationship with her husband for 23 years  Family of Origin  Problems in family of origin: Cynthia [name] shared that she has very few memories of her child until she was in the end of elementary school. Her  family moved to the [place]  from  [place] when she was 43 years of age. She shared that she was cared for primarily by a nanny in early childhood, as is typical of [place] culture, and her father traveled frequently for work.  Family background / ethnic factors: Cynthia [name] is from [place]  No needs/concerns related to ethnicity reported when asked: No  Education/Vocation  Interpersonal concerns/problems: Cynthia [name] shared that she feels isolated since having children and social restrictions during the COVID19 pandemic. She moved to [name] for her husband's job in 2014 and she shared that she does not have a significant support system in the area outside of two close friends who live in [place].  Personal strengths: Cynthia [name] does not hold on to grudges and described herself as easy going.  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  Leisure Activities/Daily Functioning  gave up  Legal Status  No Legal Problems  Substance use/abuse/dependence  unspecified  Comments: Weight gain-Cynthia [name] has participated the [name] weight management program  Religion/Spirituality  Not Reported  Other  General Behavior: cooperative  Attire: appropriate  Gait: Not observed-telehealth  Motor Activity: normal  Stream of Thought - Productivity: spontaneous  Stream of thought - Progression: normal  Stream of thought - Language: normal  Emotional tone and reactions - Mood: normal  Emotional tone and reactions - Affect: appropriate  Mental trend/Content of thoughts - Perception: normal  Mental trend/Content of thoughts - Orientation: normal  Mental trend/Content of thoughts - Memory: normal  Mental trend/Content of thoughts - General knowledge: consistent with education  Insight: good  Judgment: good  Intelligence: high      [name],  PhD                  [name], PhD               [name], PhD

## 2023-12-25 ENCOUNTER — Other Ambulatory Visit (HOSPITAL_COMMUNITY): Payer: Self-pay

## 2023-12-25 ENCOUNTER — Other Ambulatory Visit: Payer: Self-pay

## 2023-12-26 ENCOUNTER — Other Ambulatory Visit (HOSPITAL_COMMUNITY): Payer: Self-pay

## 2023-12-26 DIAGNOSIS — H5213 Myopia, bilateral: Secondary | ICD-10-CM | POA: Diagnosis not present

## 2023-12-26 DIAGNOSIS — H52223 Regular astigmatism, bilateral: Secondary | ICD-10-CM | POA: Diagnosis not present

## 2024-01-02 DIAGNOSIS — D3101 Benign neoplasm of right conjunctiva: Secondary | ICD-10-CM | POA: Diagnosis not present

## 2024-01-02 DIAGNOSIS — H04123 Dry eye syndrome of bilateral lacrimal glands: Secondary | ICD-10-CM | POA: Diagnosis not present

## 2024-01-03 ENCOUNTER — Other Ambulatory Visit (HOSPITAL_COMMUNITY): Payer: Self-pay

## 2024-01-07 ENCOUNTER — Ambulatory Visit: Payer: 59 | Admitting: Clinical

## 2024-01-07 DIAGNOSIS — F4323 Adjustment disorder with mixed anxiety and depressed mood: Secondary | ICD-10-CM

## 2024-01-07 NOTE — Progress Notes (Signed)
Diagnosis: F43.23 Time: 11:00 am-11:58 am CPT: [id number]  Boluwatife was seen remotely using secure video conferencing. Jasara joined from her home, and the therapist was in her home at the time of the appointment. Client is aware of risks of telehealth and consented to a virtual visit. Elvia reported that she had taken her PCP's advice of eating more regularly, and feelings of fatigue had largely abated. She had also seen a neurologist for her daughter and a plan had been devised. Session focused on the plan for her mother to move in while recovering from knee surgery, which necessitated a conversation with her brother (who handles their mother's health insurance). Therapist offered validation and support, and suggested several communication strategies. She is scheduled to be seen again in one month.   Treatment Plan Client Abilities/Strengths  Dreanna is seeking CBT to help her cope with her daughter's ASD diagnosis and pursue appropriate treatments. She shared that she is also interested in regaining motivation to achieve previous levels of discipline in her lifestyle.  Client Treatment Preferences  Neko prefers remote appointments that occur while her children are in school, between 9:30am-12:00am  Client Statement of Needs  Merve is seeking CBT to help her cope with and navigate her daughter's diagnosis, as well as establishing and maintaining a self-care routine  Treatment Level  Monthly  Symptoms  Anxiety: sleep irregularities, excessive worry (Status: maintained). Depression: frequent headaches, weight gain, fatigue, sleep difficulties (Status: maintained).  Problems Addressed  New Description, New Description  Goals 1. Markita has struggled to maintain self care in recent years Objective Scotty would like to increase her understanding of her daughters in order to better address their needs Target Date: 2024-09-08 Frequency: Monthly  Progress: 70% one daughter, 25% other daughter  Modality: individual  Related Interventions Therapist will incorporate manualized treatments including Parent Training for Disruptive Behaviors in ASD  Therapist will provide referrals for additional resources as appropriate  Objective Destani would like to establish a consistent self care routine  Target Date: 2024-09-08 Frequency: Monthly  Progress: 30 Modality: individual  Related Interventions Therapist will incorporate CBT based strategies to help Annaliah identify and disengage from maladaptive thoughts and behaviors Meagon will be provided opportunities to process experiences in session 2. Mayson would like to increase self-accountability toward accomplishing set tasks Target date: 09/08/2024 Progress: 30%  3. Zadia will process traumatic experiences from her past and consider how these experiences may continue to impact her currently.  Diagnosis Axis none 309.28 (Adjustment disorder with mixed anxiety and depressed mood) - Open - [Signifier: n/a]    Conditions For Discharge Achievement of treatment goals and objectives  ntake Presenting Problem Riyana presented seeking CBT to cope with her daughter's ASD diagnosis and navigate next steps in her care.  Symptoms excessive worry, weight gain, sleep irregularities, fatigue History of Problem  Shanera's 66-year-old daughter Arna Medici was diagnosed with ASD in spring of 2022, and her twin daughter Renard Hamper was diagnosed the following year. She shared that it has been challenging to coordinate appropriate care and services for them.  Recent Trigger  Brieana's daughter was diagnosed with ASD in spring of 2022.  Marital and Family Information  Present family concerns/problems: None reported, although Minetta would like more 1:1 time with her husband  Strengths/resources in the family/friends: Tida described a "great" relationship with her husband  Marital/sexual history patterns: Helmi has been married for 10 years and has been in a relationship  with her husband for 23 years  Family of Origin  Problems in  family of origin: Emree shared that she has very few memories of her child until she was in the end of elementary school. Her family moved to the [place] from [place] when she was 43 years of age. She shared that she was cared for primarily by a nanny in early childhood, as is typical of [place] culture, and her father traveled frequently for work.  Family background / ethnic factors: Pegeen is from [place]  No needs/concerns related to ethnicity reported when asked: No  Education/Vocation  Interpersonal concerns/problems: Calaya shared that she feels isolated since having children and social restrictions during the COVID19 pandemic. She moved to [facility] for her husband's job in 2014 and she shared that she does not have a significant support system in the area outside of two close friends who live in [place].  Personal strengths: Larcenia does not hold on to grudges and described herself as easy going.  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  Leisure Activities/Daily Functioning  gave up  Legal Status  No Legal Problems  Substance use/abuse/dependence  unspecified  Comments: Weight gain-Lauria has participated the [facility] weight management program  Religion/Spirituality  Not Reported  Other  General Behavior: cooperative  Attire: appropriate  Gait: Not observed-telehealth  Motor Activity: normal  Stream of Thought - Productivity: spontaneous  Stream of thought - Progression: normal  Stream of thought - Language: normal  Emotional tone and reactions - Mood: normal  Emotional tone and reactions - Affect: appropriate  Mental trend/Content of thoughts - Perception: normal  Mental trend/Content of thoughts - Orientation: normal  Mental trend/Content of thoughts -  Memory: normal  Mental trend/Content of thoughts - General knowledge: consistent with education  Insight: good  Judgment: good  Intelligence: high      Chrissie Noa, PhD                 Chrissie Noa, PhD               Chrissie Noa, PhD

## 2024-01-15 DIAGNOSIS — N939 Abnormal uterine and vaginal bleeding, unspecified: Secondary | ICD-10-CM | POA: Diagnosis not present

## 2024-01-15 DIAGNOSIS — Z1231 Encounter for screening mammogram for malignant neoplasm of breast: Secondary | ICD-10-CM | POA: Diagnosis not present

## 2024-01-15 DIAGNOSIS — N938 Other specified abnormal uterine and vaginal bleeding: Secondary | ICD-10-CM | POA: Diagnosis not present

## 2024-01-15 DIAGNOSIS — R102 Pelvic and perineal pain: Secondary | ICD-10-CM | POA: Diagnosis not present

## 2024-01-28 DIAGNOSIS — R632 Polyphagia: Secondary | ICD-10-CM | POA: Diagnosis not present

## 2024-01-28 DIAGNOSIS — E66811 Obesity, class 1: Secondary | ICD-10-CM | POA: Diagnosis not present

## 2024-01-28 DIAGNOSIS — F908 Attention-deficit hyperactivity disorder, other type: Secondary | ICD-10-CM | POA: Diagnosis not present

## 2024-01-28 DIAGNOSIS — F418 Other specified anxiety disorders: Secondary | ICD-10-CM | POA: Diagnosis not present

## 2024-01-28 DIAGNOSIS — Z6832 Body mass index (BMI) 32.0-32.9, adult: Secondary | ICD-10-CM | POA: Diagnosis not present

## 2024-02-02 ENCOUNTER — Ambulatory Visit
Admission: RE | Admit: 2024-02-02 | Discharge: 2024-02-02 | Disposition: A | Discharge status: Home / Self Care | Payer: Self-pay | Source: Ambulatory Visit | Attending: Family Medicine | Admitting: Family Medicine

## 2024-02-02 ENCOUNTER — Other Ambulatory Visit: Payer: Self-pay

## 2024-02-02 VITALS — BP 132/91 | HR 84 | Temp 97.7°F | Resp 17

## 2024-02-02 DIAGNOSIS — H6692 Otitis media, unspecified, left ear: Secondary | ICD-10-CM | POA: Diagnosis not present

## 2024-02-02 DIAGNOSIS — J3489 Other specified disorders of nose and nasal sinuses: Secondary | ICD-10-CM

## 2024-02-02 DIAGNOSIS — J0100 Acute maxillary sinusitis, unspecified: Secondary | ICD-10-CM | POA: Diagnosis not present

## 2024-02-02 MED ORDER — PREDNISONE 10 MG (21) PO TBPK: ORAL_TABLET | Freq: Every day | ORAL | 0 refills | Status: DC

## 2024-02-02 MED ORDER — AMOXICILLIN-POT CLAVULANATE 875-125 MG PO TABS: 1.0000 | ORAL_TABLET | Freq: Two times a day (BID) | ORAL | 0 refills | Status: AC

## 2024-02-02 NOTE — Discharge Instructions (Addendum)
Advised patient to take medications as directed with food to completion.  Advised patient to take prednisone with first dose of Augmentin for the next 10 days.  Encouraged to increase daily water intake to 64 ounces per day while taking these medications.  Advised if symptoms worsen and/or unresolved please follow-up with PCP or here for further evaluation.

## 2024-02-02 NOTE — ED Provider Notes (Signed)
 [facility]    CSN: [id number] Arrival date & time: 02/02/24  1039      History   Chief Complaint Chief Complaint  Patient presents with   Otalgia   Cough   Nasal Congestion    HPI Cynthia [name] is a 43 y.o. female.   HPI 43 year old female presents with ST, nasal congestion, cough and ear pain for 1 week.  Patient believes she may have a sinus infection.  PMH significant for HTN, depression, and obesity.  Past Medical History:  Diagnosis Date   Anemia    Anxiety    Bilateral swelling of feet    Depression    Endometrioma of ovary 06/17/2015   Endometriosis    Gestational diabetes    Gestational hypertension w/o significant proteinuria in 2nd trimester 05/28/2017   BPPs until 32 weeks Weekly labs, serial growth [place], home BP monitoring.   H/O seasonal allergies    High cholesterol    History of anemia    History of ovarian cyst    dermoid   Hyperlipidemia    PCOS (polycystic ovarian syndrome)    Vitamin D deficiency    Wears glasses     Patient Active Problem List   Diagnosis Date Noted   Primary hypertension 02/11/2023   Anxiety and depression 12/31/2020   Pre-diabetes 11/30/2020   At risk for deficient intake of food 11/14/2020   Frequent headaches 09/28/2020   Vitamin D deficiency 08/12/2019   PCOS (polycystic ovarian syndrome) 05/06/2018   GERD (gastroesophageal reflux disease) 02/13/2018   Eustachian tube anomaly 02/13/2018   Depression, recurrent (HCC) 01/03/2018   Gestational diabetes 01/23/2017   Physical exam 12/28/2015   Pelvic adhesive disease 06/17/2015    Class: Present on Admission   Menorrhagia with irregular cycle 12/20/2014   Anemia 12/20/2014   Hyperlipidemia 11/22/2014   Obesity (BMI 30-39.9) 11/22/2014   Anxiety state 11/22/2014    Past Surgical History:  Procedure Laterality Date   BREAST BIOPSY Right 02/15/2022   fibroadenoma   CESAREAN SECTION N/A 07/14/2017   Procedure: CESAREAN SECTION;  Surgeon: Hermina Staggers, MD;  Location: [facility];  Service: Obstetrics;  Laterality: N/A;   HYSTEROSCOPY N/A 03/06/2016   Procedure: HYSTEROSCOPY WITH POLYPECTOMY AND D & C;  Surgeon: Fermin Schwab, MD;  Location: [facility];  Service: Gynecology;  Laterality: N/A;   ivf     LAPAROSCOPIC OVARIAN CYSTECTOMY  2014, 2016   LAPAROSCOPIC OVARIAN CYSTECTOMY Left 06/17/2015   Procedure: LAPAROSCOPIC OVARIAN CYSTECTOMY, LYSIS OF ADHESIONS;  Surgeon: Richardean Chimera, MD;  Location: [facility] [place];  Service: Gynecology;  Laterality: Left;   LAPAROSCOPY OVARIAN CYSTECTOMY  2013   dermoid   OVARIAN CYST REMOVAL      OB History     Gravida  2   Para  1   Term      Preterm  1   AB  1   Living  2      SAB  1   IAB      Ectopic      Multiple  1   Live Births  2            Home Medications    Prior to Admission medications   Medication Sig Start Date End Date Taking? Authorizing Provider  amoxicillin-clavulanate (AUGMENTIN) 875-125 MG tablet Take 1 tablet by mouth 2 (two) times daily for 10 days. 02/02/24 02/12/24 Yes Trevor Iha, FNP  predniSONE (STERAPRED UNI-PAK 21 TAB) 10 MG (  21) TBPK tablet Take by mouth daily. Take 6 tabs by mouth daily  for 2 days, then 5 tabs for 2 days, then 4 tabs for 2 days, then 3 tabs for 2 days, 2 tabs for 2 days, then 1 tab by mouth daily for 2 days 02/02/24  Yes Trevor Iha, FNP  baclofen (LIORESAL) 10 MG tablet Take 1 tablet (10 mg total) by mouth at bedtime as needed for muscle spasms. 09/25/23     cetirizine (ZYRTEC) 10 MG tablet Take 10 mg by mouth daily.    [provider]  diphenhydrAMINE (BENADRYL) 50 MG capsule Take 50 mg by mouth at bedtime as needed.    [provider]  escitalopram (LEXAPRO) 20 MG tablet Take 1 tablet (20 mg total) by mouth daily. 08/07/23   Sheliah Hatch, MD  fluticasone (FLONASE) 50 MCG/ACT nasal spray PLACE 2 SPRAYS INTO BOTH NOSTRILS DAILY. 02/22/21 02/22/22  Helane Rima, DO  lisdexamfetamine (VYVANSE) 20 MG capsule Take 1 capsule (20 mg total) by mouth daily in the afternoon along with the 40 mg in the morning. 11/04/23     lisdexamfetamine (VYVANSE) 40 MG capsule Take 1 capsule (40 mg total) by mouth every day around mid-morning. 02/05/23     losartan (COZAAR) 50 MG tablet Take 1 tablet (50 mg total) by mouth daily. 08/07/23   Sheliah Hatch, MD  norethindrone-ethinyl estradiol (LOESTRIN) 1-20 MG-MCG tablet Take 1 tablet by mouth daily. 12/02/23     spironolactone (ALDACTONE) 50 MG tablet Take 1 tablet (50 mg total) by mouth daily. 09/25/23     tirzepatide (ZEPBOUND) 12.5 MG/0.5ML Pen Inject 12.5 mg into the skin once a week. 07/04/23     topiramate ER (QUDEXY XR) 100 MG CS24 sprinkle capsule Take 1 capsule (100 mg total) by mouth daily. 05/12/23     Vitamin D, Ergocalciferol, (DRISDOL) 1.25 MG (50000 UNIT) CAPS capsule Take 1 capsule (50,000 Units total) by mouth every 7 (seven) days. 09/26/21   Helane Rima, DO  Vitamin D, Ergocalciferol, (DRISDOL) 1.25 MG (50000 UNIT) CAPS capsule Take 1 capsule (50,000 Units total) by mouth every 7 (seven) days. 06/24/23       Family History Family History  Problem Relation Age of Onset   Cancer Mother        breast   Diabetes Mother        and mother's side of family   Hyperlipidemia Mother    Hypertension Mother    Depression Mother    Breast cancer Mother    Cancer Father        thyroid   Thyroid disease Father    Hyperlipidemia Maternal Grandfather    Hypertension Maternal Grandfather    Diabetes Maternal Grandfather    Heart disease Maternal Uncle     Social History Social History   Tobacco Use   Smoking status: Former    Current packs/day: 0.00    Average packs/day: 1 pack/day for 10.0 years (10.0 ttl pk-yrs)    Types: Cigarettes    Start date: 11/22/2001    Quit date: 11/23/2011    Years since quitting: 12.2   Smokeless tobacco: Never  Vaping Use   Vaping status: Never Used  Substance  Use Topics   Alcohol use: Yes    Comment: rare   Drug use: No     Allergies   Patient has no known allergies.   Review of Systems Review of Systems   Physical Exam Triage Vital Signs ED Triage Vitals [02/02/24 1056]  Encounter Vitals Group     BP (!) 132/91     Systolic BP Percentile      Diastolic BP Percentile      Pulse Rate 84     Resp 17     Temp 97.7 F (36.5 C)     Temp Source Oral     SpO2 97 %     Weight      Height      Head Circumference      Peak Flow      Pain Score      Pain Loc      Pain Education      Exclude from Growth Chart    No data found.  Updated Vital Signs BP (!) 132/91 (BP Location: Right Arm)   Pulse 84   Temp 97.7 F (36.5 C) (Oral)   Resp 17   SpO2 97%   Visual Acuity Right Eye Distance:   Left Eye Distance:   Bilateral Distance:    Right Eye Near:   Left Eye Near:    Bilateral Near:     Physical Exam Vitals and nursing note reviewed.  Constitutional:      General: She is not in acute distress.    Appearance: Normal appearance. She is normal weight. She is not ill-appearing.  HENT:     Head: Normocephalic and atraumatic.     Right Ear: Tympanic membrane and external ear normal.     Left Ear: External ear normal.     Ears:     Comments: Left TM: Erythematous, red rimmed; significant eustachian tube dysfunction noted bilaterally    Nose:     Right Sinus: Maxillary sinus tenderness present.     Left Sinus: Maxillary sinus tenderness present.     Comments: Turbinates are erythematous/edematous    Mouth/Throat:     Mouth: Mucous membranes are moist.     Pharynx: Oropharynx is clear.  Eyes:     Extraocular Movements: Extraocular movements intact.     Conjunctiva/sclera: Conjunctivae normal.     Pupils: Pupils are equal, round, and reactive to light.  Cardiovascular:     Rate and Rhythm: Normal rate and regular rhythm.     Pulses: Normal pulses.     Heart sounds: Normal heart sounds. No murmur heard.    No  friction rub. No gallop.  Pulmonary:     Effort: Pulmonary effort is normal.     Breath sounds: Normal breath sounds. No wheezing, rhonchi or rales.  Genitourinary:    General: Normal vulva.     Rectum: Normal.  Musculoskeletal:        General: Normal range of motion.     Cervical back: Normal range of motion and neck supple.  Skin:    General: Skin is warm and dry.  Neurological:     General: No focal deficit present.     Mental Status: She is alert and oriented to person, place, and time.  Psychiatric:        Mood and Affect: Mood normal.        Behavior: Behavior normal.      UC Treatments / Results  Labs (all labs ordered are listed, but only abnormal results are displayed) Labs Reviewed - No data to display  EKG   Radiology No results found.  Procedures Procedures (including critical care time)  Medications Ordered in UC Medications - No data to display  Initial Impression / Assessment and Plan / UC Course  I have reviewed  the triage vital signs and the nursing notes.  Pertinent labs & imaging results that were available during my care of the patient were reviewed by me and considered in my medical decision making (see chart for details).     MDM: 1.  Acute left otitis media-Rx'd Augmentin 875/125 mg tablet: Take 1 tablet twice daily x 10 days; 2.  Sinus pressure, Rx'd Sterapred Unipak 21 tab (tapering from 60 mg to 10 mg over 10 days); 3.  Acute maxillary sinusitis, recurrence not specified-Rx'd Augmentin 875/125 mg tablet: Take 1 tablet twice daily x 10 days. Advised patient to take medications as directed with food to completion.  Advised patient to take prednisone with first dose of Augmentin for the next 10 days.  Encouraged to increase daily water intake to 64 ounces per day while taking these medications.  Advised if symptoms worsen and/or unresolved please follow-up with PCP or here for further evaluation.  Patient discharged home, hemodynamically  stable. Final Clinical Impressions(s) / UC Diagnoses   Final diagnoses:  Acute left otitis media  Acute maxillary sinusitis, recurrence not specified  Sinus pressure     Discharge Instructions      Advised patient to take medications as directed with food to completion.  Advised patient to take prednisone with first dose of Augmentin for the next 10 days.  Encouraged to increase daily water intake to 64 ounces per day while taking these medications.  Advised if symptoms worsen and/or unresolved please follow-up with PCP or here for further evaluation.     ED Prescriptions     Medication Sig Dispense Auth. Provider   amoxicillin-clavulanate (AUGMENTIN) 875-125 MG tablet Take 1 tablet by mouth 2 (two) times daily for 10 days. 20 tablet Trevor Iha, FNP   predniSONE (STERAPRED UNI-PAK 21 TAB) 10 MG (21) TBPK tablet Take by mouth daily. Take 6 tabs by mouth daily  for 2 days, then 5 tabs for 2 days, then 4 tabs for 2 days, then 3 tabs for 2 days, 2 tabs for 2 days, then 1 tab by mouth daily for 2 days 42 tablet Trevor Iha, FNP      PDMP not reviewed this encounter.   Trevor Iha, FNP 02/02/24 1132

## 2024-02-02 NOTE — ED Triage Notes (Signed)
 Pt c/o cough, runny nose and sore throat x 1 week. Denies recent fever. Taking mucinex and sudafed.   Whole family was sick last week, thinking it was flu. But sxs worsening in last couple days. Possible sinus infection.

## 2024-02-03 ENCOUNTER — Ambulatory Visit (INDEPENDENT_AMBULATORY_CARE_PROVIDER_SITE_OTHER): Payer: 59 | Admitting: Clinical

## 2024-02-03 DIAGNOSIS — F4323 Adjustment disorder with mixed anxiety and depressed mood: Secondary | ICD-10-CM | POA: Diagnosis not present

## 2024-02-03 NOTE — Progress Notes (Signed)
 Diagnosis: F43.23 Time: 10:00 am-10:58 am CPT: 40981X-91  Terrel was seen remotely using secure video conferencing. Maryah joined from her home, and the therapist was in her home at the time of the appointment. Client is aware of risks of telehealth and consented to a virtual visit. Session focused on feelings of irritability that had arisen since her last session. Therapist offered validation and support, and suggested journaling exercises to release her feelings (for example, listing her worries and what she can/can't do, writing out her intentions for the day, venting her frustrations). She is scheduled to be seen again in three weeks.    Treatment Plan Client Abilities/Strengths  Briarrose is seeking CBT to help her cope with her daughter's ASD diagnosis and pursue appropriate treatments. She shared that she is also interested in regaining motivation to achieve previous levels of discipline in her lifestyle.  Client Treatment Preferences  Ajanay prefers remote appointments that occur while her children are in school, between 9:30am-12:00am  Client Statement of Needs  Zuma is seeking CBT to help her cope with and navigate her daughter's diagnosis, as well as establishing and maintaining a self-care routine  Treatment Level  Monthly  Symptoms  Anxiety: sleep irregularities, excessive worry (Status: maintained). Depression: frequent headaches, weight gain, fatigue, sleep difficulties (Status: maintained).  Problems Addressed  New Description, New Description  Goals 1. Birda has struggled to maintain self care in recent years Objective Karalynn would like to increase her understanding of her daughters in order to better address their needs Target Date: 2024-09-08 Frequency: Monthly  Progress: 70% one daughter, 25% other daughter Modality: individual  Related Interventions Therapist will incorporate manualized treatments including Parent Training for Disruptive Behaviors in ASD  Therapist  will provide referrals for additional resources as appropriate  Objective Montia would like to establish a consistent self care routine  Target Date: 2024-09-08 Frequency: Monthly  Progress: 30 Modality: individual  Related Interventions Therapist will incorporate CBT based strategies to help Petronella identify and disengage from maladaptive thoughts and behaviors Leahanna will be provided opportunities to process experiences in session 2. Tracey would like to increase self-accountability toward accomplishing set tasks Target date: 09/08/2024 Progress: 30%  3. Shalon will process traumatic experiences from her past and consider how these experiences may continue to impact her currently.  Diagnosis Axis none 309.28 (Adjustment disorder with mixed anxiety and depressed mood) - Open - [Signifier: n/a]    Conditions For Discharge Achievement of treatment goals and objectives  ntake Presenting Problem Latrenda presented seeking CBT to cope with her daughter's ASD diagnosis and navigate next steps in her care.  Symptoms excessive worry, weight gain, sleep irregularities, fatigue History of Problem  Tylisha's 80-year-old daughter Arna Medici was diagnosed with ASD in spring of 2022, and her twin daughter Renard Hamper was diagnosed the following year. She shared that it has been challenging to coordinate appropriate care and services for them.  Recent Trigger  Armandina's daughter was diagnosed with ASD in spring of 2022.  Marital and Family Information  Present family concerns/problems: None reported, although Malynda would like more 1:1 time with her husband  Strengths/resources in the family/friends: Nikoleta described a "great" relationship with her husband  Marital/sexual history patterns: Ivee has been married for 10 years and has been in a relationship with her husband for 23 years  Family of Origin  Problems in family of origin: Niquita shared that she has very few memories of her child until she was in the end of  elementary school. Her family moved to  the [place] from [place] when she was 43 years of age. She shared that she was cared for primarily by a nanny in early childhood, as is typical of [place] culture, and her father traveled frequently for work.  Family background / ethnic factors: Kailah is from [place]  No needs/concerns related to ethnicity reported when asked: No  Education/Vocation  Interpersonal concerns/problems: Maelynn shared that she feels isolated since having children and social restrictions during the COVID19 pandemic. She moved to [facility] for her husband's job in 2014 and she shared that she does not have a significant support system in the area outside of two close friends who live in [place].  Personal strengths: Dolce does not hold on to grudges and described herself as easy going.  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  Leisure Activities/Daily Functioning  gave up  Legal Status  No Legal Problems  Substance use/abuse/dependence  unspecified  Comments: Weight gain-Laycie has participated the [facility] weight management program  Religion/Spirituality  Not Reported  Other  General Behavior: cooperative  Attire: appropriate  Gait: Not observed-telehealth  Motor Activity: normal  Stream of Thought - Productivity: spontaneous  Stream of thought - Progression: normal  Stream of thought - Language: normal  Emotional tone and reactions - Mood: normal  Emotional tone and reactions - Affect: appropriate  Mental trend/Content of thoughts - Perception: normal  Mental trend/Content of thoughts - Orientation: normal  Mental trend/Content of thoughts - Memory: normal  Mental trend/Content of thoughts - General knowledge: consistent with education  Insight: good  Judgment: good  Intelligence: high      Chrissie Noa, PhD                 Chrissie Noa, PhD               Chrissie Noa, PhD               Chrissie Noa, PhD

## 2024-02-10 ENCOUNTER — Other Ambulatory Visit (HOSPITAL_COMMUNITY): Payer: Self-pay

## 2024-02-10 ENCOUNTER — Encounter (HOSPITAL_COMMUNITY): Payer: Self-pay

## 2024-02-12 ENCOUNTER — Other Ambulatory Visit (HOSPITAL_COMMUNITY): Payer: Self-pay

## 2024-02-12 MED ORDER — LISDEXAMFETAMINE DIMESYLATE 40 MG PO CAPS
40.0000 mg | ORAL_CAPSULE | Freq: Every morning | ORAL | 0 refills | Status: DC
  Filled 2024-02-12: qty 30, 30d supply, fill #0

## 2024-02-17 DIAGNOSIS — Z1231 Encounter for screening mammogram for malignant neoplasm of breast: Secondary | ICD-10-CM | POA: Diagnosis not present

## 2024-02-18 ENCOUNTER — Other Ambulatory Visit (HOSPITAL_COMMUNITY): Payer: Self-pay

## 2024-02-21 ENCOUNTER — Ambulatory Visit: Payer: 59 | Admitting: Clinical

## 2024-03-03 ENCOUNTER — Ambulatory Visit (INDEPENDENT_AMBULATORY_CARE_PROVIDER_SITE_OTHER): Payer: 59 | Admitting: Clinical

## 2024-03-03 DIAGNOSIS — F4323 Adjustment disorder with mixed anxiety and depressed mood: Secondary | ICD-10-CM

## 2024-03-03 NOTE — Progress Notes (Signed)
 Diagnosis: F43.23 Time: 10:00 am-11:00 am CPT: 28413K-44  Racheal was seen remotely using secure video conferencing. Aleyza joined from her home, and the therapist was in her home at the time of the appointment. Client is aware of risks of telehealth and consented to a virtual visit. Truda reported upon several events that had arisen since her last session, including the death of a family pet and a conflict with a friend. She also reflected upon feelings of fear and despair related to her daughter. Therapist offered validation, support, and empathy, encouraging Elsye to express her emotions. She is scheduled to be seen again in three weeks.    Treatment Plan Client Abilities/Strengths  Alexina is seeking CBT to help her cope with her daughter's ASD diagnosis and pursue appropriate treatments. She shared that she is also interested in regaining motivation to achieve previous levels of discipline in her lifestyle.  Client Treatment Preferences  Yoona prefers remote appointments that occur while her children are in school, between 9:30am-12:00am  Client Statement of Needs  Amalia is seeking CBT to help her cope with and navigate her daughter's diagnosis, as well as establishing and maintaining a self-care routine  Treatment Level  Monthly  Symptoms  Anxiety: sleep irregularities, excessive worry (Status: maintained). Depression: frequent headaches, weight gain, fatigue, sleep difficulties (Status: maintained).  Problems Addressed  New Description, New Description  Goals 1. Sonal has struggled to maintain self care in recent years Objective Patrena would like to increase her understanding of her daughters in order to better address their needs Target Date: 2024-09-08 Frequency: Monthly  Progress: 70% one daughter, 25% other daughter Modality: individual  Related Interventions Therapist will incorporate manualized treatments including Parent Training for Disruptive Behaviors in ASD  Therapist  will provide referrals for additional resources as appropriate  Objective Deyci would like to establish a consistent self care routine  Target Date: 2024-09-08 Frequency: Monthly  Progress: 30 Modality: individual  Related Interventions Therapist will incorporate CBT based strategies to help Takina identify and disengage from maladaptive thoughts and behaviors Shadasia will be provided opportunities to process experiences in session 2. Alohilani would like to increase self-accountability toward accomplishing set tasks Target date: 09/08/2024 Progress: 30%  3. Ingra will process traumatic experiences from her past and consider how these experiences may continue to impact her currently.  Diagnosis Axis none 309.28 (Adjustment disorder with mixed anxiety and depressed mood) - Open - [Signifier: n/a]    Conditions For Discharge Achievement of treatment goals and objectives  ntake Presenting Problem Jamecia presented seeking CBT to cope with her daughter's ASD diagnosis and navigate next steps in her care.  Symptoms excessive worry, weight gain, sleep irregularities, fatigue History of Problem  Dhruti's 74-year-old daughter Arna Medici was diagnosed with ASD in spring of 2022, and her twin daughter Renard Hamper was diagnosed the following year. She shared that it has been challenging to coordinate appropriate care and services for them.  Recent Trigger  Tamirra's daughter was diagnosed with ASD in spring of 2022.  Marital and Family Information  Present family concerns/problems: None reported, although Aldina would like more 1:1 time with her husband  Strengths/resources in the family/friends: Zanae described a "great" relationship with her husband  Marital/sexual history patterns: Keoni has been married for 10 years and has been in a relationship with her husband for 23 years  Family of Origin  Problems in family of origin: Sahian shared that she has very few memories of her child until she was in the end of  elementary school.  Her family moved to the [place] from [place] when she was 43 years of age. She shared that she was cared for primarily by a nanny in early childhood, as is typical of [place] culture, and her father traveled frequently for work.  Family background / ethnic factors: Allysen is from [place]  No needs/concerns related to ethnicity reported when asked: No  Education/Vocation  Interpersonal concerns/problems: Cecilie shared that she feels isolated since having children and social restrictions during the COVID19 pandemic. She moved to [facility] for her husband's job in 2014 and she shared that she does not have a significant support system in the area outside of two close friends who live in [place].  Personal strengths: Malayzia does not hold on to grudges and described herself as easy going.  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  Leisure Activities/Daily Functioning  gave up  Legal Status  No Legal Problems  Substance use/abuse/dependence  unspecified  Comments: Weight gain-Shallen has participated the [facility] weight management program  Religion/Spirituality  Not Reported  Other  General Behavior: cooperative  Attire: appropriate  Gait: Not observed-telehealth  Motor Activity: normal  Stream of Thought - Productivity: spontaneous  Stream of thought - Progression: normal  Stream of thought - Language: normal  Emotional tone and reactions - Mood: normal  Emotional tone and reactions - Affect: appropriate  Mental trend/Content of thoughts - Perception: normal  Mental trend/Content of thoughts - Orientation: normal  Mental trend/Content of thoughts - Memory: normal  Mental trend/Content of thoughts - General knowledge: consistent with education  Insight: good  Judgment: good  Intelligence: high       Chrissie Noa, PhD               Chrissie Noa, PhD               Chrissie Noa, PhD

## 2024-03-10 ENCOUNTER — Other Ambulatory Visit (HOSPITAL_COMMUNITY): Payer: Self-pay

## 2024-03-10 DIAGNOSIS — Z862 Personal history of diseases of the blood and blood-forming organs and certain disorders involving the immune mechanism: Secondary | ICD-10-CM | POA: Diagnosis not present

## 2024-03-10 DIAGNOSIS — R632 Polyphagia: Secondary | ICD-10-CM | POA: Diagnosis not present

## 2024-03-10 DIAGNOSIS — Z6833 Body mass index (BMI) 33.0-33.9, adult: Secondary | ICD-10-CM | POA: Diagnosis not present

## 2024-03-10 DIAGNOSIS — R5383 Other fatigue: Secondary | ICD-10-CM | POA: Diagnosis not present

## 2024-03-10 DIAGNOSIS — E66811 Obesity, class 1: Secondary | ICD-10-CM | POA: Diagnosis not present

## 2024-03-10 DIAGNOSIS — F908 Attention-deficit hyperactivity disorder, other type: Secondary | ICD-10-CM | POA: Diagnosis not present

## 2024-03-10 DIAGNOSIS — Z79899 Other long term (current) drug therapy: Secondary | ICD-10-CM | POA: Diagnosis not present

## 2024-03-10 DIAGNOSIS — R7301 Impaired fasting glucose: Secondary | ICD-10-CM | POA: Diagnosis not present

## 2024-03-10 MED ORDER — ZEPBOUND 15 MG/0.5ML ~~LOC~~ SOAJ
15.0000 mg | SUBCUTANEOUS | 1 refills | Status: DC
  Filled 2024-03-10 – 2024-03-31 (×2): qty 2, 28d supply, fill #0
  Filled 2024-04-23 – 2024-05-06 (×2): qty 2, 28d supply, fill #1

## 2024-03-12 ENCOUNTER — Other Ambulatory Visit (HOSPITAL_COMMUNITY): Payer: Self-pay

## 2024-03-12 MED ORDER — LISDEXAMFETAMINE DIMESYLATE 40 MG PO CAPS
40.0000 mg | ORAL_CAPSULE | Freq: Every morning | ORAL | 0 refills | Status: DC
  Filled 2024-03-13: qty 30, 30d supply, fill #0

## 2024-03-12 MED ORDER — LISDEXAMFETAMINE DIMESYLATE 20 MG PO CAPS
20.0000 mg | ORAL_CAPSULE | Freq: Every day | ORAL | 0 refills | Status: DC
  Filled 2024-03-12 – 2024-05-06 (×2): qty 30, 30d supply, fill #0

## 2024-03-13 ENCOUNTER — Other Ambulatory Visit (HOSPITAL_COMMUNITY): Payer: Self-pay

## 2024-03-13 ENCOUNTER — Other Ambulatory Visit: Payer: Self-pay

## 2024-03-20 ENCOUNTER — Other Ambulatory Visit (HOSPITAL_COMMUNITY): Payer: Self-pay

## 2024-03-23 ENCOUNTER — Ambulatory Visit: Payer: 59 | Admitting: Clinical

## 2024-03-23 NOTE — Progress Notes (Unsigned)
   Chrissie Noa, PhD

## 2024-03-31 ENCOUNTER — Other Ambulatory Visit (HOSPITAL_COMMUNITY): Payer: Self-pay

## 2024-04-02 ENCOUNTER — Other Ambulatory Visit (HOSPITAL_COMMUNITY): Payer: Self-pay

## 2024-04-06 ENCOUNTER — Ambulatory Visit (INDEPENDENT_AMBULATORY_CARE_PROVIDER_SITE_OTHER): Payer: 59 | Admitting: Clinical

## 2024-04-06 DIAGNOSIS — F4323 Adjustment disorder with mixed anxiety and depressed mood: Secondary | ICD-10-CM

## 2024-04-06 NOTE — Progress Notes (Signed)
 Diagnosis: F43.23 Time: 10:00 am-11:00 am CPT: 16109U-04  Channie was seen remotely using secure video conferencing. Trula joined from her home, and the therapist was in her office at the time of the appointment. Client is aware of risks of telehealth and consented to a virtual visit. Henlee had missed her last appointment when her mother in law had suffered multiple strokes, requiring an emergency trip to [place] . Session focused on processing this experience, as well as growing concerns for her children's future. Therapist offered validation and support. She is scheduled to be seen again on Monday.  Treatment Plan Client Abilities/Strengths  Belem is seeking CBT to help her cope with her daughter's ASD diagnosis and pursue appropriate treatments. She shared that she is also interested in regaining motivation to achieve previous levels of discipline in her lifestyle.  Client Treatment Preferences  Makenli prefers remote appointments that occur while her children are in school, between 9:30am-12:00am  Client Statement of Needs  Emonni is seeking CBT to help her cope with and navigate her daughter's diagnosis, as well as establishing and maintaining a self-care routine  Treatment Level  Monthly  Symptoms  Anxiety: sleep irregularities, excessive worry (Status: maintained). Depression: frequent headaches, weight gain, fatigue, sleep difficulties (Status: maintained).  Problems Addressed  New Description, New Description  Goals 1. Darly has struggled to maintain self care in recent years Objective Ahlia would like to increase her understanding of her daughters in order to better address their needs Target Date: 2024-09-08 Frequency: Monthly  Progress: 70% one daughter, 25% other daughter Modality: individual  Related Interventions Therapist will incorporate manualized treatments including Parent Training for Disruptive Behaviors in ASD  Therapist will provide referrals for additional  resources as appropriate  Objective Kati would like to establish a consistent self care routine  Target Date: 2024-09-08 Frequency: Monthly  Progress: 30 Modality: individual  Related Interventions Therapist will incorporate CBT based strategies to help Hydia identify and disengage from maladaptive thoughts and behaviors Kalene will be provided opportunities to process experiences in session 2. Lynlee would like to increase self-accountability toward accomplishing set tasks Target date: 09/08/2024 Progress: 30%  3. Terrance will process traumatic experiences from her past and consider how these experiences may continue to impact her currently.  Diagnosis Axis none 309.28 (Adjustment disorder with mixed anxiety and depressed mood) - Open - [Signifier: n/a]    Conditions For Discharge Achievement of treatment goals and objectives  ntake Presenting Problem Amika presented seeking CBT to cope with her daughter's ASD diagnosis and navigate next steps in her care.  Symptoms excessive worry, weight gain, sleep irregularities, fatigue History of Problem  Jatasia's 28-year-old daughter Nicholes Barks was diagnosed with ASD in spring of 2022, and her twin daughter Shirly Dow was diagnosed the following year. She shared that it has been challenging to coordinate appropriate care and services for them.  Recent Trigger  Zamoria's daughter was diagnosed with ASD in spring of 2022.  Marital and Family Information  Present family concerns/problems: None reported, although Harvey would like more 1:1 time with her husband  Strengths/resources in the family/friends: Sherrina described a "great" relationship with her husband  Marital/sexual history patterns: Tytionna has been married for 10 years and has been in a relationship with her husband for 23 years  Family of Origin  Problems in family of origin: Huldah shared that she has very few memories of her child until she was in the end of elementary school. Her family moved  to the [place]  from [place] when she was  43 years of age. She shared that she was cared for primarily by a nanny in early childhood, as is typical of [place] culture, and her father traveled frequently for work.  Family background / ethnic factors: Ahlia is from [place]  No needs/concerns related to ethnicity reported when asked: No  Education/Vocation  Interpersonal concerns/problems: Belvia shared that she feels isolated since having children and social restrictions during the COVID19 pandemic. She moved to [facility] for her husband's job in 2014 and she shared that she does not have a significant support system in the area outside of two close friends who live in [place].  Personal strengths: Jericka does not hold on to grudges and described herself as easy going.  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  Leisure Activities/Daily Functioning  gave up  Legal Status  No Legal Problems  Substance use/abuse/dependence  unspecified  Comments: Weight gain-Charise has participated the [facility] weight management program  Religion/Spirituality  Not Reported  Other  General Behavior: cooperative  Attire: appropriate  Gait: Not observed-telehealth  Motor Activity: normal  Stream of Thought - Productivity: spontaneous  Stream of thought - Progression: normal  Stream of thought - Language: normal  Emotional tone and reactions - Mood: normal  Emotional tone and reactions - Affect: appropriate  Mental trend/Content of thoughts - Perception: normal  Mental trend/Content of thoughts - Orientation: normal  Mental trend/Content of thoughts - Memory: normal  Mental trend/Content of thoughts - General knowledge: consistent with education  Insight: good  Judgment: good  Intelligence: high      Arlene Lacy,  PhD               Arlene Lacy, PhD

## 2024-04-13 ENCOUNTER — Ambulatory Visit (INDEPENDENT_AMBULATORY_CARE_PROVIDER_SITE_OTHER): Admitting: Clinical

## 2024-04-13 DIAGNOSIS — F4323 Adjustment disorder with mixed anxiety and depressed mood: Secondary | ICD-10-CM | POA: Diagnosis not present

## 2024-04-13 NOTE — Progress Notes (Signed)
 Diagnosis: F43.23 Time: 10:00 am-11:00 am CPT: 16109U-04  Lissy was seen remotely using secure video conferencing. Arriah joined from a parked car at [employer] parking lot, and the therapist was in her office at the time of the appointment. Client is aware of risks of telehealth and consented to a virtual visit. Matilda reported upon a series of escalating stressors that had arisen since her last session. Therapist encouraged her to identify social support, and to reach out to her PCP to access her prescription for Xanax . Therapist engaged her in discussion of how to navigate the week while incorporating comfort and distraction where she can. She is scheduled to be seen again in one week.  Treatment Plan Client Abilities/Strengths  Cleona is seeking CBT to help her cope with her daughter's ASD diagnosis and pursue appropriate treatments. She shared that she is also interested in regaining motivation to achieve previous levels of discipline in her lifestyle.  Client Treatment Preferences  Janayia prefers remote appointments that occur while her children are in school, between 9:30am-12:00am  Client Statement of Needs  Anushree is seeking CBT to help her cope with and navigate her daughter's diagnosis, as well as establishing and maintaining a self-care routine  Treatment Level  Monthly  Symptoms  Anxiety: sleep irregularities, excessive worry (Status: maintained). Depression: frequent headaches, weight gain, fatigue, sleep difficulties (Status: maintained).  Problems Addressed  New Description, New Description  Goals 1. Shaterria has struggled to maintain self care in recent years Objective Randal would like to increase her understanding of her daughters in order to better address their needs Target Date: 2024-09-08 Frequency: Monthly  Progress: 70% one daughter, 25% other daughter Modality: individual  Related Interventions Therapist will incorporate manualized treatments  including Parent Training for Disruptive Behaviors in ASD  Therapist will provide referrals for additional resources as appropriate  Objective Doneisha would like to establish a consistent self care routine  Target Date: 2024-09-08 Frequency: Monthly  Progress: 30 Modality: individual  Related Interventions Therapist will incorporate CBT based strategies to help Kye identify and disengage from maladaptive thoughts and behaviors Makenzee will be provided opportunities to process experiences in session 2. Saida would like to increase self-accountability toward accomplishing set tasks Target date: 09/08/2024 Progress: 30%  3. Leilana will process traumatic experiences from her past and consider how these experiences may continue to impact her currently.  Diagnosis Axis none 309.28 (Adjustment disorder with mixed anxiety and depressed mood) - Open - [Signifier: n/a]    Conditions For Discharge Achievement of treatment goals and objectives  ntake Presenting Problem Nicolas presented seeking CBT to cope with her daughter's ASD diagnosis and navigate next steps in her care.  Symptoms excessive worry, weight gain, sleep irregularities, fatigue History of Problem  Nikola's 76-year-old daughter Nicholes Barks was diagnosed with ASD in spring of 2022, and her twin daughter Shirly Dow was diagnosed the following year. She shared that it has been challenging to coordinate appropriate care and services for them.  Recent Trigger  Antrice's daughter was diagnosed with ASD in spring of 2022.  Marital and Family Information  Present family concerns/problems: None reported, although Alexarae would like more 1:1 time with her husband  Strengths/resources in the family/friends: Mirinda described a "great" relationship with her husband  Marital/sexual history patterns: Latreace has been married for 10 years and has been in a relationship with her husband for 23 years  Family of Origin  Problems in family of origin: Mikiala shared  that she has very few memories of  her child until she was in the end of elementary school. Her family moved to [place]  from [place] when she was 43 years of age. She shared that she was cared for primarily by a nanny in early childhood, as is typical of [place] culture, and her father traveled frequently for work.  Family background / ethnic factors: Annali is from [place]  No needs/concerns related to ethnicity reported when asked: No  Education/Vocation  Interpersonal concerns/problems: Loula shared that she feels isolated since having children and social restrictions during the COVID19 pandemic. She moved to [facility] for her husband's job in 2014 and she shared that she does not have a significant support system in the area outside of two close friends who live in [place].  Personal strengths: Valkyrie does not hold on to grudges and described herself as easy going.  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  Leisure Activities/Daily Functioning  gave up  Legal Status  No Legal Problems  Substance use/abuse/dependence  unspecified  Comments: Weight gain-Jermany has participated the [employer] [facility] weight management program  Religion/Spirituality  Not Reported  Other  General Behavior: cooperative  Attire: appropriate  Gait: Not observed-telehealth  Motor Activity: normal  Stream of Thought - Productivity: spontaneous  Stream of thought - Progression: normal  Stream of thought - Language: normal  Emotional tone and reactions - Mood: normal  Emotional tone and reactions - Affect: appropriate  Mental trend/Content of thoughts - Perception: normal  Mental trend/Content of thoughts - Orientation: normal  Mental trend/Content of thoughts - Memory: normal  Mental trend/Content of thoughts - General knowledge: consistent with education   Insight: good  Judgment: good  Intelligence: high      Arlene Lacy, PhD               Arlene Lacy, PhD               Arlene Lacy, PhD

## 2024-04-23 ENCOUNTER — Other Ambulatory Visit: Payer: Self-pay | Admitting: Family Medicine

## 2024-04-23 ENCOUNTER — Ambulatory Visit: Admitting: Clinical

## 2024-04-23 ENCOUNTER — Other Ambulatory Visit (HOSPITAL_COMMUNITY): Payer: Self-pay

## 2024-04-23 ENCOUNTER — Other Ambulatory Visit (INDEPENDENT_AMBULATORY_CARE_PROVIDER_SITE_OTHER): Payer: Self-pay | Admitting: Family Medicine

## 2024-04-23 DIAGNOSIS — F32A Depression, unspecified: Secondary | ICD-10-CM

## 2024-04-23 MED ORDER — ESCITALOPRAM OXALATE 20 MG PO TABS
20.0000 mg | ORAL_TABLET | Freq: Every day | ORAL | 1 refills | Status: DC
  Filled 2024-04-23 – 2024-05-06 (×3): qty 90, 90d supply, fill #0
  Filled 2024-09-30 – 2024-10-26 (×2): qty 90, 90d supply, fill #1

## 2024-04-23 MED ORDER — LOSARTAN POTASSIUM 50 MG PO TABS
50.0000 mg | ORAL_TABLET | Freq: Every day | ORAL | 1 refills | Status: DC
  Filled 2024-04-23 – 2024-05-06 (×2): qty 90, 90d supply, fill #0
  Filled 2024-09-30 – 2024-10-26 (×2): qty 90, 90d supply, fill #1

## 2024-04-23 NOTE — Progress Notes (Unsigned)
   Chrissie Noa, PhD

## 2024-05-05 ENCOUNTER — Other Ambulatory Visit (HOSPITAL_COMMUNITY): Payer: Self-pay

## 2024-05-06 ENCOUNTER — Other Ambulatory Visit: Payer: Self-pay

## 2024-05-06 ENCOUNTER — Other Ambulatory Visit (HOSPITAL_COMMUNITY): Payer: Self-pay

## 2024-05-06 MED ORDER — LISDEXAMFETAMINE DIMESYLATE 40 MG PO CAPS
40.0000 mg | ORAL_CAPSULE | Freq: Every morning | ORAL | 0 refills | Status: DC
  Filled 2024-05-06: qty 30, 30d supply, fill #0

## 2024-05-07 ENCOUNTER — Ambulatory Visit: Admitting: Clinical

## 2024-05-14 ENCOUNTER — Other Ambulatory Visit (HOSPITAL_COMMUNITY): Payer: Self-pay

## 2024-05-14 DIAGNOSIS — I10 Essential (primary) hypertension: Secondary | ICD-10-CM | POA: Diagnosis not present

## 2024-05-14 DIAGNOSIS — E66811 Obesity, class 1: Secondary | ICD-10-CM | POA: Diagnosis not present

## 2024-05-14 DIAGNOSIS — Z6833 Body mass index (BMI) 33.0-33.9, adult: Secondary | ICD-10-CM | POA: Diagnosis not present

## 2024-05-14 DIAGNOSIS — R632 Polyphagia: Secondary | ICD-10-CM | POA: Diagnosis not present

## 2024-05-14 DIAGNOSIS — Z72820 Sleep deprivation: Secondary | ICD-10-CM | POA: Diagnosis not present

## 2024-05-14 DIAGNOSIS — F908 Attention-deficit hyperactivity disorder, other type: Secondary | ICD-10-CM | POA: Diagnosis not present

## 2024-05-14 DIAGNOSIS — F439 Reaction to severe stress, unspecified: Secondary | ICD-10-CM | POA: Diagnosis not present

## 2024-05-14 MED ORDER — ALPRAZOLAM 0.5 MG PO TABS
0.5000 mg | ORAL_TABLET | Freq: Every evening | ORAL | 0 refills | Status: DC | PRN
  Filled 2024-05-14: qty 30, 30d supply, fill #0

## 2024-05-18 ENCOUNTER — Ambulatory Visit (INDEPENDENT_AMBULATORY_CARE_PROVIDER_SITE_OTHER): Admitting: Clinical

## 2024-05-18 DIAGNOSIS — F4323 Adjustment disorder with mixed anxiety and depressed mood: Secondary | ICD-10-CM | POA: Diagnosis not present

## 2024-05-18 NOTE — Progress Notes (Signed)
 Diagnosis: F43.23 Time: 12:00 pm-1:00 pm CPT: 36644I-34  Cynthia [name] was seen remotely using secure video conferencing. She was in her home, and the therapist was in her office at the time of the appointment. Client is aware of risks of telehealth and consented to a virtual visit. She reported some reduction in stress since her last visit, which she attributed to a conversation with her husband, as well as greater clarity in next steps for her children. She processed several events that had taken place with her children, and therapist offered validation and support. She is scheduled to be seen again in two weeks.  Treatment Plan Client Abilities/Strengths  Cynthia [name] is seeking CBT to help her cope with her daughter's ASD diagnosis and pursue appropriate treatments. She shared that she is also interested in regaining motivation to achieve previous levels of discipline in her lifestyle.  Client Treatment Preferences  Cynthia [name] prefers remote appointments that occur while her children are in school, between 9:30am-12:00am  Client Statement of Needs  Cynthia [name] is seeking CBT to help her cope with and navigate her daughter's diagnosis, as well as establishing and maintaining a self-care routine  Treatment Level  Monthly  Symptoms  Anxiety: sleep irregularities, excessive worry (Status: maintained). Depression: frequent headaches, weight gain, fatigue, sleep difficulties (Status: maintained).  Problems Addressed  New Description, New Description  Goals 1. Cynthia [name] has struggled to maintain self care in recent years Objective Cynthia [name] would like to increase her understanding of her daughters in order to better address their needs Target Date: 2024-09-08 Frequency: Monthly  Progress: 70% one daughter, 25% other daughter Modality: individual  Related Interventions Therapist will incorporate manualized treatments including Parent Training for Disruptive Behaviors in ASD  Therapist will provide referrals for  additional resources as appropriate  Objective Cynthia [name] would like to establish a consistent self care routine  Target Date: 2024-09-08 Frequency: Monthly  Progress: 30 Modality: individual  Related Interventions Therapist will incorporate CBT based strategies to help Cynthia [name] identify and disengage from maladaptive thoughts and behaviors Cynthia [name] will be provided opportunities to process experiences in session 2. Cynthia [name] would like to increase self-accountability toward accomplishing set tasks Target date: 09/08/2024 Progress: 30%  3. Cynthia [name] will process traumatic experiences from her past and consider how these experiences may continue to impact her currently.  Diagnosis Axis none 309.28 (Adjustment disorder with mixed anxiety and depressed mood) - Open - [Signifier: n/a]    Conditions For Discharge Achievement of treatment goals and objectives  ntake Presenting Problem Cynthia [name] presented seeking CBT to cope with her daughter's ASD diagnosis and navigate next steps in her care.  Symptoms excessive worry, weight gain, sleep irregularities, fatigue History of Problem  [name]'s 56-year-old daughter Cynthia [name] was diagnosed with ASD in spring of 2022, and her twin daughter Cynthia [name] was diagnosed the following year. She shared that it has been challenging to coordinate appropriate care and services for them.  Recent Trigger  Cynthia [name]'s daughter was diagnosed with ASD in spring of 2022.  Marital and Family Information  Present family concerns/problems: None reported, although Cynthia [name] would like more 1:1 time with her husband  Strengths/resources in the family/friends: Cynthia [name] described a "great" relationship with her husband  Marital/sexual history patterns: Cynthia [name] has been married for 10 years and has been in a relationship with her husband for 23 years  Family of Origin  Problems in family of origin: Cynthia [name] shared that she has very few memories of her child until she was in the end of elementary school. Her  family [place]  from [place] when she was 43 years of age. She shared that she was cared for primarily by a nanny in early childhood, as is typical of [place] culture, and her father traveled frequently for work.  Family background / ethnic factors: Cynthia [name] is from [place]  No needs/concerns related to ethnicity reported when asked: No  Education/Vocation  Interpersonal concerns/problems: Cynthia [name] shared that she feels isolated since having children and social restrictions during the COVID19 pandemic. She moved to [facility] for her husband's job in 2014 and she shared that she does not have a significant support system in the area outside of two close friends who live in [place].  Personal strengths: Cynthia [name] does not hold on to grudges and described herself as easy going.  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  Leisure Activities/Daily Functioning  gave up  Legal Status  No Legal Problems  Substance use/abuse/dependence  unspecified  Comments: Weight gain-Cynthia [name] has participated the [facility] weight management program  Religion/Spirituality  Not Reported  Other  General Behavior: cooperative  Attire: appropriate  Gait: Not observed-telehealth  Motor Activity: normal  Stream of Thought - Productivity: spontaneous  Stream of thought - Progression: normal  Stream of thought - Language: normal  Emotional tone and reactions - Mood: normal  Emotional tone and reactions - Affect: appropriate  Mental trend/Content of thoughts - Perception: normal  Mental trend/Content of thoughts - Orientation: normal  Mental trend/Content of thoughts - Memory: normal  Mental trend/Content of thoughts - General knowledge: consistent with education  Insight: good  Judgment: good  Intelligence: high    Cynthia Lacy,  PhD               Cynthia Lacy, PhD

## 2024-06-02 ENCOUNTER — Ambulatory Visit (INDEPENDENT_AMBULATORY_CARE_PROVIDER_SITE_OTHER): Admitting: Clinical

## 2024-06-02 DIAGNOSIS — F4323 Adjustment disorder with mixed anxiety and depressed mood: Secondary | ICD-10-CM | POA: Diagnosis not present

## 2024-06-02 NOTE — Progress Notes (Signed)
 Diagnosis: F43.23 Time: 2:00 pm-3:00 pm CPT: 09162E-04  Cynthia [name] was seen remotely using secure video conferencing. She was in a parked car at [employer] [address], and the therapist was in her office at the time of the appointment. Client is aware of risks of telehealth and consented to a virtual visit. Session focused on continued stressors related to parenting challenges and developments in her children's health. Therapist offered validation and support, offering psychoeducation as appropriate. She is scheduled to be seen again in two weeks.  Treatment Plan Client Abilities/Strengths  Tracey is seeking CBT to help her cope with her daughter's ASD diagnosis and pursue appropriate treatments. She shared that she is also interested in regaining motivation to achieve previous levels of discipline in her lifestyle.  Client Treatment Preferences  Mizuki prefers remote appointments that occur while her children are in school, between 9:30am-12:00am  Client Statement of Needs  Siarra is seeking CBT to help her cope with and navigate her daughter's diagnosis, as well as establishing and maintaining a self-care routine  Treatment Level  Monthly  Symptoms  Anxiety: sleep irregularities, excessive worry (Status: maintained). Depression: frequent headaches, weight gain, fatigue, sleep difficulties (Status: maintained).  Problems Addressed  New Description, New Description  Goals 1. Leyani has struggled to maintain self care in recent years Objective Myrth would like to increase her understanding of her daughters in order to better address their needs Target Date: 2024-09-08 Frequency: Monthly  Progress: 70% one daughter, 25% other daughter Modality: individual  Related Interventions Therapist will incorporate manualized treatments including Parent Training for Disruptive Behaviors in ASD  Therapist will provide referrals for additional resources as appropriate  Objective Israa would like to  establish a consistent self care routine  Target Date: 2024-09-08 Frequency: Monthly  Progress: 30 Modality: individual  Related Interventions Therapist will incorporate CBT based strategies to help Gregoria identify and disengage from maladaptive thoughts and behaviors Katiya will be provided opportunities to process experiences in session 2. Vinia would like to increase self-accountability toward accomplishing set tasks Target date: 09/08/2024 Progress: 30%  3. Chelsee will process traumatic experiences from her past and consider how these experiences may continue to impact her currently.  Diagnosis Axis none 309.28 (Adjustment disorder with mixed anxiety and depressed mood) - Open - [Signifier: n/a]    Conditions For Discharge Achievement of treatment goals and objectives  ntake Presenting Problem Santiana presented seeking CBT to cope with her daughter's ASD diagnosis and navigate next steps in her care.  Symptoms excessive worry, weight gain, sleep irregularities, fatigue History of Problem  Kianna's 14-year-old daughter [name] was diagnosed with ASD in spring of 2022, and her twin daughter [name] was diagnosed the following year. She shared that it has been challenging to coordinate appropriate care and services for them.  Recent Trigger  Alahna's daughter was diagnosed with ASD in spring of 2022.  Marital and Family Information  Present family concerns/problems: None reported, although Carnesha would like more 1:1 time with her husband  Strengths/resources in the family/friends: Nieve described a great relationship with her husband  Marital/sexual history patterns: Lynessa has been married for 10 years and has been in a relationship with her husband for 23 years  Family of Origin  Problems in family of origin: Devynne shared that she has very few memories of her child until she was in the end of elementary school. Her family moved [place]  from [place] when she was 43 years of age. She  shared that she was cared  for primarily by a nanny in early childhood, as is typical of [place] culture, and her father traveled frequently for work.  Family background / ethnic factors: Raeleen is from [place]  No needs/concerns related to ethnicity reported when asked: No  Education/Vocation  Interpersonal concerns/problems: Jamari shared that she feels isolated since having children and social restrictions during the COVID19 pandemic. She moved to [facility] for her husband's job in 2014 and she shared that she does not have a significant support system in the area outside of two close friends who live in [place].  Personal strengths: Dorthia does not hold on to grudges and described herself as easy going.  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  Leisure Activities/Daily Functioning  gave up  Legal Status  No Legal Problems  Substance use/abuse/dependence  unspecified  Comments: Weight gain-Addylynn has participated the [facility] weight management program  Religion/Spirituality  Not Reported  Other  General Behavior: cooperative  Attire: appropriate  Gait: Not observed-telehealth  Motor Activity: normal  Stream of Thought - Productivity: spontaneous  Stream of thought - Progression: normal  Stream of thought - Language: normal  Emotional tone and reactions - Mood: normal  Emotional tone and reactions - Affect: appropriate  Mental trend/Content of thoughts - Perception: normal  Mental trend/Content of thoughts - Orientation: normal  Mental trend/Content of thoughts - Memory: normal  Mental trend/Content of thoughts - General knowledge: consistent with education  Insight: good  Judgment: good  Intelligence: high      [name], PhD               [name], PhD

## 2024-06-14 ENCOUNTER — Other Ambulatory Visit (HOSPITAL_COMMUNITY): Payer: Self-pay

## 2024-06-15 ENCOUNTER — Other Ambulatory Visit (HOSPITAL_COMMUNITY): Payer: Self-pay

## 2024-06-15 ENCOUNTER — Other Ambulatory Visit: Payer: Self-pay

## 2024-06-15 MED ORDER — ZEPBOUND 15 MG/0.5ML ~~LOC~~ SOAJ
15.0000 mg | SUBCUTANEOUS | 1 refills | Status: DC
  Filled 2024-06-15: qty 2, 28d supply, fill #0
  Filled 2024-07-13: qty 2, 28d supply, fill #1

## 2024-06-15 MED ORDER — SPIRONOLACTONE 50 MG PO TABS
50.0000 mg | ORAL_TABLET | Freq: Every day | ORAL | 3 refills | Status: DC
  Filled 2024-06-15: qty 90, 90d supply, fill #0
  Filled 2024-09-30 – 2024-10-26 (×2): qty 90, 90d supply, fill #1

## 2024-06-16 ENCOUNTER — Other Ambulatory Visit (HOSPITAL_COMMUNITY): Payer: Self-pay

## 2024-06-16 MED ORDER — TOPIRAMATE ER 100 MG PO SPRINKLE CAP24
100.0000 mg | EXTENDED_RELEASE_CAPSULE | Freq: Every day | ORAL | 3 refills | Status: DC
  Filled 2024-06-16: qty 90, 90d supply, fill #0
  Filled 2024-09-30 – 2024-10-26 (×2): qty 90, 90d supply, fill #1

## 2024-06-17 ENCOUNTER — Ambulatory Visit (INDEPENDENT_AMBULATORY_CARE_PROVIDER_SITE_OTHER): Admitting: Clinical

## 2024-06-17 ENCOUNTER — Other Ambulatory Visit (HOSPITAL_COMMUNITY): Payer: Self-pay

## 2024-06-17 DIAGNOSIS — F4323 Adjustment disorder with mixed anxiety and depressed mood: Secondary | ICD-10-CM | POA: Diagnosis not present

## 2024-06-17 NOTE — Progress Notes (Signed)
 Diagnosis: F43.23 Time: 9:00 am-10:00 am CPT: 09162E-04  Rhylie was seen remotely using secure video conferencing. She was in a parked car at [employer] ([facility] [address] [address]), and the therapist was in her office at the time of the appointment. Client is aware of risks of telehealth and consented to a virtual visit. Session focused on plans related to her children's education during the upcoming schoolyear. Therapist offered validation and support, as well as suggestions for navigating the school system. She is scheduled to be seen again in two weeks.  Treatment Plan Client Abilities/Strengths  Adalene is seeking CBT to help her cope with her daughter's ASD diagnosis and pursue appropriate treatments. She shared that she is also interested in regaining motivation to achieve previous levels of discipline in her lifestyle.  Client Treatment Preferences  Mireyah prefers remote appointments that occur while her children are in school, between 9:30am-12:00am  Client Statement of Needs  Audrionna is seeking CBT to help her cope with and navigate her daughter's diagnosis, as well as establishing and maintaining a self-care routine  Treatment Level  Monthly  Symptoms  Anxiety: sleep irregularities, excessive worry (Status: maintained). Depression: frequent headaches, weight gain, fatigue, sleep difficulties (Status: maintained).  Problems Addressed  New Description, New Description  Goals 1. Samona has struggled to maintain self care in recent years Objective Paylin would like to increase her understanding of her daughters in order to better address their needs Target Date: 2024-09-08 Frequency: Monthly  Progress: 70% one daughter, 25% other daughter Modality: individual  Related Interventions Therapist will incorporate manualized treatments including Parent Training for Disruptive Behaviors in ASD  Therapist will provide referrals for additional resources as appropriate   Objective Jeryl would like to establish a consistent self care routine  Target Date: 2024-09-08 Frequency: Monthly  Progress: 30 Modality: individual  Related Interventions Therapist will incorporate CBT based strategies to help Glendy identify and disengage from maladaptive thoughts and behaviors Dilara will be provided opportunities to process experiences in session 2. Ellieana would like to increase self-accountability toward accomplishing set tasks Target date: 09/08/2024 Progress: 30%  3. Jevon will process traumatic experiences from her past and consider how these experiences may continue to impact her currently.  Diagnosis Axis none 309.28 (Adjustment disorder with mixed anxiety and depressed mood) - Open - [Signifier: n/a]    Conditions For Discharge Achievement of treatment goals and objectives  ntake Presenting Problem Kimberlea presented seeking CBT to cope with her daughter's ASD diagnosis and navigate next steps in her care.  Symptoms excessive worry, weight gain, sleep irregularities, fatigue History of Problem  Mima's 55-year-old daughter [name] was diagnosed with ASD in spring of 2022, and her twin daughter [name] was diagnosed the following year. She shared that it has been challenging to coordinate appropriate care and services for them.  Recent Trigger  Mackena's daughter was diagnosed with ASD in spring of 2022.  Marital and Family Information  Present family concerns/problems: None reported, although Makala would like more 1:1 time with her husband  Strengths/resources in the family/friends: Merida described a great relationship with her husband  Marital/sexual history patterns: Sanaiyah has been married for 10 years and has been in a relationship with her husband for 23 years  Family of Origin  Problems in family of origin: Mona shared that she has very few memories of her child until she was in the end of elementary school. Her family moved to [place]  from [place]  when she was 43 years of age. She  shared that she was cared for primarily by a nanny in early childhood, as is typical of [place] culture, and her father traveled frequently for work.  Family background / ethnic factors: Kayonna is from [place]  No needs/concerns related to ethnicity reported when asked: No  Education/Vocation  Interpersonal concerns/problems: Chrishelle shared that she feels isolated since having children and social restrictions during the COVID19 pandemic. She moved to [facility] for her husband's job in 2014 and she shared that she does not have a significant support system in the area outside of two close friends who live in [place].  Personal strengths: Marirose does not hold on to grudges and described herself as easy going.  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  Leisure Activities/Daily Functioning  gave up  Legal Status  No Legal Problems  Substance use/abuse/dependence  unspecified  Comments: Weight gain-Cecilie has participated the [facility] weight management program  Religion/Spirituality  Not Reported  Other  General Behavior: cooperative  Attire: appropriate  Gait: Not observed-telehealth  Motor Activity: normal  Stream of Thought - Productivity: spontaneous  Stream of thought - Progression: normal  Stream of thought - Language: normal  Emotional tone and reactions - Mood: normal  Emotional tone and reactions - Affect: appropriate  Mental trend/Content of thoughts - Perception: normal  Mental trend/Content of thoughts - Orientation: normal  Mental trend/Content of thoughts - Memory: normal  Mental trend/Content of thoughts - General knowledge: consistent with education  Insight: good  Judgment: good  Intelligence: high     [name], PhD         [name], PhD

## 2024-06-19 ENCOUNTER — Other Ambulatory Visit (HOSPITAL_COMMUNITY): Payer: Self-pay

## 2024-06-30 ENCOUNTER — Ambulatory Visit (INDEPENDENT_AMBULATORY_CARE_PROVIDER_SITE_OTHER): Admitting: Clinical

## 2024-06-30 DIAGNOSIS — F4323 Adjustment disorder with mixed anxiety and depressed mood: Secondary | ICD-10-CM

## 2024-06-30 NOTE — Progress Notes (Signed)
 Diagnosis: F43.23 Time: 9:00 am-10:00 am CPT: 09162E-04  Cynthia [name] was seen remotely using secure video conferencing. She was in a parked car at [employer] ([facility] [address]), and the therapist was in her office at the time of the appointment. Client is aware of risks of telehealth and consented to a virtual visit. Session focused on continuing to process options and plans for each of her childrens' education and support. Therapist offered validation and support, and engaged Cynthia [name] in exploration of pros and cons of several possibilities. She is scheduled to be seen again in two weeks.  Treatment Plan Client Abilities/Strengths  Cynthia [name] is seeking CBT to help her cope with her daughter's ASD diagnosis and pursue appropriate treatments. She shared that she is also interested in regaining motivation to achieve previous levels of discipline in her lifestyle.  Client Treatment Preferences  Cynthia [name] prefers remote appointments that occur while her children are in school, between 9:30am-12:00am  Client Statement of Needs  Cynthia [name] is seeking CBT to help her cope with and navigate her daughter's diagnosis, as well as establishing and maintaining a self-care routine  Treatment Level  Monthly  Symptoms  Anxiety: sleep irregularities, excessive worry (Status: maintained). Depression: frequent headaches, weight gain, fatigue, sleep difficulties (Status: maintained).  Problems Addressed  New Description, New Description  Goals 1. Cynthia [name] has struggled to maintain self care in recent years Objective Cynthia [name] would like to increase her understanding of her daughters in order to better address their needs Target Date: 2024-09-08 Frequency: Monthly  Progress: 70% one daughter, 25% other daughter Modality: individual  Related Interventions Therapist will incorporate manualized treatments including Parent Training for Disruptive Behaviors in ASD  Therapist will provide referrals for additional  resources as appropriate  Objective Cynthia [name] would like to establish a consistent self care routine  Target Date: 2024-09-08 Frequency: Monthly  Progress: 30 Modality: individual  Related Interventions Therapist will incorporate CBT based strategies to help Cynthia [name] identify and disengage from maladaptive thoughts and behaviors Cynthia [name] will be provided opportunities to process experiences in session 2. Cynthia [name] would like to increase self-accountability toward accomplishing set tasks Target date: 09/08/2024 Progress: 30%  3. Cynthia [name] will process traumatic experiences from her past and consider how these experiences may continue to impact her currently.  Diagnosis Axis none 309.28 (Adjustment disorder with mixed anxiety and depressed mood) - Open - [Signifier: n/a]    Conditions For Discharge Achievement of treatment goals and objectives  ntake Presenting Problem Cynthia [name] presented seeking CBT to cope with her daughter's ASD diagnosis and navigate next steps in her care.  Symptoms excessive worry, weight gain, sleep irregularities, fatigue History of Problem  Cynthia [name]'s 18-year-old daughter Cynthia [name] was diagnosed with ASD in spring of 2022, and her twin daughter Cynthia [name] was diagnosed the following year. She shared that it has been challenging to coordinate appropriate care and services for them.  Recent Trigger  Cynthia [name]'s daughter was diagnosed with ASD in spring of 2022.  Marital and Family Information  Present family concerns/problems: None reported, although Cynthia [name] would like more 1:1 time with her husband  Strengths/resources in the family/friends: Cynthia [name] described a great relationship with her husband  Marital/sexual history patterns: Cynthia [name] has been married for 10 years and has been in a relationship with her husband for 23 years  Family of Origin  Problems in family of origin: Cynthia [name] shared that she has very few memories of her child until she was in the end of elementary school. Her family moved  to [place]  from [place] when  she was 43 years of age. She shared that she was cared for primarily by a nanny in early childhood, as is typical of [place] culture, and her father traveled frequently for work.  Family background / ethnic factors: Cynthia [name] is from [place]  No needs/concerns related to ethnicity reported when asked: No  Education/Vocation  Interpersonal concerns/problems: Cynthia [name] shared that she feels isolated since having children and social restrictions during the COVID19 pandemic. She moved to [facility] for her husband's job in 2014 and she shared that she does not have a significant support system in the area outside of two close friends who live in [place].  Personal strengths: Cynthia [name] does not hold on to grudges and described herself as easy going.  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  Leisure Activities/Daily Functioning  gave up  Legal Status  No Legal Problems  Substance use/abuse/dependence  unspecified  Comments: Weight gain-Cynthia [name] has participated the [facility] weight management program  Religion/Spirituality  Not Reported  Other  General Behavior: cooperative  Attire: appropriate  Gait: Not observed-telehealth  Motor Activity: normal  Stream of Thought - Productivity: spontaneous  Stream of thought - Progression: normal  Stream of thought - Language: normal  Emotional tone and reactions - Mood: normal  Emotional tone and reactions - Affect: appropriate  Mental trend/Content of thoughts - Perception: normal  Mental trend/Content of thoughts - Orientation: normal  Mental trend/Content of thoughts - Memory: normal  Mental trend/Content of thoughts - General knowledge: consistent with education  Insight: good  Judgment: good  Intelligence: high       [name],  PhD               [name], PhD

## 2024-07-13 ENCOUNTER — Other Ambulatory Visit: Payer: Self-pay

## 2024-07-13 ENCOUNTER — Other Ambulatory Visit (HOSPITAL_COMMUNITY): Payer: Self-pay

## 2024-07-13 ENCOUNTER — Encounter (HOSPITAL_COMMUNITY): Payer: Self-pay

## 2024-07-16 ENCOUNTER — Other Ambulatory Visit (HOSPITAL_COMMUNITY): Payer: Self-pay

## 2024-07-16 ENCOUNTER — Ambulatory Visit: Admitting: Clinical

## 2024-07-17 ENCOUNTER — Encounter (HOSPITAL_COMMUNITY): Payer: Self-pay

## 2024-07-17 ENCOUNTER — Other Ambulatory Visit (HOSPITAL_COMMUNITY): Payer: Self-pay

## 2024-08-03 ENCOUNTER — Other Ambulatory Visit (HOSPITAL_COMMUNITY): Payer: Self-pay

## 2024-08-03 ENCOUNTER — Encounter (HOSPITAL_COMMUNITY): Payer: Self-pay

## 2024-08-04 ENCOUNTER — Other Ambulatory Visit (HOSPITAL_COMMUNITY): Payer: Self-pay

## 2024-08-04 ENCOUNTER — Ambulatory Visit (INDEPENDENT_AMBULATORY_CARE_PROVIDER_SITE_OTHER): Admitting: Clinical

## 2024-08-04 DIAGNOSIS — F4323 Adjustment disorder with mixed anxiety and depressed mood: Secondary | ICD-10-CM

## 2024-08-04 NOTE — Progress Notes (Signed)
 Diagnosis: F43.23 Time: 9:00 am-10:00 am CPT: 09162E-04  Cynthia [name] was seen remotely using secure video conferencing. She was in a parked car at [employer] ([facility] [address]), and the therapist was in her office at the time of the appointment. Client is aware of risks of telehealth and consented to a virtual visit. Session focused on developments in terms of her children's care and educational setting. Therapist offered validation and support, suggesting resources as appropriate. Cynthia [name] is scheduled to be seen again in October for biweekly sessions, and will reach out if she needs to be seen sooner.  Treatment Plan Client Abilities/Strengths  Cynthia [name] is seeking CBT to help her cope with her daughter's ASD diagnosis and pursue appropriate treatments. She shared that she is also interested in regaining motivation to achieve previous levels of discipline in her lifestyle.  Client Treatment Preferences  Cynthia [name] prefers remote appointments that occur while her children are in school, between 9:30am-12:00am  Client Statement of Needs  Cynthia [name] is seeking CBT to help her cope with and navigate her daughter's diagnosis, as well as establishing and maintaining a self-care routine  Treatment Level  Monthly  Symptoms  Anxiety: sleep irregularities, excessive worry (Status: maintained). Depression: frequent headaches, weight gain, fatigue, sleep difficulties (Status: maintained).  Problems Addressed  New Description, New Description  Goals 1. Cynthia [name] has struggled to maintain self care in recent years Objective Cynthia [name] would like to increase her understanding of her daughters in order to better address their needs Target Date: 2024-09-08 Frequency: Monthly  Progress: 70% one daughter, 25% other daughter Modality: individual  Related Interventions Therapist will incorporate manualized treatments including Parent Training for Disruptive Behaviors in ASD  Therapist will provide referrals for  additional resources as appropriate  Objective Cynthia [name] would like to establish a consistent self care routine  Target Date: 2024-09-08 Frequency: Monthly  Progress: 30 Modality: individual  Related Interventions Therapist will incorporate CBT based strategies to help Cynthia [name] identify and disengage from maladaptive thoughts and behaviors Cynthia [name] will be provided opportunities to process experiences in session 2. Cynthia [name] would like to increase self-accountability toward accomplishing set tasks Target date: 09/08/2024 Progress: 30%  3. Cynthia [name] will process traumatic experiences from her past and consider how these experiences may continue to impact her currently.  Diagnosis Axis none 309.28 (Adjustment disorder with mixed anxiety and depressed mood) - Open - [Signifier: n/a]    Conditions For Discharge Achievement of treatment goals and objectives  ntake Presenting Problem Cynthia [name] presented seeking CBT to cope with her daughter's ASD diagnosis and navigate next steps in her care.  Symptoms excessive worry, weight gain, sleep irregularities, fatigue History of Problem  Cynthia [name]'s 65-year-old daughter Cynthia [name] was diagnosed with ASD in spring of 2022, and her twin daughter Cynthia [name] was diagnosed the following year. She shared that it has been challenging to coordinate appropriate care and services for them.  Recent Trigger  Cynthia [name]'s daughter was diagnosed with ASD in spring of 2022.  Marital and Family Information  Present family concerns/problems: None reported, although Cynthia [name] would like more 1:1 time with her husband  Strengths/resources in the family/friends: Cynthia [name] described a great relationship with her husband  Marital/sexual history patterns: Cynthia [name] has been married for 10 years and has been in a relationship with her husband for 23 years  Family of Origin  Problems in family of origin: Cynthia [name] shared that she has very few memories of her child until she was in the end of elementary school. Her  family moved to [place]  from [place]  when she was 43 years of age. She shared that she was cared for primarily by a nanny in early childhood, as is typical of [place] culture, and her father traveled frequently for work.  Family background / ethnic factors: Cynthia [name] is from [place]  No needs/concerns related to ethnicity reported when asked: No  Education/Vocation  Interpersonal concerns/problems: Cynthia [name] shared that she feels isolated since having children and social restrictions during the COVID19 pandemic. She moved to [name] for her husband's job in 2014 and she shared that she does not have a significant support system in the area outside of two close friends who live in [place].  Personal strengths: Cynthia [name] does not hold on to grudges and described herself as easy going.  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  Leisure Activities/Daily Functioning  gave up  Legal Status  No Legal Problems  Substance use/abuse/dependence  unspecified  Comments: Weight gain-Cynthia [name] has participated the [name] weight management program  Religion/Spirituality  Not Reported  Other  General Behavior: cooperative  Attire: appropriate  Gait: Not observed-telehealth  Motor Activity: normal  Stream of Thought - Productivity: spontaneous  Stream of thought - Progression: normal  Stream of thought - Language: normal  Emotional tone and reactions - Mood: normal  Emotional tone and reactions - Affect: appropriate  Mental trend/Content of thoughts - Perception: normal  Mental trend/Content of thoughts - Orientation: normal  Mental trend/Content of thoughts - Memory: normal  Mental trend/Content of thoughts - General knowledge: consistent with education  Insight: good  Judgment: good  Intelligence: high      [name],  PhD      [name], PhD

## 2024-08-06 ENCOUNTER — Other Ambulatory Visit (HOSPITAL_COMMUNITY): Payer: Self-pay

## 2024-08-06 DIAGNOSIS — H1045 Other chronic allergic conjunctivitis: Secondary | ICD-10-CM | POA: Diagnosis not present

## 2024-08-06 DIAGNOSIS — H04123 Dry eye syndrome of bilateral lacrimal glands: Secondary | ICD-10-CM | POA: Diagnosis not present

## 2024-08-06 DIAGNOSIS — F908 Attention-deficit hyperactivity disorder, other type: Secondary | ICD-10-CM | POA: Diagnosis not present

## 2024-08-06 DIAGNOSIS — E66811 Obesity, class 1: Secondary | ICD-10-CM | POA: Diagnosis not present

## 2024-08-06 DIAGNOSIS — I10 Essential (primary) hypertension: Secondary | ICD-10-CM | POA: Diagnosis not present

## 2024-08-06 DIAGNOSIS — F50819 Binge eating disorder, unspecified: Secondary | ICD-10-CM | POA: Diagnosis not present

## 2024-08-06 DIAGNOSIS — Z6830 Body mass index (BMI) 30.0-30.9, adult: Secondary | ICD-10-CM | POA: Diagnosis not present

## 2024-08-06 MED ORDER — LISDEXAMFETAMINE DIMESYLATE 40 MG PO CAPS
40.0000 mg | ORAL_CAPSULE | Freq: Every day | ORAL | 0 refills | Status: DC
  Filled 2024-08-06: qty 30, 30d supply, fill #0

## 2024-08-06 MED ORDER — LISDEXAMFETAMINE DIMESYLATE 20 MG PO CAPS
20.0000 mg | ORAL_CAPSULE | Freq: Every day | ORAL | 0 refills | Status: DC
  Filled 2024-08-06: qty 30, 30d supply, fill #0

## 2024-08-06 MED ORDER — FLUOROMETHOLONE 0.1 % OP SUSP
1.0000 [drp] | Freq: Four times a day (QID) | OPHTHALMIC | 0 refills | Status: DC
  Filled 2024-08-06: qty 5, 25d supply, fill #0

## 2024-08-06 MED ORDER — ZEPBOUND 15 MG/0.5ML ~~LOC~~ SOAJ
15.0000 mg | SUBCUTANEOUS | 1 refills | Status: DC
  Filled 2024-08-06: qty 2, 28d supply, fill #0
  Filled 2024-09-03 – 2024-09-22 (×2): qty 2, 28d supply, fill #1

## 2024-08-11 ENCOUNTER — Other Ambulatory Visit (HOSPITAL_COMMUNITY): Payer: Self-pay

## 2024-09-03 ENCOUNTER — Other Ambulatory Visit (HOSPITAL_COMMUNITY): Payer: Self-pay

## 2024-09-14 ENCOUNTER — Ambulatory Visit (INDEPENDENT_AMBULATORY_CARE_PROVIDER_SITE_OTHER): Admitting: Clinical

## 2024-09-14 DIAGNOSIS — F4323 Adjustment disorder with mixed anxiety and depressed mood: Secondary | ICD-10-CM

## 2024-09-14 NOTE — Progress Notes (Signed)
 Diagnosis: F43.23 Time: 1:00 pm-2:00 pm CPT: 09162E-04  Jamy was seen remotely using secure video conferencing. She was in her home and the therapist was in her office at the time of the appointment. Client is aware of risks of telehealth and consented to a virtual visit. Session focused on recent stressors, which included caring for her mother and mother in law, and a challenging conversation with her husband. Therapist offered validation and support, and suggested communication strategies. Ellionna is scheduled to be seen again in one month, and therapist will reach out as cancellations happen.  Treatment Plan Client Abilities/Strengths  Deneka is seeking CBT to help her cope with her daughter's ASD diagnosis and pursue appropriate treatments. She shared that she is also interested in regaining motivation to achieve previous levels of discipline in her lifestyle.  Client Treatment Preferences  Latina prefers remote appointments that occur while her children are in school, between 9:30am-12:00am  Client Statement of Needs  Blaike is seeking CBT to help her cope with and navigate her daughter's diagnosis, as well as establishing and maintaining a self-care routine  Treatment Level  Monthly  Symptoms  Anxiety: sleep irregularities, excessive worry (Status: maintained). Depression: frequent headaches, weight gain, fatigue, sleep difficulties (Status: maintained).  Problems Addressed  New Description, New Description  Goals 1. Elice has struggled to maintain self care in recent years Objective Rosaland would like to increase her understanding of her daughters in order to better address their needs Target Date: 2025-09-08 Frequency: Monthly  Progress: 70% one daughter, 25% other daughter Modality: individual  Related Interventions Therapist will incorporate manualized treatments including Parent Training for Disruptive Behaviors in ASD  Therapist will provide referrals for additional resources  as appropriate  Objective Kyley would like to establish a consistent self care routine  Target Date: 2025-09-08 Frequency: Monthly  Progress: 30 Modality: individual  Related Interventions Therapist will incorporate CBT based strategies to help Kerria identify and disengage from maladaptive thoughts and behaviors Zuleyka will be provided opportunities to process experiences in session 2. Christie would like to increase self-accountability toward accomplishing set tasks Target date: 09/08/2025 Progress: 30%  3. Vinisha will process traumatic experiences from her past and consider how these experiences may continue to impact her currently.  Diagnosis Axis none 309.28 (Adjustment disorder with mixed anxiety and depressed mood) - Open - [Signifier: n/a]    Conditions For Discharge Achievement of treatment goals and objectives  ntake Presenting Problem Ayrabella presented seeking CBT to cope with her daughter's ASD diagnosis and navigate next steps in her care.  Symptoms excessive worry, weight gain, sleep irregularities, fatigue History of Problem  Mckenleigh's 54-year-old daughter [name] was diagnosed with ASD in spring of 2022, and her twin daughter [name] was diagnosed the following year. She shared that it has been challenging to coordinate appropriate care and services for them.  Recent Trigger  Divya's daughter was diagnosed with ASD in spring of 2022.  Marital and Family Information  Present family concerns/problems: None reported, although Danial would like more 1:1 time with her husband  Strengths/resources in the family/friends: Manda described a great relationship with her husband  Marital/sexual history patterns: Emaleigh has been married for 10 years and has been in a relationship with her husband for 23 years  Family of Origin  Problems in family of origin: Berenize shared that she has very few memories of her child until she was in the end of elementary school. Her [place] moved to [place]   from [place] when she was 43 years  of age. She shared that she was cared for primarily by a nanny in early childhood, as is typical of [place] culture, and her father traveled frequently for work.  Family background / ethnic factors: Kymari is from [place]  No needs/concerns related to ethnicity reported when asked: No  Education/Vocation  Interpersonal concerns/problems: Ermelinda shared that she feels isolated since having children and social restrictions during the COVID19 pandemic. She moved to [place] [facility] for her husband's job in 2014 and she shared that she does not have a significant support system in the area outside of two close friends who live in [place].  Personal strengths: Lavetta does not hold on to grudges and described herself as easy going.  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  Leisure Activities/Daily Functioning  gave up  Legal Status  No Legal Problems  Substance use/abuse/dependence  unspecified  Comments: Weight gain-Anagabriela has participated the [facility] weight management program  Religion/Spirituality  Not Reported  Other  General Behavior: cooperative  Attire: appropriate  Gait: Not observed-telehealth  Motor Activity: normal  Stream of Thought - Productivity: spontaneous  Stream of thought - Progression: normal  Stream of thought - Language: normal  Emotional tone and reactions - Mood: normal  Emotional tone and reactions - Affect: appropriate  Mental trend/Content of thoughts - Perception: normal  Mental trend/Content of thoughts - Orientation: normal  Mental trend/Content of thoughts - Memory: normal  Mental trend/Content of thoughts - General knowledge: consistent with education  Insight: good  Judgment: good  Intelligence: high      [name], PhD      [name],  PhD               [name], PhD

## 2024-09-15 ENCOUNTER — Other Ambulatory Visit (HOSPITAL_COMMUNITY): Payer: Self-pay

## 2024-09-22 ENCOUNTER — Encounter (HOSPITAL_COMMUNITY): Payer: Self-pay

## 2024-09-22 ENCOUNTER — Other Ambulatory Visit (INDEPENDENT_AMBULATORY_CARE_PROVIDER_SITE_OTHER): Payer: Self-pay | Admitting: Family Medicine

## 2024-09-22 ENCOUNTER — Other Ambulatory Visit (HOSPITAL_COMMUNITY): Payer: Self-pay

## 2024-09-28 ENCOUNTER — Ambulatory Visit: Admitting: Clinical

## 2024-09-28 ENCOUNTER — Other Ambulatory Visit (HOSPITAL_COMMUNITY): Payer: Self-pay

## 2024-09-30 ENCOUNTER — Other Ambulatory Visit (HOSPITAL_COMMUNITY): Payer: Self-pay

## 2024-09-30 ENCOUNTER — Telehealth: Payer: Self-pay

## 2024-09-30 DIAGNOSIS — F50819 Binge eating disorder, unspecified: Secondary | ICD-10-CM | POA: Diagnosis not present

## 2024-09-30 DIAGNOSIS — Z8639 Personal history of other endocrine, nutritional and metabolic disease: Secondary | ICD-10-CM | POA: Diagnosis not present

## 2024-09-30 DIAGNOSIS — Z6829 Body mass index (BMI) 29.0-29.9, adult: Secondary | ICD-10-CM | POA: Diagnosis not present

## 2024-09-30 DIAGNOSIS — I10 Essential (primary) hypertension: Secondary | ICD-10-CM | POA: Diagnosis not present

## 2024-09-30 DIAGNOSIS — E663 Overweight: Secondary | ICD-10-CM | POA: Diagnosis not present

## 2024-09-30 MED ORDER — LISDEXAMFETAMINE DIMESYLATE 40 MG PO CAPS
40.0000 mg | ORAL_CAPSULE | ORAL | 0 refills | Status: DC
  Filled 2024-09-30 – 2024-10-26 (×2): qty 30, 30d supply, fill #0

## 2024-09-30 MED ORDER — LISDEXAMFETAMINE DIMESYLATE 20 MG PO CAPS
20.0000 mg | ORAL_CAPSULE | Freq: Every day | ORAL | 0 refills | Status: DC
  Filled 2024-09-30 – 2024-10-26 (×2): qty 30, 30d supply, fill #0

## 2024-09-30 NOTE — Telephone Encounter (Signed)
 Noted

## 2024-09-30 NOTE — Telephone Encounter (Signed)
**Note De-identified  [place] Obfuscation** Please advise 

## 2024-09-30 NOTE — Telephone Encounter (Signed)
Ok to transfer.  I wish her the best 

## 2024-09-30 NOTE — Telephone Encounter (Signed)
 Copied from CRM #[id number]. Topic: Appointments - Transfer of Care >> Sep 30, 2024  9:32 AM Vena HERO wrote: Pt is requesting to transfer FROM: Cynthia [name] Pt is requesting to transfer TO: [name] Reason for requested transfer: knows dr wallace It is the responsibility of the team the patient would like to transfer to (Dr. Shutter) to reach out to the patient if for any reason this transfer is not acceptable.

## 2024-10-01 ENCOUNTER — Encounter: Payer: Self-pay | Admitting: Pharmacist

## 2024-10-01 ENCOUNTER — Other Ambulatory Visit: Payer: Self-pay

## 2024-10-05 ENCOUNTER — Other Ambulatory Visit (HOSPITAL_COMMUNITY): Payer: Self-pay

## 2024-10-06 ENCOUNTER — Other Ambulatory Visit: Payer: Self-pay

## 2024-10-12 ENCOUNTER — Other Ambulatory Visit (HOSPITAL_COMMUNITY): Payer: Self-pay

## 2024-10-12 ENCOUNTER — Ambulatory Visit: Admitting: Clinical

## 2024-10-12 DIAGNOSIS — F4323 Adjustment disorder with mixed anxiety and depressed mood: Secondary | ICD-10-CM | POA: Diagnosis not present

## 2024-10-12 NOTE — Progress Notes (Signed)
 Diagnosis: F43.23 Time: 1:00 pm-1:56 pm CPT: 09162E-04  Cynthia [name] was seen remotely using secure video conferencing. She was in her home and the therapist was in her office at the time of the appointment. Client is aware of risks of telehealth and consented to a virtual visit. Video connectivity went out about 10 minutes into session, and session continued with audio only. Cynthia [name] reported upon a series of stressors since her last appointment, including her mother having a stroke that revealed serious underlying cardiac issues. Therapist offered an opportunity to process, providing validation and support. She is scheduled to be seen again in two weeks.  Treatment Plan Client Abilities/Strengths  Cynthia [name] is seeking CBT to help her cope with her daughter's ASD diagnosis and pursue appropriate treatments. She shared that she is also interested in regaining motivation to achieve previous levels of discipline in her lifestyle.  Client Treatment Preferences  Cynthia [name] prefers remote appointments that occur while her children are in school, between 9:30am-12:00am  Client Statement of Needs  Cynthia [name] is seeking CBT to help her cope with and navigate her daughter's diagnosis, as well as establishing and maintaining a self-care routine  Treatment Level  Monthly  Symptoms  Anxiety: sleep irregularities, excessive worry (Status: maintained). Depression: frequent headaches, weight gain, fatigue, sleep difficulties (Status: maintained).  Problems Addressed  New Description, New Description  Goals 1. Cynthia [name] has struggled to maintain self care in recent years Objective Cynthia [name] would like to increase her understanding of her daughters in order to better address their needs Target Date: 2025-09-08 Frequency: Monthly  Progress: 70% one daughter, 25% other daughter Modality: individual  Related Interventions Therapist will incorporate manualized treatments including Parent Training for Disruptive Behaviors in ASD   Therapist will provide referrals for additional resources as appropriate  Objective Cynthia [name] would like to establish a consistent self care routine  Target Date: 2025-09-08 Frequency: Monthly  Progress: 30 Modality: individual  Related Interventions Therapist will incorporate CBT based strategies to help Cynthia [name] identify and disengage from maladaptive thoughts and behaviors Cynthia [name] will be provided opportunities to process experiences in session 2. Cynthia [name] would like to increase self-accountability toward accomplishing set tasks Target date: 09/08/2025 Progress: 30%  3. Cynthia [name] will process traumatic experiences from her past and consider how these experiences may continue to impact her currently.  Diagnosis Axis none 309.28 (Adjustment disorder with mixed anxiety and depressed mood) - Open - [Signifier: n/a]    Conditions For Discharge Achievement of treatment goals and objectives  ntake Presenting Problem Cynthia [name] presented seeking CBT to cope with her daughter's ASD diagnosis and navigate next steps in her care.  Symptoms excessive worry, weight gain, sleep irregularities, fatigue History of Problem  Cynthia [name]'s 61-year-old daughter Cynthia [name] was diagnosed with ASD in spring of 2022, and her twin daughter Cynthia [name] was diagnosed the following year. She shared that it has been challenging to coordinate appropriate care and services for them.  Recent Trigger  Cynthia [name]'s daughter was diagnosed with ASD in spring of 2022.  Marital and Family Information  Present family concerns/problems: None reported, although Cynthia [name] would like more 1:1 time with her husband  Strengths/resources in the family/friends: Cynthia [name] described a great relationship with her husband  Marital/sexual history patterns: Cynthia [name] has been married for 10 years and has been in a relationship with her husband for 23 years  Family of Origin  Problems in family of origin: Cynthia [name] shared that she has very few memories of her child until she was  in the end of elementary school. Her family moved [place]  from [place] when she was 43 years of age. She shared that she was cared for primarily by a nanny in early childhood, as is typical of [place] culture, and her father traveled frequently for work.  Family background / ethnic factors: Cynthia [name] is from [place]  No needs/concerns related to ethnicity reported when asked: No  Education/Vocation  Interpersonal concerns/problems: Cynthia [name] shared that she feels isolated since having children and social restrictions during the COVID19 pandemic. She moved to [facility] for her husband's job in 2014 and she shared that she does not have a significant support system in the area outside of two close friends who live in [place].  Personal strengths: Cynthia [name] does not hold on to grudges and described herself as easy going.  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  Leisure Activities/Daily Functioning  gave up  Legal Status  No Legal Problems  Substance use/abuse/dependence  unspecified  Comments: Weight gain-Cynthia [name] has participated the [facility] weight management program  Religion/Spirituality  Not Reported  Other  General Behavior: cooperative  Attire: appropriate  Gait: Not observed-telehealth  Motor Activity: normal  Stream of Thought - Productivity: spontaneous  Stream of thought - Progression: normal  Stream of thought - Language: normal  Emotional tone and reactions - Mood: normal  Emotional tone and reactions - Affect: appropriate  Mental trend/Content of thoughts - Perception: normal  Mental trend/Content of thoughts - Orientation: normal  Mental trend/Content of thoughts - Memory: normal  Mental trend/Content of thoughts - General knowledge: consistent with education  Insight: good  Judgment: good  Intelligence: high       Cynthia LITTIE Ponto, Cynthia [name]      Cynthia LITTIE Ponto, Cynthia [name]               Cynthia [name], PhDDiagnosis: Q56.76 Time: 1:00 pm-2:00 pm CPT: 09162E-04  Makeisha was seen remotely using secure video conferencing. She was in her home and the therapist was in her office at the time of the appointment. Client is aware of risks of telehealth and consented to a virtual visit. Session focused on recent stressors, which included caring for her mother and mother in law, and a challenging conversation with her husband. Therapist offered validation and support, and suggested communication strategies. Adelyn is scheduled to be seen again in one month, and therapist will reach out as cancellations happen.  Treatment Plan Client Abilities/Strengths  Tauna is seeking CBT to help her cope with her daughter's ASD diagnosis and pursue appropriate treatments. She shared that she is also interested in regaining motivation to achieve previous levels of discipline in her lifestyle.  Client Treatment Preferences  Adelfa prefers remote appointments that occur while her children are in school, between 9:30am-12:00am  Client Statement of Needs  Fareeha is seeking CBT to help her cope with and navigate her daughter's diagnosis, as well as establishing and maintaining a self-care routine  Treatment Level  Monthly  Symptoms  Anxiety: sleep irregularities, excessive worry (Status: maintained). Depression: frequent headaches, weight gain, fatigue, sleep difficulties (Status: maintained).  Problems Addressed  New Description, New Description  Goals 1. Natali has struggled to maintain self care in recent years Objective Taiwana would like to increase her understanding of her daughters in order to better address their needs Target Date: 2025-09-08 Frequency: Monthly  Progress: 70% one daughter, 25% other daughter Modality: individual  Related Interventions Therapist will incorporate manualized treatments  including Parent Training for Disruptive Behaviors in ASD  Therapist will provide referrals for additional resources as appropriate  Objective Bunny would like to establish a consistent self care routine  Target Date: 2025-09-08 Frequency: Monthly  Progress: 30 Modality: individual  Related Interventions Therapist will incorporate CBT based strategies to help Jamyla identify and disengage from maladaptive thoughts and behaviors Rashida will be provided opportunities to process experiences in session 2. Corianna would like to increase self-accountability toward accomplishing set tasks Target date: 09/08/2025 Progress: 30%  3. Kessie will process traumatic experiences from her past and consider how these experiences may continue to impact her currently.  Diagnosis Axis none 309.28 (Adjustment disorder with mixed anxiety and depressed mood) - Open - [Signifier: n/a]    Conditions For Discharge Achievement of treatment goals and objectives  ntake Presenting Problem Margree presented seeking CBT to cope with her daughter's ASD diagnosis and navigate next steps in her care.  Symptoms excessive worry, weight gain, sleep irregularities, fatigue History of Problem  Nella's 1-year-old daughter Cynthia [name] was diagnosed with ASD in spring of 2022, and her twin daughter Cynthia [name] was diagnosed the following year. She shared that it has been challenging to coordinate appropriate care and services for them.  Recent Trigger  Sandhya's daughter was diagnosed with ASD in spring of 2022.  Marital and Family Information  Present family concerns/problems: None reported, although Diantha would like more 1:1 time with her husband  Strengths/resources in the family/friends: Zilda described a great relationship with her husband  Marital/sexual history patterns: Yoshie has been married for 10 years and has been in a relationship with her husband for 23 years  Family of Origin  Problems in family of origin: Siani shared  that she has very few memories of her child until she was in the end of elementary school. Her family [place]  from [place] when she was 43 years of age. She shared that she was cared for primarily by a nanny in early childhood, as is typical of [place] culture, and her father traveled frequently for work.  Family background / ethnic factors: Aashvi is from [place]  No needs/concerns related to ethnicity reported when asked: No  Education/Vocation  Interpersonal concerns/problems: Kaliegh shared that she feels isolated since having children and social restrictions during the COVID19 pandemic. She moved to [facility] for her husband's job in 2014 and she shared that she does not have a significant support system in the area outside of two close friends who live in [place].  Personal strengths: Cindie does not hold on to grudges and described herself as easy going.  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  Leisure Activities/Daily Functioning  gave up  Legal Status  No Legal Problems  Substance use/abuse/dependence  unspecified  Comments: Weight gain-Noemie has participated the [facility] weight management program  Religion/Spirituality  Not Reported  Other  General Behavior: cooperative  Attire: appropriate  Gait: Not observed-telehealth  Motor Activity: normal  Stream of Thought - Productivity: spontaneous  Stream of thought - Progression: normal  Stream of thought - Language: normal  Emotional tone and reactions - Mood: normal  Emotional tone and reactions - Affect: appropriate  Mental trend/Content of thoughts - Perception: normal  Mental trend/Content of thoughts - Orientation: normal  Mental trend/Content of thoughts -  Memory: normal  Mental trend/Content of thoughts - General knowledge: consistent with education   Insight: good  Judgment: good  Intelligence: high     Cynthia LITTIE Ponto, Cynthia [name]               Cynthia LITTIE Ponto, Cynthia [name]

## 2024-10-26 ENCOUNTER — Ambulatory Visit: Admitting: Clinical

## 2024-10-26 ENCOUNTER — Other Ambulatory Visit (HOSPITAL_COMMUNITY): Payer: Self-pay

## 2024-10-26 DIAGNOSIS — F4323 Adjustment disorder with mixed anxiety and depressed mood: Secondary | ICD-10-CM | POA: Diagnosis not present

## 2024-10-26 NOTE — Progress Notes (Signed)
 Diagnosis: F43.23 Time: 1:00 pm-1:56 pm CPT: 09162E-04  Merina was seen remotely using secure video conferencing. She was in a secure location at [facility] and the therapist was in her office at the time of the appointment. Client is aware of risks of telehealth and consented to a virtual visit. Session focused on processing Nimah's memories of her childhood in light of her experience with her mother living with her so far. Therapist suggested the book, Adult Children of Emotionally Immature Parents. She is scheduled to be seen again in two weeks.  Treatment Plan Client Abilities/Strengths  Mosetta is seeking CBT to help her cope with her daughter's ASD diagnosis and pursue appropriate treatments. She shared that she is also interested in regaining motivation to achieve previous levels of discipline in her lifestyle.  Client Treatment Preferences  Dominica prefers remote appointments that occur while her children are in school, between 9:30am-12:00am  Client Statement of Needs  Matricia is seeking CBT to help her cope with and navigate her daughter's diagnosis, as well as establishing and maintaining a self-care routine  Treatment Level  Monthly  Symptoms  Anxiety: sleep irregularities, excessive worry (Status: maintained). Depression: frequent headaches, weight gain, fatigue, sleep difficulties (Status: maintained).  Problems Addressed  New Description, New Description  Goals 1. Demiya has struggled to maintain self care in recent years Objective Shyanne would like to increase her understanding of her daughters in order to better address their needs Target Date: 2025-09-08 Frequency: Monthly  Progress: 70% one daughter, 25% other daughter Modality: individual  Related Interventions Therapist will incorporate manualized treatments including Parent Training for Disruptive Behaviors in ASD  Therapist will provide referrals for additional resources as appropriate  Objective Emersen  would like to establish a consistent self care routine  Target Date: 2025-09-08 Frequency: Monthly  Progress: 30 Modality: individual  Related Interventions Therapist will incorporate CBT based strategies to help Rex identify and disengage from maladaptive thoughts and behaviors Aisha will be provided opportunities to process experiences in session 2. Jaretssi would like to increase self-accountability toward accomplishing set tasks Target date: 09/08/2025 Progress: 30%  3. [name] will process traumatic experiences from her past and consider how these experiences may continue to impact her currently.  Diagnosis Axis none 309.28 (Adjustment disorder with mixed anxiety and depressed mood) - Open - [Signifier: n/a]    Conditions For Discharge Achievement of treatment goals and objectives  ntake Presenting Problem Diamonds presented seeking CBT to cope with her daughter's ASD diagnosis and navigate next steps in her care.  Symptoms excessive worry, weight gain, sleep irregularities, fatigue History of Problem  Haylie's 51-year-old daughter [name] was diagnosed with ASD in spring of 2022, and her twin daughter [name] was diagnosed the following year. She shared that it has been challenging to coordinate appropriate care and services for them.  Recent Trigger  Adryana's daughter was diagnosed with ASD in spring of 2022.  Marital and Family Information  Present family concerns/problems: None reported, although Janice would like more 1:1 time with her husband  Strengths/resources in the family/friends: Kemia described a great relationship with her husband  Marital/sexual history patterns: Avereigh has been married for 10 years and has been in a relationship with her husband for 23 years  Family of Origin  Problems in family of origin: Christalynn shared that she has very few memories of her child until she was in the end of elementary school. Her family moved to [place]  from [place] when she was 43 years  of age.  She shared that she was cared for primarily by a nanny in early childhood, as is typical of [place] culture, and her father traveled frequently for work.  Family background / ethnic factors: Yarithza is from [place]  No needs/concerns related to ethnicity reported when asked: No  Education/Vocation  Interpersonal concerns/problems: Leeyah shared that she feels isolated since having children and social restrictions during the COVID19 pandemic. She moved to [facility] [place] for her husband's job in 2014 and she shared that she does not have a significant support system in the area outside of two close friends who live in [place].  Personal strengths: Quentina does not hold on to grudges and described herself as easy going.  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  Leisure Activities/Daily Functioning  gave up  Legal Status  No Legal Problems  Substance use/abuse/dependence  unspecified  Comments: Weight gain-Haille has participated the [facility] weight management program  Religion/Spirituality  Not Reported  Other  General Behavior: cooperative  Attire: appropriate  Gait: Not observed-telehealth  Motor Activity: normal  Stream of Thought - Productivity: spontaneous  Stream of thought - Progression: normal  Stream of thought - Language: normal  Emotional tone and reactions - Mood: normal  Emotional tone and reactions - Affect: appropriate  Mental trend/Content of thoughts - Perception: normal  Mental trend/Content of thoughts - Orientation: normal  Mental trend/Content of thoughts - Memory: normal  Mental trend/Content of thoughts - General knowledge: consistent with education  Insight: good  Judgment: good  Intelligence: high      [name], PhD      [name], PhD               [name], PhDDiagnosis: Q56.76 Time: 1:00 pm-2:00 pm CPT: 09162E-04  Marlayna was seen remotely using secure video conferencing. She was in her home and the therapist was in her office at the time of the appointment. Client is aware of risks of telehealth and consented to a virtual visit. Session focused on recent stressors, which included caring for her mother and mother in law, and a challenging conversation with her husband. Therapist offered validation and support, and suggested communication strategies. Ceyda is scheduled to be seen again in one month, and therapist will reach out as cancellations happen.  Treatment Plan Client Abilities/Strengths  Fynn is seeking CBT to help her cope with her daughter's ASD diagnosis and pursue appropriate treatments. She shared that she is also interested in regaining motivation to achieve previous levels of discipline in her lifestyle.  Client Treatment Preferences  Doretta prefers remote appointments that occur while her children are in school, between 9:30am-12:00am  Client Statement of Needs  Meghana is seeking CBT to help her cope with and navigate her daughter's diagnosis, as well as establishing and maintaining a self-care routine  Treatment Level  Monthly  Symptoms  Anxiety: sleep irregularities, excessive worry (Status: maintained). Depression: frequent headaches, weight gain, fatigue, sleep difficulties (Status: maintained).  Problems Addressed  New Description, New Description  Goals 1. Elany has struggled to maintain self care in recent years Objective Fantashia would like to increase her understanding of her daughters in order to better address their needs Target Date: 2025-09-08 Frequency: Monthly  Progress: 70% one daughter, 25% other daughter Modality: individual  Related Interventions Therapist will incorporate manualized treatments including Parent Training  for Disruptive Behaviors in ASD  Therapist will provide referrals for  additional resources as appropriate  Objective Arpi would like to establish a consistent self care routine  Target Date: 2025-09-08 Frequency: Monthly  Progress: 30 Modality: individual  Related Interventions Therapist will incorporate CBT based strategies to help Kenyata identify and disengage from maladaptive thoughts and behaviors Kiahna will be provided opportunities to process experiences in session 2. Anquinette would like to increase self-accountability toward accomplishing set tasks Target date: 09/08/2025 Progress: 30%  3. Tishina will process traumatic experiences from her past and consider how these experiences may continue to impact her currently.  Diagnosis Axis none 309.28 (Adjustment disorder with mixed anxiety and depressed mood) - Open - [Signifier: n/a]    Conditions For Discharge Achievement of treatment goals and objectives  ntake Presenting Problem Shriya presented seeking CBT to cope with her daughter's ASD diagnosis and navigate next steps in her care.  Symptoms excessive worry, weight gain, sleep irregularities, fatigue History of Problem  Contessa's 34-year-old daughter [name] was diagnosed with ASD in spring of 2022, and her twin daughter [name] was diagnosed the following year. She shared that it has been challenging to coordinate appropriate care and services for them.  Recent Trigger  Lutisha's daughter was diagnosed with ASD in spring of 2022.  Marital and Family Information  Present family concerns/problems: None reported, although Zauria would like more 1:1 time with her husband  Strengths/resources in the family/friends: Leeanna described a great relationship with her husband  Marital/sexual history patterns: Summit has been married for 10 years and has been in a relationship with her husband for 23 years  Family of Origin  Problems in family of origin: Darrielle shared that she has very few memories of her child until she was in the end of elementary school. Her  family moved to [place]  from [place] when she was 43 years of age. She shared that she was cared for primarily by a nanny in early childhood, as is typical of [place] culture, and her father traveled frequently for work.  Family background / ethnic factors: Naomy is from [place]  No needs/concerns related to ethnicity reported when asked: No  Education/Vocation  Interpersonal concerns/problems: Shaletta shared that she feels isolated since having children and social restrictions during the COVID19 pandemic. She moved to [place] [facility] for her husband's job in 2014 and she shared that she does not have a significant support system in the area outside of two close friends who live in [place].  Personal strengths: Anslee does not hold on to grudges and described herself as easy going.  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  Leisure Activities/Daily Functioning  gave up  Legal Status  No Legal Problems  Substance use/abuse/dependence  unspecified  Comments: Weight gain-Azharia has participated the [facility] weight management program  Religion/Spirituality  Not Reported  Other  General Behavior: cooperative  Attire: appropriate  Gait: Not observed-telehealth  Motor Activity: normal  Stream of Thought - Productivity: spontaneous  Stream of thought - Progression: normal  Stream of thought - Language: normal  Emotional tone and reactions - Mood: normal  Emotional tone and reactions - Affect: appropriate  Mental trend/Content of thoughts - Perception: normal  Mental trend/Content of thoughts - Orientation: normal  Mental trend/Content of thoughts - Memory: normal  Mental trend/Content of thoughts - General knowledge:  consistent with education  Insight: good  Judgment: good  Intelligence: high     [name], PhD

## 2024-11-09 ENCOUNTER — Ambulatory Visit: Admitting: Clinical

## 2024-11-09 DIAGNOSIS — F4323 Adjustment disorder with mixed anxiety and depressed mood: Secondary | ICD-10-CM

## 2024-11-09 NOTE — Progress Notes (Signed)
 Diagnosis: F43.23 Time: 1:00 pm-1:56 pm CPT: 09162E-04  Mirenda was seen remotely using secure video conferencing. She was in her home and the therapist was in her office at the time of the appointment. Client is aware of risks of telehealth and consented to a virtual visit. Session focused on dynamics in Posey's relationships, considering them in light of traumatic experiences from her childhood. Therapist suggested exploring options to allow her to have a sense of self and control in her life, including participating in a structured hobby or even looking for employment. She is scheduled to be seen again in two weeks.  Treatment Plan Client Abilities/Strengths  Marisela is seeking CBT to help her cope with her daughter's ASD diagnosis and pursue appropriate treatments. She shared that she is also interested in regaining motivation to achieve previous levels of discipline in her lifestyle.  Client Treatment Preferences  Deshea prefers remote appointments that occur while her children are in school, between 9:30am-12:00am  Client Statement of Needs  Onica is seeking CBT to help her cope with and navigate her daughter's diagnosis, as well as establishing and maintaining a self-care routine  Treatment Level  Monthly  Symptoms  Anxiety: sleep irregularities, excessive worry (Status: maintained). Depression: frequent headaches, weight gain, fatigue, sleep difficulties (Status: maintained).  Problems Addressed  New Description, New Description  Goals 1. Damiah has struggled to maintain self care in recent years Objective Ivelisse would like to increase her understanding of her daughters in order to better address their needs Target Date: 2025-09-08 Frequency: Monthly  Progress: 70% one daughter, 25% other daughter Modality: individual  Related Interventions Therapist will incorporate manualized treatments including Parent Training for Disruptive Behaviors in ASD  Therapist will provide referrals  for additional resources as appropriate  Objective Betina would like to establish a consistent self care routine  Target Date: 2025-09-08 Frequency: Monthly  Progress: 30 Modality: individual  Related Interventions Therapist will incorporate CBT based strategies to help Ginnie identify and disengage from maladaptive thoughts and behaviors Kijana will be provided opportunities to process experiences in session 2. Daniella would like to increase self-accountability toward accomplishing set tasks Target date: 09/08/2025 Progress: 30%  3. Jourden will process traumatic experiences from her past and consider how these experiences may continue to impact her currently.  Diagnosis Axis none 309.28 (Adjustment disorder with mixed anxiety and depressed mood) - Open - [Signifier: n/a]    Conditions For Discharge Achievement of treatment goals and objectives  ntake Presenting Problem Rozlynn presented seeking CBT to cope with her daughter's ASD diagnosis and navigate next steps in her care.  Symptoms excessive worry, weight gain, sleep irregularities, fatigue History of Problem  Jaelie's 55-year-old daughter [name] was diagnosed with ASD in spring of 2022, and her twin daughter [name] was diagnosed the following year. She shared that it has been challenging to coordinate appropriate care and services for them.  Recent Trigger  Ramandeep's daughter was diagnosed with ASD in spring of 2022.  Marital and Family Information  Present family concerns/problems: None reported, although Luise would like more 1:1 time with her husband  Strengths/resources in the family/friends: Jamilett described a great relationship with her husband  Marital/sexual history patterns: Jannie has been married for 10 years and has been in a relationship with her husband for 23 years  Family of Origin  Problems in family of origin: Taneka shared that she has very few memories of her child until she was in the end of elementary school. Her  family moved [place]   from [place] when she was 43 years of age. She shared that she was cared for primarily by a nanny in early childhood, as is typical of [place] culture, and her father traveled frequently for work.  Family background / ethnic factors: Leida is from [place]  No needs/concerns related to ethnicity reported when asked: No  Education/Vocation  Interpersonal concerns/problems: Alice shared that she feels isolated since having children and social restrictions during the COVID19 pandemic. She moved to [facility] for her husband's job in 2014 and she shared that she does not have a significant support system in the area outside of two close friends who live in [place].  Personal strengths: Mari does not hold on to grudges and described herself as easy going.  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  Leisure Activities/Daily Functioning  gave up  Legal Status  No Legal Problems  Substance use/abuse/dependence  unspecified  Comments: Weight gain-Darlean has participated the [facility] weight management program  Religion/Spirituality  Not Reported  Other  General Behavior: cooperative  Attire: appropriate  Gait: Not observed-telehealth  Motor Activity: normal  Stream of Thought - Productivity: spontaneous  Stream of thought - Progression: normal  Stream of thought - Language: normal  Emotional tone and reactions - Mood: normal  Emotional tone and reactions - Affect: appropriate  Mental trend/Content of thoughts - Perception: normal  Mental trend/Content of thoughts - Orientation: normal  Mental trend/Content of thoughts - Memory: normal  Mental trend/Content of thoughts - General knowledge: consistent with education  Insight: good  Judgment: good  Intelligence: high      [name],  PhD      [name], PhD               [name], PhDDiagnosis: Q56.76 Time: 1:00 pm-2:00 pm CPT: 09162E-04  Tahni was seen remotely using secure video conferencing. She was in her home and the therapist was in her office at the time of the appointment. Client is aware of risks of telehealth and consented to a virtual visit. Session focused on recent stressors, which included caring for her mother and mother in law, and a challenging conversation with her husband. Therapist offered validation and support, and suggested communication strategies. Zannah is scheduled to be seen again in one month, and therapist will reach out as cancellations happen.  Treatment Plan Client Abilities/Strengths  Zuleima is seeking CBT to help her cope with her daughter's ASD diagnosis and pursue appropriate treatments. She shared that she is also interested in regaining motivation to achieve previous levels of discipline in her lifestyle.  Client Treatment Preferences  Jaslynn prefers remote appointments that occur while her children are in school, between 9:30am-12:00am  Client Statement of Needs  Chantalle is seeking CBT to help her cope with and navigate her daughter's diagnosis, as well as establishing and maintaining a self-care routine  Treatment Level  Monthly  Symptoms  Anxiety: sleep irregularities, excessive worry (Status: maintained). Depression: frequent headaches, weight gain, fatigue, sleep difficulties (Status: maintained).  Problems Addressed  New Description, New Description  Goals 1. Tangelia has struggled to maintain self care in recent years Objective Mabell would like to increase her understanding of her daughters in order to better address their needs Target Date: 2025-09-08 Frequency: Monthly  Progress: 70% one daughter, 25% other daughter Modality: individual  Related  Interventions Therapist will incorporate manualized treatments including Parent Training for  Disruptive Behaviors in ASD  Therapist will provide referrals for additional resources as appropriate  Objective Jiah would like to establish a consistent self care routine  Target Date: 2025-09-08 Frequency: Monthly  Progress: 30 Modality: individual  Related Interventions Therapist will incorporate CBT based strategies to help Gianne identify and disengage from maladaptive thoughts and behaviors Shanta will be provided opportunities to process experiences in session 2. Khadejah would like to increase self-accountability toward accomplishing set tasks Target date: 09/08/2025 Progress: 30%  3. Mashawn will process traumatic experiences from her past and consider how these experiences may continue to impact her currently.  Diagnosis Axis none 309.28 (Adjustment disorder with mixed anxiety and depressed mood) - Open - [Signifier: n/a]    Conditions For Discharge Achievement of treatment goals and objectives  ntake Presenting Problem Trishelle presented seeking CBT to cope with her daughter's ASD diagnosis and navigate next steps in her care.  Symptoms excessive worry, weight gain, sleep irregularities, fatigue History of Problem  Pailyn's 53-year-old daughter [name] was diagnosed with ASD in spring of 2022, and her twin daughter [name] was diagnosed the following year. She shared that it has been challenging to coordinate appropriate care and services for them.  Recent Trigger  Shondrea's daughter was diagnosed with ASD in spring of 2022.  Marital and Family Information  Present family concerns/problems: None reported, although Bibi would like more 1:1 time with her husband  Strengths/resources in the family/friends: Nyanna described a great relationship with her husband  Marital/sexual history patterns: Lonia has been married for 10 years and has been in a relationship with her husband for 23 years  Family of Origin  Problems in family of origin: Iyah shared that she has very few memories  of her child until she was in the end of elementary school. Her family [place]  from [place] when she was 43 years of age. She shared that she was cared for primarily by a nanny in early childhood, as is typical of [place] culture, and her father traveled frequently for work.  Family background / ethnic factors: Lachrisha is from [place]  No needs/concerns related to ethnicity reported when asked: No  Education/Vocation  Interpersonal concerns/problems: Caya shared that she feels isolated since having children and social restrictions during the COVID19 pandemic. She moved to [place] [facility] for her husband's job in 2014 and she shared that she does not have a significant support system in the area outside of two close friends who live in [place].  Personal strengths: Ahri does not hold on to grudges and described herself as easy going.  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  Leisure Activities/Daily Functioning  gave up  Legal Status  No Legal Problems  Substance use/abuse/dependence  unspecified  Comments: Weight gain-Abrina has participated the [facility] weight management program  Religion/Spirituality  Not Reported  Other  General Behavior: cooperative  Attire: appropriate  Gait: Not observed-telehealth  Motor Activity: normal  Stream of Thought - Productivity: spontaneous  Stream of thought - Progression: normal  Stream of thought - Language: normal  Emotional tone and reactions - Mood: normal  Emotional tone and reactions - Affect: appropriate  Mental trend/Content of thoughts - Perception: normal  Mental trend/Content of thoughts - Orientation: normal  Mental trend/Content of thoughts - Memory:  normal  Mental trend/Content of thoughts - General knowledge: consistent with education  Insight: good  Judgment: good   Intelligence: high     [name], PhD               [name], PhD

## 2024-11-10 ENCOUNTER — Ambulatory Visit: Admitting: Family Medicine

## 2024-11-10 VITALS — BP 124/82 | HR 91 | Ht 62.0 in | Wt 170.0 lb

## 2024-11-10 DIAGNOSIS — Z6830 Body mass index (BMI) 30.0-30.9, adult: Secondary | ICD-10-CM

## 2024-11-10 DIAGNOSIS — F900 Attention-deficit hyperactivity disorder, predominantly inattentive type: Secondary | ICD-10-CM

## 2024-11-10 DIAGNOSIS — G43709 Chronic migraine without aura, not intractable, without status migrainosus: Secondary | ICD-10-CM

## 2024-11-10 DIAGNOSIS — F4323 Adjustment disorder with mixed anxiety and depressed mood: Secondary | ICD-10-CM | POA: Diagnosis not present

## 2024-11-10 DIAGNOSIS — F50811 Binge eating disorder, moderate: Secondary | ICD-10-CM | POA: Diagnosis not present

## 2024-11-10 DIAGNOSIS — Z23 Encounter for immunization: Secondary | ICD-10-CM

## 2024-11-10 DIAGNOSIS — E559 Vitamin D deficiency, unspecified: Secondary | ICD-10-CM | POA: Diagnosis not present

## 2024-11-10 DIAGNOSIS — E66811 Obesity, class 1: Secondary | ICD-10-CM | POA: Diagnosis not present

## 2024-11-10 DIAGNOSIS — I10 Essential (primary) hypertension: Secondary | ICD-10-CM

## 2024-11-11 ENCOUNTER — Encounter: Payer: Self-pay | Admitting: Family Medicine

## 2024-11-11 ENCOUNTER — Other Ambulatory Visit (HOSPITAL_COMMUNITY): Payer: Self-pay

## 2024-11-11 MED ORDER — LISDEXAMFETAMINE DIMESYLATE 20 MG PO CAPS
20.0000 mg | ORAL_CAPSULE | Freq: Every day | ORAL | 0 refills | Status: AC
  Filled 2024-11-11: qty 30, 30d supply, fill #0

## 2024-11-11 MED ORDER — VITAMIN D (ERGOCALCIFEROL) 1.25 MG (50000 UNIT) PO CAPS
50000.0000 [IU] | ORAL_CAPSULE | ORAL | 3 refills | Status: AC
  Filled 2024-11-11 – 2024-11-27 (×2): qty 4, 28d supply, fill #0
  Filled 2025-01-04: qty 12, 84d supply, fill #1

## 2024-11-11 NOTE — Progress Notes (Signed)
 Assessment and Plan 1. Vitamin D  deficiency (Primary) Stable but not optimized. The current medical regimen is effective;  continue present plan and medications. - Vitamin D , Ergocalciferol , (DRISDOL ) 1.25 MG (50000 UNIT) CAPS capsule; Take 1 capsule (50,000 Units total) by mouth every 7 (seven) days.  Dispense: 4 capsule; Refill: 3  2. Primary hypertension The current medical regimen is effective;  continue present plan and medications. - losartan  (COZAAR ) 50 MG tablet; Take 1 tablet (50 mg total) by mouth daily.  Dispense: 90 tablet; Refill: 1 - spironolactone  (ALDACTONE ) 50 MG tablet; Take 1 tablet (50 mg total) by mouth daily.  Dispense: 90 tablet; Refill: 3  3. Binge eating disorder, moderate The goals for treatment of BED are to reduce eating binges and to achieve healthy eating habits. Because binge eating can correlate with negative emotions, treatment may also address any other mental-health issues, such as depression.  People who binge eat feel as if they don't have control over how much they eat and have feelings of guilt or self-loathing after a binge eating episode.  The FDA has approved Vyvanse  as a treatment option for binge eating disorder. Vyvanse  targets the brain's reward center by increasing the levels of dopamine and norepinephrine, the chemicals of the brain responsible for feelings of pleasure.  Mindful eating is the recommended nutritional approach to treating BED.   - lisdexamfetamine (VYVANSE ) 20 MG capsule; Take 1 capsule (20 mg total) by mouth daily in the afternoon. Take in addition to 40 mg in the morning.  Dispense: 30 capsule; Refill: 0 - lisdexamfetamine (VYVANSE ) 40 MG capsule; Take 1 capsule (40 mg total) by mouth every morning.  Dispense: 30 capsule; Refill: 0  4. Adjustment disorder with mixed anxiety and depressed mood Stable. [name] has ongoing external stressors. She finds therapy helpful. - escitalopram  (LEXAPRO ) 20 MG tablet; Take 1 tablet (20  mg total) by mouth daily.  Dispense: 90 tablet; Refill: 1  5. Attention deficit hyperactivity disorder (ADHD), predominantly inattentive type Improving, but not optimized. Medication:Vyvanse .  Counseling ADHD is the most missed diagnosis in relation to food and appetite problems. Often the strong urge to binge or to self-medicate with food subsides once the impulsivity and inattention of ADHD are treated. A person can experience a new ability to tune in to the body's signals, control cravings and improve impulse control.  A deficiency in norepinephrine and dopamine can lead to the following behaviors related to eating:  Poor awareness of internal cues of hunger and satiety, or fullness. Inability to follow a meal plan. Inability to judge portion size accurately. Inability to stop bingeing or purging. Distraction by continual thoughts of food, weight and body shape. Increased desire to overeat, especially high calorie, "reward" type foods. Poor self-esteem due to repeated failures of self-control.  Plan: The current medical regimen is effective;  continue present plan and medications.  6. Chronic migraine without aura without status migrainosus, not intractable The current medical regimen is effective;  continue present plan and medications. - topiramate  ER (QUDEXY  XR) 100 MG CS24 sprinkle capsule; Take 1 capsule (100 mg total) by mouth daily.  Dispense: 90 capsule; Refill: 3  7. Immunization due - Flu vaccine trivalent PF, 6mos and older(Flulaval,Afluria,Fluarix,Fluzone)  8. Class 1 obesity with serious comorbidity and body mass index (BMI) of 30.0 to 30.9 in adult, unspecified obesity type Improving. New goals reviewed.  - tirzepatide  (ZEPBOUND ) 15 MG/0.5ML Pen; Inject 15 mg into the skin once a week.  Dispense: 2 mL; Refill: 1  [name], DO, MS, FAAFP, Dipl. [facility] [address] [place], [address] Dept:  [phone] Dept Fax: [phone]  Subjective:   Patient is well-known to me from previous care setting and is establishing care in this system with me as PCP. Prior records reviewed when available. Chart updated today with reconciliation of problem list, medications, allergies, and relevant history. Preventive care and chronic disease status reviewed. Portions of historical chart may remain incomplete; will update on an ongoing basis as clinically indicated.  Discussed the use of AI scribe software for clinical note transcription with the patient, who gave verbal consent to proceed. History of Present Illness Cynthia [name] is a 43 year old female who presents for medication management and follow-up.  Weight loss - Lost approximately 30 pounds since last visit  Anxiety and medication management - Currently taking Vyvanse  40 mg, Lexapro , and Xanax  (out of Xanax  for a couple of months and but does not require refill) - Attends weekly therapy sessions with Dr. Tenny - Finds therapy sessions helpful  Chronic medication use - Current medications include vitamin D , topiramate  15 mg, spironolactone , Loestrin , losartan , and baclofen   Preventive care - Recently received flu vaccination  Review of Systems: Negative, with the exception of above mentioned in HPI.  Current Outpatient Medications:    ALPRAZolam  (XANAX ) 0.5 MG tablet, Take 1 tablet (0.5 mg total) by mouth at bedtime as needed for anxiety., Disp: 30 tablet, Rfl: 0   baclofen  (LIORESAL ) 10 MG tablet, Take 1 tablet (10 mg total) by mouth at bedtime as needed for muscle spasms., Disp: 90 tablet, Rfl: 0   cetirizine (ZYRTEC) 10 MG tablet, Take 10 mg by mouth daily., Disp: , Rfl:    diphenhydrAMINE  (BENADRYL ) 50 MG capsule, Take 50 mg by mouth at bedtime as needed., Disp: , Rfl:    escitalopram  (LEXAPRO ) 20 MG tablet, Take 1 tablet (20 mg total) by mouth daily., Disp: 90 tablet, Rfl: 1   fluticasone  (FLONASE ) 50 MCG/ACT nasal  spray, PLACE 2 SPRAYS INTO BOTH NOSTRILS DAILY., Disp: 16 g, Rfl: 6   lisdexamfetamine (VYVANSE ) 40 MG capsule, Take 1 capsule (40 mg total) by mouth every morning., Disp: 30 capsule, Rfl: 0   losartan  (COZAAR ) 50 MG tablet, Take 1 tablet (50 mg total) by mouth daily., Disp: 90 tablet, Rfl: 1   norethindrone -ethinyl estradiol  (LOESTRIN ) 1-20 MG-MCG tablet, Take 1 tablet by mouth daily., Disp: 63 tablet, Rfl: 1   spironolactone  (ALDACTONE ) 50 MG tablet, Take 1 tablet (50 mg total) by mouth daily., Disp: 90 tablet, Rfl: 3   topiramate  ER (QUDEXY  XR) 100 MG CS24 sprinkle capsule, Take 1 capsule (100 mg total) by mouth daily., Disp: 90 capsule, Rfl: 3   lisdexamfetamine (VYVANSE ) 20 MG capsule, Take 1 capsule (20 mg total) by mouth daily in the afternoon. Take in addition to 40 mg in the morning., Disp: 30 capsule, Rfl: 0   tirzepatide  (ZEPBOUND ) 15 MG/0.5ML Pen, Inject 15 mg into the skin once a week., Disp: 2 mL, Rfl: 1   Vitamin D , Ergocalciferol , (DRISDOL ) 1.25 MG (50000 UNIT) CAPS capsule, Take 1 capsule (50,000 Units total) by mouth every 7 (seven) days., Disp: 4 capsule, Rfl: 2   Vitamin D , Ergocalciferol , (DRISDOL ) 1.25 MG (50000 UNIT) CAPS capsule, Take 1 capsule (50,000 Units total) by mouth every 7 (seven) days., Disp: 4 capsule, Rfl: 3   Objective:   BP 124/82 (BP Location: Right Arm, Cuff Size: Normal)   Pulse 91  Ht 5' 2 (1.575 m)   Wt 170 lb (77.1 kg)   SpO2 100%   BMI 31.09 kg/m   Wt Readings from Last 3 Encounters:  11/10/24 170 lb (77.1 kg)  12/16/23 186 lb 8 oz (84.6 kg)  02/11/23 187 lb 4.8 oz (85 kg)   Physical Exam Constitutional:      General: She is not in acute distress.    Appearance: She is well-developed.  HENT:     Head: Normocephalic and atraumatic.  Eyes:     Conjunctiva/sclera: Conjunctivae normal.  Cardiovascular:     Rate and Rhythm: Normal rate and regular rhythm.     Heart sounds: Normal heart sounds.  Pulmonary:     Effort: Pulmonary effort is  normal.     Breath sounds: Normal breath sounds.  Musculoskeletal:     Cervical back: Normal range of motion and neck supple.  Neurological:     General: No focal deficit present.     Mental Status: She is alert and oriented to person, place, and time.  Psychiatric:        Mood and Affect: Mood normal.        Behavior: Behavior normal.   Note: Other outside labs reviewed and will be abstracted. Below labs are the most recent in local EPIC.  Lab Results  Component Value Date   CREATININE 0.70 12/16/2023   BUN 14 12/16/2023   NA 138 12/16/2023   K 4.4 12/16/2023   CL 108 12/16/2023   CO2 24 12/16/2023   Lab Results  Component Value Date   ALT 8 12/16/2023   AST 12 12/16/2023   ALKPHOS 56 12/16/2023   BILITOT 0.2 12/16/2023   Lab Results  Component Value Date   HGBA1C 5.5 04/17/2023   HGBA1C 5.8 11/06/2022   HGBA1C 5.7 (H) 10/25/2021   HGBA1C 6.1 (H) 07/20/2020   HGBA1C 4.5 (L) 03/18/2018   Lab Results  Component Value Date   INSULIN  13.8 10/25/2021   INSULIN  25.6 (H) 07/20/2020   Lab Results  Component Value Date   TSH 1.38 12/16/2023   Lab Results  Component Value Date   CHOL 189 12/16/2023   HDL 39.80 12/16/2023   LDLCALC 114 (H) 12/16/2023   LDLDIRECT 110.0 12/28/2015   TRIG 174.0 (H) 12/16/2023   CHOLHDL 5 12/16/2023   Lab Results  Component Value Date   VD25OH 34.73 12/16/2023   VD25OH 25.92 (L) 04/17/2023   VD25OH 25.55 (L) 11/06/2022   Lab Results  Component Value Date   WBC 8.5 12/16/2023   HGB 11.9 (L) 12/16/2023   HCT 37.2 12/16/2023   MCV 85.2 12/16/2023   PLT 442.0 (H) 12/16/2023   Lab Results  Component Value Date   IRON 76 10/25/2021   TIBC 475 (H) 10/25/2021   FERRITIN 13 (L) 10/25/2021

## 2024-11-15 DIAGNOSIS — F900 Attention-deficit hyperactivity disorder, predominantly inattentive type: Secondary | ICD-10-CM | POA: Insufficient documentation

## 2024-11-15 DIAGNOSIS — G43709 Chronic migraine without aura, not intractable, without status migrainosus: Secondary | ICD-10-CM | POA: Insufficient documentation

## 2024-11-15 DIAGNOSIS — Z6830 Body mass index (BMI) 30.0-30.9, adult: Secondary | ICD-10-CM | POA: Insufficient documentation

## 2024-11-15 DIAGNOSIS — F4323 Adjustment disorder with mixed anxiety and depressed mood: Secondary | ICD-10-CM | POA: Insufficient documentation

## 2024-11-15 DIAGNOSIS — F50811 Binge eating disorder, moderate: Secondary | ICD-10-CM | POA: Insufficient documentation

## 2024-11-15 MED ORDER — ESCITALOPRAM OXALATE 20 MG PO TABS
20.0000 mg | ORAL_TABLET | Freq: Every day | ORAL | 1 refills | Status: AC
  Filled 2024-11-15: qty 90, 90d supply, fill #0

## 2024-11-15 MED ORDER — LOSARTAN POTASSIUM 50 MG PO TABS
50.0000 mg | ORAL_TABLET | Freq: Every day | ORAL | 1 refills | Status: AC
  Filled 2024-11-15: qty 90, 90d supply, fill #0

## 2024-11-15 MED ORDER — SPIRONOLACTONE 50 MG PO TABS
50.0000 mg | ORAL_TABLET | Freq: Every day | ORAL | 3 refills | Status: AC
  Filled 2024-11-15: qty 90, 90d supply, fill #0

## 2024-11-15 MED ORDER — TOPIRAMATE ER 100 MG PO SPRINKLE CAP24
100.0000 mg | EXTENDED_RELEASE_CAPSULE | Freq: Every day | ORAL | 3 refills | Status: AC
  Filled 2024-11-15: qty 90, 90d supply, fill #0

## 2024-11-15 MED ORDER — LISDEXAMFETAMINE DIMESYLATE 40 MG PO CAPS
40.0000 mg | ORAL_CAPSULE | ORAL | 0 refills | Status: AC
  Filled 2024-11-15 – 2025-01-04 (×2): qty 30, 30d supply, fill #0

## 2024-11-15 MED ORDER — ZEPBOUND 15 MG/0.5ML ~~LOC~~ SOAJ
15.0000 mg | SUBCUTANEOUS | 1 refills | Status: AC
  Filled 2024-11-15 – 2025-01-04 (×3): qty 2, 28d supply, fill #0

## 2024-11-16 ENCOUNTER — Other Ambulatory Visit (HOSPITAL_COMMUNITY): Payer: Self-pay

## 2024-11-23 ENCOUNTER — Other Ambulatory Visit (HOSPITAL_COMMUNITY): Payer: Self-pay

## 2024-11-26 ENCOUNTER — Other Ambulatory Visit (HOSPITAL_COMMUNITY): Payer: Self-pay

## 2024-11-27 ENCOUNTER — Other Ambulatory Visit (HOSPITAL_COMMUNITY): Payer: Self-pay

## 2024-12-01 ENCOUNTER — Other Ambulatory Visit (HOSPITAL_COMMUNITY): Payer: Self-pay

## 2024-12-02 ENCOUNTER — Other Ambulatory Visit (HOSPITAL_COMMUNITY): Payer: Self-pay

## 2024-12-04 ENCOUNTER — Other Ambulatory Visit (HOSPITAL_COMMUNITY): Payer: Self-pay

## 2024-12-07 ENCOUNTER — Ambulatory Visit: Admitting: Clinical

## 2024-12-07 DIAGNOSIS — F4323 Adjustment disorder with mixed anxiety and depressed mood: Secondary | ICD-10-CM

## 2024-12-07 NOTE — Progress Notes (Signed)
 Diagnosis: F43.23 Time: 1:00 pm-1:56 pm CPT: 09162E-04  Hildy was seen remotely using secure video conferencing. She was in her home and the therapist was in her office at the time of the appointment. Client is aware of risks of telehealth and consented to a virtual visit. Session focused on Whittley's continued concern for her daughters, including plans for their education and future. Therapist offered an opportunity to process, providing validation and support and suggesting resources. She is scheduled to be seen again in two weeks.  Treatment Plan Client Abilities/Strengths  Gustie is seeking CBT to help her cope with her daughter's ASD diagnosis and pursue appropriate treatments. She shared that she is also interested in regaining motivation to achieve previous levels of discipline in her lifestyle.  Client Treatment Preferences  Ercia prefers remote appointments that occur while her children are in school, between 9:30am-12:00am  Client Statement of Needs  Arrow is seeking CBT to help her cope with and navigate her daughter's diagnosis, as well as establishing and maintaining a self-care routine  Treatment Level  Monthly  Symptoms  Anxiety: sleep irregularities, excessive worry (Status: maintained). Depression: frequent headaches, weight gain, fatigue, sleep difficulties (Status: maintained).  Problems Addressed  New Description, New Description  Goals 1. Kammi has struggled to maintain self care in recent years Objective Raelynne would like to increase her understanding of her daughters in order to better address their needs Target Date: 2025-09-08 Frequency: Monthly  Progress: 70% one daughter, 25% other daughter Modality: individual  Related Interventions Therapist will incorporate manualized treatments including Parent Training for Disruptive Behaviors in ASD  Therapist will provide referrals for additional resources as appropriate  Objective Vivia would like to establish a  consistent self care routine  Target Date: 2025-09-08 Frequency: Monthly  Progress: 30 Modality: individual  Related Interventions Therapist will incorporate CBT based strategies to help Azzie identify and disengage from maladaptive thoughts and behaviors Caroleen will be provided opportunities to process experiences in session 2. Madelin would like to increase self-accountability toward accomplishing set tasks Target date: 09/08/2025 Progress: 30%  3. Tzirel will process traumatic experiences from her past and consider how these experiences may continue to impact her currently.  Diagnosis Axis none 309.28 (Adjustment disorder with mixed anxiety and depressed mood) - Open - [Signifier: n/a]    Conditions For Discharge Achievement of treatment goals and objectives  ntake Presenting Problem Ethelda presented seeking CBT to cope with her daughter's ASD diagnosis and navigate next steps in her care.  Symptoms excessive worry, weight gain, sleep irregularities, fatigue History of Problem  Kathlene's 64-year-old daughter [name] was diagnosed with ASD in spring of 2022, and her twin daughter [name] was diagnosed the following year. She shared that it has been challenging to coordinate appropriate care and services for them.  Recent Trigger  Shaneisha's daughter was diagnosed with ASD in spring of 2022.  Marital and Family Information  Present family concerns/problems: None reported, although Tobey would like more 1:1 time with her husband  Strengths/resources in the family/friends: Lilana described a great relationship with her husband  Marital/sexual history patterns: Laritza has been married for 10 years and has been in a relationship with her husband for 23 years  Family of Origin  Problems in family of origin: Carolyn shared that she has very few memories of her child until she was in the end of elementary school. Her family moved to [place]  from [place] when she was 43 years of age. She shared that  she was cared for primarily  by a nanny in early childhood, as is typical of [place] culture, and her father traveled frequently for work.  Family background / ethnic factors: Robi is from [place]  No needs/concerns related to ethnicity reported when asked: No  Education/Vocation  Interpersonal concerns/problems: Venesa shared that she feels isolated since having children and social restrictions during the COVID19 pandemic. She moved to [facility] for her husband's job in 2014 and she shared that she does not have a significant support system in the area outside of two close friends who live in [place].  Personal strengths: Sister does not hold on to grudges and described herself as easy going.  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  Leisure Activities/Daily Functioning  gave up  Legal Status  No Legal Problems  Substance use/abuse/dependence  unspecified  Comments: Weight gain-Cloteal has participated the [facility] weight management program  Religion/Spirituality  Not Reported  Other  General Behavior: cooperative  Attire: appropriate  Gait: Not observed-telehealth  Motor Activity: normal  Stream of Thought - Productivity: spontaneous  Stream of thought - Progression: normal  Stream of thought - Language: normal  Emotional tone and reactions - Mood: normal  Emotional tone and reactions - Affect: appropriate  Mental trend/Content of thoughts - Perception: normal  Mental trend/Content of thoughts - Orientation: normal  Mental trend/Content of thoughts - Memory: normal  Mental trend/Content of thoughts - General knowledge: consistent with education  Insight: good  Judgment: good  Intelligence: high      [name], PhD      [name], PhD               [name],  PhDDiagnosis: Q56.76 Time: 1:00 pm-2:00 pm CPT: 09162E-04  Chanley was seen remotely using secure video conferencing. She was in her home and the therapist was in her office at the time of the appointment. Client is aware of risks of telehealth and consented to a virtual visit. Session focused on recent stressors, which included caring for her mother and mother in law, and a challenging conversation with her husband. Therapist offered validation and support, and suggested communication strategies. Bryann is scheduled to be seen again in one month, and therapist will reach out as cancellations happen.  Treatment Plan Client Abilities/Strengths  Maree is seeking CBT to help her cope with her daughter's ASD diagnosis and pursue appropriate treatments. She shared that she is also interested in regaining motivation to achieve previous levels of discipline in her lifestyle.  Client Treatment Preferences  Niajah prefers remote appointments that occur while her children are in school, between 9:30am-12:00am  Client Statement of Needs  Charise is seeking CBT to help her cope with and navigate her daughter's diagnosis, as well as establishing and maintaining a self-care routine  Treatment Level  Monthly  Symptoms  Anxiety: sleep irregularities, excessive worry (Status: maintained). Depression: frequent headaches, weight gain, fatigue, sleep difficulties (Status: maintained).  Problems Addressed  New Description, New Description  Goals 1. Scout has struggled to maintain self care in recent years Objective Jaiya would like to increase her understanding of her daughters in order to better address their needs Target Date: 2025-09-08 Frequency: Monthly  Progress: 70% one daughter, 25% other daughter Modality: individual  Related Interventions Therapist will incorporate manualized treatments including Parent Training for Disruptive Behaviors in ASD  Therapist will  provide referrals for additional  resources as appropriate  Objective Myli would like to establish a consistent self care routine  Target Date: 2025-09-08 Frequency: Monthly  Progress: 30 Modality: individual  Related Interventions Therapist will incorporate CBT based strategies to help Jilliann identify and disengage from maladaptive thoughts and behaviors Desira will be provided opportunities to process experiences in session 2. Marenda would like to increase self-accountability toward accomplishing set tasks Target date: 09/08/2025 Progress: 30%  3. Alsha will process traumatic experiences from her past and consider how these experiences may continue to impact her currently.  Diagnosis Axis none 309.28 (Adjustment disorder with mixed anxiety and depressed mood) - Open - [Signifier: n/a]    Conditions For Discharge Achievement of treatment goals and objectives  ntake Presenting Problem Kaydynce presented seeking CBT to cope with her daughter's ASD diagnosis and navigate next steps in her care.  Symptoms excessive worry, weight gain, sleep irregularities, fatigue History of Problem  Rikayla's 17-year-old daughter [name] was diagnosed with ASD in spring of 2022, and her twin daughter [name] was diagnosed the following year. She shared that it has been challenging to coordinate appropriate care and services for them.  Recent Trigger  Florina's daughter was diagnosed with ASD in spring of 2022.  Marital and Family Information  Present family concerns/problems: None reported, although Elasha would like more 1:1 time with her husband  Strengths/resources in the family/friends: Evona described a great relationship with her husband  Marital/sexual history patterns: Lakeisa has been married for 10 years and has been in a relationship with her husband for 23 years  Family of Origin  Problems in family of origin: Ashelyn shared that she has very few memories of her child until she was in the end of elementary school. Her family [place]  from [place] when she was 44 years of age. She shared that she was cared for primarily by a nanny in early childhood, as is typical of [place] culture, and her father traveled frequently for work.  Family background / ethnic factors: Tavionna is from [place]  No needs/concerns related to ethnicity reported when asked: No  Education/Vocation  Interpersonal concerns/problems: Monae shared that she feels isolated since having children and social restrictions during the COVID19 pandemic. She moved to [facility] for her husband's job in 2014 and she shared that she does not have a significant support system in the area outside of two close friends who live in [place].  Personal strengths: Sareen does not hold on to grudges and described herself as easy going.  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  Leisure Activities/Daily Functioning  gave up  Legal Status  No Legal Problems  Substance use/abuse/dependence  unspecified  Comments: Weight gain-Khady has participated the [facility] weight management program  Religion/Spirituality  Not Reported  Other  General Behavior: cooperative  Attire: appropriate  Gait: Not observed-telehealth  Motor Activity: normal  Stream of Thought - Productivity: spontaneous  Stream of thought - Progression: normal  Stream of thought - Language: normal  Emotional tone and reactions - Mood: normal  Emotional tone and reactions - Affect: appropriate  Mental trend/Content of thoughts - Perception: normal  Mental trend/Content of thoughts - Orientation: normal  Mental trend/Content of thoughts - Memory: normal  Mental trend/Content of thoughts - General knowledge: consistent with education  Insight: good  Judgment:  good  Intelligence: high    [name], PhD

## 2024-12-08 ENCOUNTER — Other Ambulatory Visit (HOSPITAL_COMMUNITY): Payer: Self-pay

## 2024-12-08 MED ORDER — MOXIFLOXACIN HCL 0.5 % OP SOLN
1.0000 [drp] | Freq: Four times a day (QID) | OPHTHALMIC | 0 refills | Status: DC
  Filled 2024-12-08: qty 3, 15d supply, fill #0

## 2024-12-08 MED ORDER — FLUOROMETHOLONE 0.1 % OP SUSP
1.0000 [drp] | Freq: Four times a day (QID) | OPHTHALMIC | 0 refills | Status: DC
  Filled 2024-12-08: qty 5, 25d supply, fill #0

## 2024-12-15 ENCOUNTER — Ambulatory Visit: Payer: Self-pay

## 2024-12-15 NOTE — Telephone Encounter (Signed)
 FYI Only or Action Required?: FYI only for provider: appointment scheduled on 12/16/24 .  Patient was last seen in primary care on 11/10/2024 by Prentiss Frieze, DO.  Called Nurse Triage reporting Cough and Sore Throat.  Symptoms began 10-11 days .  Interventions attempted: OTC medications: generic sudafed [facility] brand and nyquil .  Symptoms are: stable.  Triage Disposition: See Physician Within 24 Hours  Patient/caregiver understands and will follow disposition?: Yes             Reason for Disposition  SEVERE coughing spells (e.g., whooping sound after coughing, vomiting after coughing)  Answer Assessment - Initial Assessment Questions Patient reports over 1 week , not feeling well, 10-11 days. Cough , congestion  sore throat and fever. Colored phlegm yellow with cough had a little blood tinge earlier on. Severe  coughing fits. Hasn't had fever in a few days, felt feverish at night didn't check temperature. Was having night sweats. Sore throat more problematic right now. Pain 6/10 able to swallow. Sore throat getting. Was taking sudafed , now taking generic [facility] sudafed and couple nights ago nyquil which helped. Patient requesting appointment after 1 PM any office , not able to find PCP office appointment tomorrow after or today, booked at [facility] [place] per patient request and confirmed address of office and time of visit      1. ONSET: When did the cough begin?      Cough started 10-11 days ago  2. SEVERITY: How bad is the cough today?      Severe coughing fits  3. SPUTUM: Describe the color of your sputum (e.g., none, dry cough; clear, white, yellow, green)     Yellow color 4. HEMOPTYSIS: Are you coughing up any blood? If Yes, ask: How much? (e.g., flecks, streaks, tablespoons, etc.)     Denies currently but earlier on had small tinge in the mucus  5. DIFFICULTY BREATHING: Are you having difficulty breathing? If Yes, ask: How bad is it? (e.g., mild,  moderate, severe)      Denies  6. FEVER: Do you have a fever? If Yes, ask: What is your temperature, how was it measured, and when did it start?     Possible overnight a few nights ago didn't check had night sweats 7. CARDIAC HISTORY: Do you have any history of heart disease? (e.g., heart attack, congestive heart failure)      Hypertension per chart  8. LUNG HISTORY: Do you have any history of lung disease?  (e.g., pulmonary embolus, asthma, emphysema)     None per chart   10. OTHER SYMPTOMS: Do you have any other symptoms? (e.g., runny nose, wheezing, chest pain)       Congestion and sore throat   Patient denies the following  chest pain, shortness of breath, wheezing, inability to swallow  Protocols used: Cough - Acute Productive-A-AH   Copied from CRM #[id number]. Topic: Clinical - Red Word Triage >> Dec 15, 2024  3:02 PM Roselie BROCKS wrote: Red Word that prompted transfer to Nurse Triage: Patient states she has been sick over a week, deep productive cough with mucus, congestion, sore throat, fever

## 2024-12-16 ENCOUNTER — Encounter: Payer: Self-pay | Admitting: Family Medicine

## 2024-12-16 ENCOUNTER — Ambulatory Visit: Admitting: Family Medicine

## 2024-12-16 VITALS — BP 159/93 | HR 84 | Temp 98.9°F | Ht 62.0 in | Wt 172.0 lb

## 2024-12-16 DIAGNOSIS — J329 Chronic sinusitis, unspecified: Secondary | ICD-10-CM

## 2024-12-16 DIAGNOSIS — J40 Bronchitis, not specified as acute or chronic: Secondary | ICD-10-CM | POA: Diagnosis not present

## 2024-12-16 NOTE — Progress Notes (Signed)
 "  Acute Office Visit  Subjective:     Patient ID: Cynthia [name], female    DOB: 07-Feb-1981, 44 y.o.   MRN: [id number]  Chief Complaint  Patient presents with   Cough    ONSET 12/04/2024  Cough still very consistent - not as tired - some brain fog - seen ophthalmologist bc having symptoms similar to conjunctivitis - was given eyedrops Covid at home test - negetive   Nasal Congestion   Chills    FOR SEVERAL DAYS - FELT LIKE SHE HAD A FEVER BUT NEVER CHECKED No chills in the past 3 days     Cough Pertinent negatives include no ear pain, shortness of breath or wheezing.   Patient is in today for ACUTE VISIT.  She reports symptoms started with cough, congestion mainly nasal congestion, fatigue, sore throat, and intermittent fever. She says she never checked her temp. She would have chills and wake up with sweats that lasted 3 days but hasn't felt this way for the last 5 days. Took covid test and was negative. She says the sore throat is getting worse. She has been taking Sudafed, sinus medicine, Nyquil, Tylenol  which helps with symptoms. She hasn't taken anything during the day.   Review of Systems  HENT:  Negative for ear pain.   Respiratory:  Positive for cough. Negative for shortness of breath and wheezing.   All other systems reviewed and are negative.       Objective:    BP (!) 159/93 (BP Location: Left Arm, Patient Position: Sitting, Cuff Size: Normal)   Pulse 84   Temp 98.9 F (37.2 C) (Oral)   Ht 5' 2 (1.575 m)   Wt 172 lb (78 kg)   SpO2 99%   BMI 31.46 kg/m  BP Readings from Last 3 Encounters:  12/16/24 (!) 159/93  11/10/24 124/82  02/02/24 (!) 132/91      Physical Exam Vitals and nursing note reviewed.  Constitutional:      Appearance: Normal appearance. She is normal weight.  HENT:     Head: Normocephalic and atraumatic.     Right Ear: Tympanic membrane, ear canal and external ear normal.     Left Ear: Tympanic membrane, ear canal and external  ear normal.     Nose: Nose normal.     Mouth/Throat:     Mouth: Mucous membranes are moist.     Pharynx: Oropharynx is clear.  Eyes:     Conjunctiva/sclera: Conjunctivae normal.     Pupils: Pupils are equal, round, and reactive to light.  Cardiovascular:     Rate and Rhythm: Normal rate and regular rhythm.     Pulses: Normal pulses.     Heart sounds: Normal heart sounds.  Pulmonary:     Effort: Pulmonary effort is normal.     Breath sounds: Normal breath sounds.  Abdominal:     General: Abdomen is flat. Bowel sounds are normal.  Skin:    General: Skin is warm.     Capillary Refill: Capillary refill takes less than 2 seconds.  Neurological:     General: No focal deficit present.     Mental Status: She is alert and oriented to person, place, and time. Mental status is at baseline.  Psychiatric:        Mood and Affect: Mood normal.        Behavior: Behavior normal.        Thought Content: Thought content normal.        Judgment:  Judgment normal.    No results found for any visits on 12/16/24.      Assessment & Plan:   Problem List Items Addressed This Visit   None  Sinobronchitis   Pt with likely sinobronchitis. Treat symptomatically.  No orders of the defined types were placed in this encounter.   No follow-ups on file.  Torrence CINDERELLA Barrier, MD   "

## 2024-12-21 ENCOUNTER — Ambulatory Visit: Admitting: Clinical

## 2024-12-21 DIAGNOSIS — F4323 Adjustment disorder with mixed anxiety and depressed mood: Secondary | ICD-10-CM | POA: Diagnosis not present

## 2024-12-21 NOTE — Progress Notes (Signed)
 Diagnosis: F43.23 Time: 1:00 pm-1:56 pm CPT: 09162E-04  Cynthia [name] was seen remotely using secure video conferencing. She was in her home and the therapist was in her office at the time of the appointment. Client is aware of risks of telehealth and consented to a virtual visit. Session focused on ongoing stress related to her mother having moved in and fears for her children's future. Therapist offered an opportunity to process, as well as validation and support. She is scheduled to be seen again in two weeks.  Treatment Plan Client Abilities/Strengths  Cynthia [name] is seeking CBT to help her cope with her daughter's ASD diagnosis and pursue appropriate treatments. She shared that she is also interested in regaining motivation to achieve previous levels of discipline in her lifestyle.  Client Treatment Preferences  Cynthia [name] prefers remote appointments that occur while her children are in school, between 9:30am-12:00am  Client Statement of Needs  Cynthia [name] is seeking CBT to help her cope with and navigate her daughter's diagnosis, as well as establishing and maintaining a self-care routine  Treatment Level  Monthly  Symptoms  Anxiety: sleep irregularities, excessive worry (Status: maintained). Depression: frequent headaches, weight gain, fatigue, sleep difficulties (Status: maintained).  Problems Addressed  New Description, New Description  Goals 1. [name] has struggled to maintain self care in recent years Objective Cynthia [name] would like to increase her understanding of her daughters in order to better address their needs Target Date: 2025-09-08 Frequency: Monthly  Progress: 70% one daughter, 25% other daughter Modality: individual  Related Interventions Therapist will incorporate manualized treatments including Parent Training for Disruptive Behaviors in ASD  Therapist will provide referrals for additional resources as appropriate  Objective Cynthia [name] would like to establish a consistent self care routine   Target Date: 2025-09-08 Frequency: Monthly  Progress: 30 Modality: individual  Related Interventions Therapist will incorporate CBT based strategies to help Cynthia [name] identify and disengage from maladaptive thoughts and behaviors Cynthia [name] will be provided opportunities to process experiences in session 2. Cynthia [name] would like to increase self-accountability toward accomplishing set tasks Target date: 09/08/2025 Progress: 30%  3. Cynthia [name] will process traumatic experiences from her past and consider how these experiences may continue to impact her currently.  Diagnosis Axis none 309.28 (Adjustment disorder with mixed anxiety and depressed mood) - Open - [Signifier: n/a]    Conditions For Discharge Achievement of treatment goals and objectives  ntake Presenting Problem Cynthia [name] presented seeking CBT to cope with her daughter's ASD diagnosis and navigate next steps in her care.  Symptoms excessive worry, weight gain, sleep irregularities, fatigue History of Problem  [name]'s 4-year-old daughter Cynthia [name] was diagnosed with ASD in spring of 2022, and her twin daughter [name] was diagnosed the following year. She shared that it has been challenging to coordinate appropriate care and services for them.  Recent Trigger  Cynthia [name]'s daughter was diagnosed with ASD in spring of 2022.  Marital and Family Information  Present family concerns/problems: None reported, although Cynthia [name] would like more 1:1 time with her husband  Strengths/resources in the family/friends: Cynthia [name] described a great relationship with her husband  Marital/sexual history patterns: Cynthia [name] has been married for 10 years and has been in a relationship with her husband for 23 years  Family of Origin  Problems in family of origin: Cynthia [name] shared that she has very few memories of her child until she was in the end of elementary school. Her family moved to [place]  from [place] when she was 44 years of age. She shared that she was cared for primarily  by  a nanny in early childhood, as is typical of [place] culture, and her father traveled frequently for work.  Family background / ethnic factors: Cynthia [name] is from [place]  No needs/concerns related to ethnicity reported when asked: No  Education/Vocation  Interpersonal concerns/problems: Cynthia [name] shared that she feels isolated since having children and social restrictions during the COVID19 pandemic. She moved to [facility] for her husband's job in 2014 and she shared that she does not have a significant support system in the area outside of two close friends who live in [place].  Personal strengths: Cynthia [name] does not hold on to grudges and described herself as easy going.  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  Leisure Activities/Daily Functioning  gave up  Legal Status  No Legal Problems  Substance use/abuse/dependence  unspecified  Comments: Weight gain-[name] has participated the [facility] weight management program  Religion/Spirituality  Not Reported  Other  General Behavior: cooperative  Attire: appropriate  Gait: Not observed-telehealth  Motor Activity: normal  Stream of Thought - Productivity: spontaneous  Stream of thought - Progression: normal  Stream of thought - Language: normal  Emotional tone and reactions - Mood: normal  Emotional tone and reactions - Affect: appropriate  Mental trend/Content of thoughts - Perception: normal  Mental trend/Content of thoughts - Orientation: normal  Mental trend/Content of thoughts - Memory: normal  Mental trend/Content of thoughts - General knowledge: consistent with education  Insight: good  Judgment: good  Intelligence: high      Cynthia LITTIE Ponto, Cynthia [name]      Cynthia LITTIE Ponto, Cynthia [name]               Cynthia [name], PhDDiagnosis: Q56.76 Time: 1:00 pm-2:00  pm CPT: 09162E-04  Cynthia [name] was seen remotely using secure video conferencing. She was in her home and the therapist was in her office at the time of the appointment. Client is aware of risks of telehealth and consented to a virtual visit. Session focused on recent stressors, which included caring for her mother and mother in law, and a challenging conversation with her husband. Therapist offered validation and support, and suggested communication strategies. Marene is scheduled to be seen again in one month, and therapist will reach out as cancellations happen.  Treatment Plan Client Abilities/Strengths  Cynthia [name] is seeking CBT to help her cope with her daughter's ASD diagnosis and pursue appropriate treatments. She shared that she is also interested in regaining motivation to achieve previous levels of discipline in her lifestyle.  Client Treatment Preferences  Cynthia [name] prefers remote appointments that occur while her children are in school, between 9:30am-12:00am  Client Statement of Needs  Cynthia [name] is seeking CBT to help her cope with and navigate her daughter's diagnosis, as well as establishing and maintaining a self-care routine  Treatment Level  Monthly  Symptoms  Anxiety: sleep irregularities, excessive worry (Status: maintained). Depression: frequent headaches, weight gain, fatigue, sleep difficulties (Status: maintained).  Problems Addressed  New Description, New Description  Goals 1. Cynthia [name] has struggled to maintain self care in recent years Objective Shaquoya would like to increase her understanding of her daughters in order to better address their needs Target Date: 2025-09-08 Frequency: Monthly  Progress: 70% one daughter, 25% other daughter Modality: individual  Related Interventions Therapist will incorporate manualized treatments including Parent Training for Disruptive Behaviors in ASD  Therapist  will provide referrals for additional resources as appropriate  Objective Taviana  would like to establish a consistent self care routine  Target Date: 2025-09-08 Frequency: Monthly  Progress: 30 Modality: individual  Related Interventions Therapist will incorporate CBT based strategies to help Nomie identify and disengage from maladaptive thoughts and behaviors Kaysha will be provided opportunities to process experiences in session 2. Carmell would like to increase self-accountability toward accomplishing set tasks Target date: 09/08/2025 Progress: 30%  3. Iyanna will process traumatic experiences from her past and consider how these experiences may continue to impact her currently.  Diagnosis Axis none 309.28 (Adjustment disorder with mixed anxiety and depressed mood) - Open - [Signifier: n/a]    Conditions For Discharge Achievement of treatment goals and objectives  ntake Presenting Problem Rolene presented seeking CBT to cope with her daughter's ASD diagnosis and navigate next steps in her care.  Symptoms excessive worry, weight gain, sleep irregularities, fatigue History of Problem  Aleana's 30-year-old daughter Cynthia [name] was diagnosed with ASD in spring of 2022, and her twin daughter [name] was diagnosed the following year. She shared that it has been challenging to coordinate appropriate care and services for them.  Recent Trigger  Quetzali's daughter was diagnosed with ASD in spring of 2022.  Marital and Family Information  Present family concerns/problems: None reported, although Delailah would like more 1:1 time with her husband  Strengths/resources in the family/friends: Caoimhe described a great relationship with her husband  Marital/sexual history patterns: Abella has been married for 10 years and has been in a relationship with her husband for 23 years  Family of Origin  Problems in family of origin: Nelline shared that she has very few memories of her child until she was in the end of elementary school. Her family moved [place]  from [place] when she was 44 years  of age. She shared that she was cared for primarily by a nanny in early childhood, as is typical of [place] culture, and her father traveled frequently for work.  Family background / ethnic factors: Tiffanie is from [place]  No needs/concerns related to ethnicity reported when asked: No  Education/Vocation  Interpersonal concerns/problems: Shazia shared that she feels isolated since having children and social restrictions during the COVID19 pandemic. She moved to [place] [facility] [place] [facility] for her husband's job in 2014 and she shared that she does not have a significant support system in the area outside of two close friends who live in [place].  Personal strengths: Shannah does not hold on to grudges and described herself as easy going.  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  Leisure Activities/Daily Functioning  gave up  Legal Status  No Legal Problems  Substance use/abuse/dependence  unspecified  Comments: Weight gain-Tarae has participated the [facility] weight management program  Religion/Spirituality  Not Reported  Other  General Behavior: cooperative  Attire: appropriate  Gait: Not observed-telehealth  Motor Activity: normal  Stream of Thought - Productivity: spontaneous  Stream of thought - Progression: normal  Stream of thought - Language: normal  Emotional tone and reactions - Mood: normal  Emotional tone and reactions - Affect: appropriate  Mental trend/Content of thoughts - Perception: normal  Mental trend/Content of thoughts - Orientation: normal  Mental trend/Content of thoughts - Memory: normal  Mental trend/Content of thoughts - General knowledge: consistent with education  Insight: good  Judgment: good  Intelligence: high    Cynthia LITTIE Ponto, Cynthia [name]

## 2024-12-22 ENCOUNTER — Encounter: Payer: Self-pay | Admitting: Family Medicine

## 2024-12-22 ENCOUNTER — Ambulatory Visit (INDEPENDENT_AMBULATORY_CARE_PROVIDER_SITE_OTHER): Admitting: Family Medicine

## 2024-12-22 ENCOUNTER — Other Ambulatory Visit (HOSPITAL_COMMUNITY): Payer: Self-pay

## 2024-12-22 VITALS — BP 108/72 | HR 74 | Ht 62.0 in | Wt 169.0 lb

## 2024-12-22 DIAGNOSIS — R519 Headache, unspecified: Secondary | ICD-10-CM

## 2024-12-22 DIAGNOSIS — J40 Bronchitis, not specified as acute or chronic: Secondary | ICD-10-CM | POA: Diagnosis not present

## 2024-12-22 DIAGNOSIS — R7303 Prediabetes: Secondary | ICD-10-CM | POA: Diagnosis not present

## 2024-12-22 DIAGNOSIS — F900 Attention-deficit hyperactivity disorder, predominantly inattentive type: Secondary | ICD-10-CM | POA: Diagnosis not present

## 2024-12-22 DIAGNOSIS — E282 Polycystic ovarian syndrome: Secondary | ICD-10-CM | POA: Diagnosis not present

## 2024-12-22 DIAGNOSIS — E559 Vitamin D deficiency, unspecified: Secondary | ICD-10-CM | POA: Diagnosis not present

## 2024-12-22 DIAGNOSIS — F419 Anxiety disorder, unspecified: Secondary | ICD-10-CM

## 2024-12-22 DIAGNOSIS — F32A Depression, unspecified: Secondary | ICD-10-CM

## 2024-12-22 DIAGNOSIS — Z79899 Other long term (current) drug therapy: Secondary | ICD-10-CM

## 2024-12-22 DIAGNOSIS — J329 Chronic sinusitis, unspecified: Secondary | ICD-10-CM | POA: Diagnosis not present

## 2024-12-22 DIAGNOSIS — Z0000 Encounter for general adult medical examination without abnormal findings: Secondary | ICD-10-CM

## 2024-12-22 LAB — COMPREHENSIVE METABOLIC PANEL WITH GFR
ALT: 13 U/L (ref 3–35)
AST: 13 U/L (ref 5–37)
Albumin: 3.9 g/dL (ref 3.5–5.2)
Alkaline Phosphatase: 79 U/L (ref 39–117)
BUN: 17 mg/dL (ref 6–23)
CO2: 22 meq/L (ref 19–32)
Calcium: 8.3 mg/dL — ABNORMAL LOW (ref 8.4–10.5)
Chloride: 109 meq/L (ref 96–112)
Creatinine, Ser: 0.7 mg/dL (ref 0.40–1.20)
GFR: 105.29 mL/min
Glucose, Bld: 79 mg/dL (ref 70–99)
Potassium: 3.7 meq/L (ref 3.5–5.1)
Sodium: 138 meq/L (ref 135–145)
Total Bilirubin: 0.3 mg/dL (ref 0.2–1.2)
Total Protein: 6.8 g/dL (ref 6.0–8.3)

## 2024-12-22 LAB — LIPID PANEL
Cholesterol: 190 mg/dL (ref 28–200)
HDL: 45.6 mg/dL
LDL Cholesterol: 131 mg/dL — ABNORMAL HIGH (ref 10–99)
NonHDL: 144.21
Total CHOL/HDL Ratio: 4
Triglycerides: 67 mg/dL (ref 10.0–149.0)
VLDL: 13.4 mg/dL (ref 0.0–40.0)

## 2024-12-22 LAB — CBC WITH DIFFERENTIAL/PLATELET
Basophils Absolute: 0 K/uL (ref 0.0–0.1)
Basophils Relative: 0.3 % (ref 0.0–3.0)
Eosinophils Absolute: 0.1 K/uL (ref 0.0–0.7)
Eosinophils Relative: 2.2 % (ref 0.0–5.0)
HCT: 35.9 % — ABNORMAL LOW (ref 36.0–46.0)
Hemoglobin: 12 g/dL (ref 12.0–15.0)
Lymphocytes Relative: 27.1 % (ref 12.0–46.0)
Lymphs Abs: 1.7 K/uL (ref 0.7–4.0)
MCHC: 33.5 g/dL (ref 30.0–36.0)
MCV: 82.6 fl (ref 78.0–100.0)
Monocytes Absolute: 0.4 K/uL (ref 0.1–1.0)
Monocytes Relative: 6.5 % (ref 3.0–12.0)
Neutro Abs: 4 K/uL (ref 1.4–7.7)
Neutrophils Relative %: 63.9 % (ref 43.0–77.0)
Platelets: 375 K/uL (ref 150.0–400.0)
RBC: 4.35 Mil/uL (ref 3.87–5.11)
RDW: 14.6 % (ref 11.5–15.5)
WBC: 6.3 K/uL (ref 4.0–10.5)

## 2024-12-22 LAB — VITAMIN D 25 HYDROXY (VIT D DEFICIENCY, FRACTURES): VITD: 22.6 ng/mL — ABNORMAL LOW (ref 30.00–100.00)

## 2024-12-22 LAB — TSH: TSH: 1.71 u[IU]/mL (ref 0.35–5.50)

## 2024-12-22 LAB — VITAMIN B12: Vitamin B-12: 340 pg/mL (ref 211–911)

## 2024-12-22 LAB — HEMOGLOBIN A1C: Hgb A1c MFr Bld: 5.4 % (ref 4.6–6.5)

## 2024-12-22 MED ORDER — BACLOFEN 10 MG PO TABS
10.0000 mg | ORAL_TABLET | Freq: Every evening | ORAL | 0 refills | Status: AC | PRN
  Filled 2024-12-22: qty 90, 90d supply, fill #0

## 2024-12-22 MED ORDER — AZITHROMYCIN 250 MG PO TABS
ORAL_TABLET | ORAL | 0 refills | Status: AC
  Filled 2024-12-22: qty 6, 5d supply, fill #0

## 2024-12-22 MED ORDER — ALPRAZOLAM 0.5 MG PO TABS
0.5000 mg | ORAL_TABLET | Freq: Every evening | ORAL | 0 refills | Status: AC | PRN
  Filled 2024-12-22: qty 30, 30d supply, fill #0

## 2024-12-22 MED ORDER — PREDNISONE 5 MG PO TABS
ORAL_TABLET | ORAL | 0 refills | Status: AC
  Filled 2024-12-22: qty 42, 12d supply, fill #0

## 2024-12-22 NOTE — Progress Notes (Signed)
 "    Patient Care Team: Prentiss Frieze, DO as PCP - General (Family Medicine) Cris Burnard DEL, MD as Consulting Physician (Obstetrics and Gynecology) Associates, [facility]     Patient Care Team: Prentiss Frieze, DO as PCP - General (Family Medicine) Cris Burnard DEL, MD as Consulting Physician (Obstetrics and Gynecology) Associates, [facility]  Subjective   Cynthia [name] is a 44 y.o. female presenting for a comprehensive medical examination. She does not have additional problems to discuss today.   Review of Systems: Negative, with the exception of above mentioned in HPI.  History   Reviewed and updated by clinician on day of visit: allergies, medications, problem list, medical history, surgical history, family history, social history, and previous encounter notes.  Medications and Allergies   Show/hide medication list[1] Allergies[2]  Objective   BP 108/72 (BP Location: Right Arm, Cuff Size: Large)   Pulse 74   Ht 5' 2 (1.575 m)   Wt 169 lb (76.7 kg)   LMP 11/10/2024 (Exact Date)   SpO2 100%   BMI 30.91 kg/m   Physical Exam Vitals and nursing note reviewed.  HENT:     Head: Normocephalic and atraumatic.  Eyes:     Pupils: Pupils are equal, round, and reactive to light.  Cardiovascular:     Rate and Rhythm: Normal rate and regular rhythm.     Heart sounds: Normal heart sounds.  Pulmonary:     Effort: Pulmonary effort is normal.  Abdominal:     Palpations: Abdomen is soft.  Musculoskeletal:     Cervical back: Normal range of motion and neck supple.  Skin:    General: Skin is warm.  Psychiatric:        Behavior: Behavior normal.    Results for orders placed or performed in visit on 12/16/23  Hepatitis C Antibody   Collection Time: 12/16/23  2:52 PM  Result Value Ref Range   Hepatitis C Ab NON-REACTIVE NON-REACTIVE  Lipid panel   Collection Time: 12/16/23  2:52 PM  Result Value Ref Range   Cholesterol 189 0 - 200 mg/dL    Triglycerides 825.9 (H) 0.0 - 149.0 mg/dL   HDL 60.19 >60.99 mg/dL   VLDL 65.1 0.0 - 59.9 mg/dL   LDL Cholesterol 885 (H) 0 - 99 mg/dL   Total CHOL/HDL Ratio 5    NonHDL 149.23   Basic metabolic panel   Collection Time: 12/16/23  2:52 PM  Result Value Ref Range   Sodium 138 135 - 145 mEq/L   Potassium 4.4 3.5 - 5.1 mEq/L   Chloride 108 96 - 112 mEq/L   CO2 24 19 - 32 mEq/L   Glucose, Bld 86 70 - 99 mg/dL   BUN 14 6 - 23 mg/dL   Creatinine, Ser 9.29 0.40 - 1.20 mg/dL   GFR 893.95 >39.99 mL/min   Calcium  8.8 8.4 - 10.5 mg/dL  TSH   Collection Time: 12/16/23  2:52 PM  Result Value Ref Range   TSH 1.38 0.35 - 5.50 uIU/mL  Hepatic function panel   Collection Time: 12/16/23  2:52 PM  Result Value Ref Range   Total Bilirubin 0.2 0.2 - 1.2 mg/dL   Bilirubin, Direct 0.0 0.0 - 0.3 mg/dL   Alkaline Phosphatase 56 39 - 117 U/L   AST 12 0 - 37 U/L   ALT 8 0 - 35 U/L   Total Protein 6.6 6.0 - 8.3 g/dL   Albumin 3.7 3.5 - 5.2 g/dL  CBC with Differential/Platelet  Collection Time: 12/16/23  2:52 PM  Result Value Ref Range   WBC 8.5 4.0 - 10.5 K/uL   RBC 4.37 3.87 - 5.11 Mil/uL   Hemoglobin 11.9 (L) 12.0 - 15.0 g/dL   HCT 62.7 63.9 - 53.9 %   MCV 85.2 78.0 - 100.0 fl   MCHC 31.9 30.0 - 36.0 g/dL   RDW 85.9 88.4 - 84.4 %   Platelets 442.0 (H) 150.0 - 400.0 K/uL   Neutrophils Relative % 67.8 43.0 - 77.0 %   Lymphocytes Relative 24.4 12.0 - 46.0 %   Monocytes Relative 4.7 3.0 - 12.0 %   Eosinophils Relative 2.0 0.0 - 5.0 %   Basophils Relative 1.1 0.0 - 3.0 %   Neutro Abs 5.7 1.4 - 7.7 K/uL   Lymphs Abs 2.1 0.7 - 4.0 K/uL   Monocytes Absolute 0.4 0.1 - 1.0 K/uL   Eosinophils Absolute 0.2 0.0 - 0.7 K/uL   Basophils Absolute 0.1 0.0 - 0.1 K/uL  VITAMIN D  25 Hydroxy (Vit-D Deficiency, Fractures)   Collection Time: 12/16/23  2:52 PM  Result Value Ref Range   VITD 34.73 30.00 - 100.00 ng/mL    The 10-year ASCVD risk score (Arnett DK, et al., 2019) is: 0.9%   Values used to  calculate the score:     Age: 17 years     Clinically relevant sex: Female     Is Non-Hispanic African American: No     Diabetic: No     Tobacco smoker: No     Systolic Blood Pressure: 108 mmHg     Is BP treated: Yes     HDL Cholesterol: 45.6 mg/dL     Total Cholesterol: 190 mg/dL  Assessment and Plan   1. Annual physical exam   2. Anxiety and depression   3. PCOS (polycystic ovarian syndrome)   4. Attention deficit hyperactivity disorder (ADHD), predominantly inattentive type   5. Pre-diabetes   6. Vitamin D  deficiency   7. Medication management   8. Sinobronchitis   9. Frequent headaches    Orders Placed This Encounter  Procedures   CBC with Differential/Platelet   Comprehensive metabolic panel with GFR   Lipid panel   VITAMIN D  25 Hydroxy (Vit-D Deficiency, Fractures)   Vitamin B12   TSH   Hemoglobin A1c   Health Maintenance   Immunization History  Administered Date(s) Administered   HPV 9-valent 04/30/2022, 06/21/2022   Influenza Inj Mdck Quad Pf 10/26/2018   Influenza, Seasonal, Injecte, Preservative Fre 12/16/2023, 11/10/2024   Influenza,inj,Quad PF,6+ Mos 09/22/2014, 10/26/2018, 08/12/2019, 09/28/2020, 10/09/2021, 11/06/2022   Influenza-Unspecified 09/07/2015, 09/28/2017   PFIZER(Purple Top)SARS-COV-2 Vaccination 03/06/2020, 04/02/2020, 11/19/2020   PPD Test 03/17/2018, 03/24/2018   Tdap 03/10/2014, 05/21/2017   Zoster Recombinant(Shingrix ) 02/22/2022, 04/26/2022   Health Maintenance  Topic Date Due   Hepatitis B Vaccines 19-59 Average Risk (1 of 3 - 19+ 3-dose series) Never done   Cervical Cancer Screening (HPV/Pap Cotest)  01/17/2020   HPV VACCINES (3 - Risk 3-dose series) 10/31/2022   COVID-19 Vaccine (4 - 2025-26 season) 08/10/2024   Mammogram  01/17/2025   DTaP/Tdap/Td (3 - Td or Tdap) 05/22/2027   Influenza Vaccine  Completed   Hepatitis C Screening  Completed   HIV Screening  Completed   Pneumococcal Vaccine  Aged Out   Meningococcal B Vaccine   Aged Out   Screening   Flowsheet Row Office Visit from 11/10/2024 in [facility]  AUDIT-C Score 2  12/22/2024   11:01 AM 12/16/2023    2:18 PM 04/18/2023    1:28 PM  Depression screen PHQ 2/9  Decreased Interest 0 1 0  Down, Depressed, Hopeless 1 2 0  PHQ - 2 Score 1 3 0  Altered sleeping 2 2 0  Tired, decreased energy 2 3 0  Change in appetite 0 2 0  Feeling bad or failure about yourself  1 1 0  Trouble concentrating 1 1 0  Moving slowly or fidgety/restless 0 0 0  Suicidal thoughts 0 0 0  PHQ-9 Score 7 12  0   Difficult doing work/chores Not difficult at all Not difficult at all Not difficult at all     Data saved with a previous flowsheet row definition   Preventive Health Recommendations   Sexual Health  Use condoms consistently to reduce STI risk  Choose partners carefully  Use effective contraception to prevent unintended pregnancy  Learn more: savingpics.uy  Tobacco & Nicotine  Avoid smoking and all tobacco or nicotine products  Quit support available: https://smokefree.gov  Alcohol  Best option is no alcohol  If drinking, limit to <=7 drinks/week and <=3 drinks/occasion (women)  Higher intake increases health risks  Learn more: http://www.green.com/  Substance Use  Avoid illicit drug use  Confidential treatment resources available if needed  Help: identitylist.se  Nutrition & Weight Health (Obesity Medicine-Based)  Follow a whole-food, nutrient-dense eating pattern  Prioritize adequate protein to support muscle and metabolism  Emphasize vegetables, fruits, lean proteins, legumes, and healthy fats  Limit ultra-processed foods, added sugars, sugary beverages, and refined carbohydrates  Choose mostly unsaturated fats; limit saturated fats  Aim to meet fiber, calcium , potassium, and micronutrient needs through food  No single best diet--Mediterranean, DASH, and plant-forward  patterns can all be effective when individualized  Learn more: tonerproviders.com.cy  Physical Activity  Engage in regular physical activity appropriate for your abilities  Include aerobic activity, strength training, and flexibility  Progress gradually and focus on consistency  Exercise guidance: https://www.acsm.org/education-resources/trending-topics-resources/physical-activity-guidelines  Injury & Home Safety  Use seat belts and appropriate helmets  Maintain working smoke detectors  Avoid smoking near bedding or upholstered furniture  Oral Health  Brush teeth twice daily and floss regularly  Maintain routine dental visits  Learn more: carpetlickers.tn  Follow-Up  Next preventive physical recommended in 1 year  [name], DO, MS, FAAFP, Dipl. [facility] [address] [place], [address] Dept: [phone] Dept Fax: [phone]     [1]  Outpatient Medications Prior to Visit  Medication Sig   cetirizine (ZYRTEC) 10 MG tablet Take 10 mg by mouth daily.   diphenhydrAMINE  (BENADRYL ) 50 MG capsule Take 50 mg by mouth at bedtime as needed.   escitalopram  (LEXAPRO ) 20 MG tablet Take 1 tablet (20 mg total) by mouth daily.   lisdexamfetamine  (VYVANSE ) 20 MG capsule Take 1 capsule (20 mg total) by mouth daily in the afternoon. Take in addition to 40 mg in the morning.   lisdexamfetamine  (VYVANSE ) 40 MG capsule Take 1 capsule (40 mg total) by mouth every morning.   losartan  (COZAAR ) 50 MG tablet Take 1 tablet (50 mg total) by mouth daily.   norethindrone -ethinyl estradiol  (LOESTRIN ) 1-20 MG-MCG tablet Take 1 tablet by mouth daily.   spironolactone  (ALDACTONE ) 50 MG tablet Take 1 tablet (50 mg total) by mouth daily.   tirzepatide  (ZEPBOUND ) 15 MG/0.5ML Pen Inject 15 mg into the skin once a week.   topiramate  ER (QUDEXY  XR) 100 MG CS24  sprinkle capsule Take 1 capsule (100 mg total) by  mouth daily.   Vitamin D , Ergocalciferol , (DRISDOL ) 1.25 MG (50000 UNIT) CAPS capsule Take 1 capsule (50,000 Units total) by mouth every 7 (seven) days.   [DISCONTINUED] ALPRAZolam  (XANAX ) 0.5 MG tablet Take 1 tablet (0.5 mg total) by mouth at bedtime as needed for anxiety.   [DISCONTINUED] baclofen  (LIORESAL ) 10 MG tablet Take 1 tablet (10 mg total) by mouth at bedtime as needed for muscle spasms.   [DISCONTINUED] fluorometholone  (FML) 0.1 % ophthalmic suspension Place 1 drop into both eyes 4 (four) times daily. Use for 7 days then stop.   [DISCONTINUED] moxifloxacin  (VIGAMOX ) 0.5 % ophthalmic solution Place 1 drop into both eyes 4 (four) times daily. Use for 7 days then stop.   fluticasone  (FLONASE ) 50 MCG/ACT nasal spray PLACE 2 SPRAYS INTO BOTH NOSTRILS DAILY. (Patient not taking: Reported on 12/16/2024)   No facility-administered medications prior to visit.  [2] No Known Allergies  "

## 2024-12-29 ENCOUNTER — Ambulatory Visit: Payer: Self-pay | Admitting: Family Medicine

## 2025-01-04 ENCOUNTER — Other Ambulatory Visit: Payer: Self-pay

## 2025-01-04 ENCOUNTER — Other Ambulatory Visit (HOSPITAL_COMMUNITY): Payer: Self-pay

## 2025-01-04 ENCOUNTER — Ambulatory Visit: Admitting: Clinical

## 2025-01-04 DIAGNOSIS — F4323 Adjustment disorder with mixed anxiety and depressed mood: Secondary | ICD-10-CM | POA: Diagnosis not present

## 2025-01-04 NOTE — Progress Notes (Signed)
 Diagnosis: F43.23 Time: 1:00 pm-1:56 pm CPT: 09162E-04  Cynthia [name] was seen remotely using secure video conferencing. She was in her home and the therapist was in her office at the time of the appointment. Client is aware of risks of telehealth and consented to a virtual visit. Session focused  on issues that had been ongoing with her daughter's school. Therapist offered an opportunity to process, engaging Claressa in exploring her options. She is scheduled to be seen again in two weeks.  Treatment Plan Client Abilities/Strengths  [name] is seeking CBT to help her cope with her daughter's ASD diagnosis and pursue appropriate treatments. She shared that she is also interested in regaining motivation to achieve previous levels of discipline in her lifestyle.  Client Treatment Preferences  [name] prefers remote appointments that occur while her children are in school, between 9:30am-12:00am  Client Statement of Needs  [name] is seeking CBT to help her cope with and navigate her daughter's diagnosis, as well as establishing and maintaining a self-care routine  Treatment Level  Monthly  Symptoms  Anxiety: sleep irregularities, excessive worry (Status: maintained). Depression: frequent headaches, weight gain, fatigue, sleep difficulties (Status: maintained).  Problems Addressed  New Description, New Description  Goals 1. Cynthia [name] has struggled to maintain self care in recent years Objective [name] would like to increase her understanding of her daughters in order to better address their needs Target Date: 2025-09-08 Frequency: Monthly  Progress: 70% one daughter, 25% other daughter Modality: individual  Related Interventions Therapist will incorporate manualized treatments including Parent Training for Disruptive Behaviors in ASD  Therapist will provide referrals for additional resources as appropriate  Objective [name] would like to establish a consistent self care routine  Target Date: 2025-09-08  Frequency: Monthly  Progress: 30 Modality: individual  Related Interventions Therapist will incorporate CBT based strategies to help Cynthia [name] identify and disengage from maladaptive thoughts and behaviors [name] will be provided opportunities to process experiences in session 2. [name] would like to increase self-accountability toward accomplishing set tasks Target date: 09/08/2025 Progress: 30%  3. Cynthia [name] will process traumatic experiences from her past and consider how these experiences may continue to impact her currently.  Diagnosis Axis none 309.28 (Adjustment disorder with mixed anxiety and depressed mood) - Open - [Signifier: n/a]    Conditions For Discharge Achievement of treatment goals and objectives  ntake Presenting Problem Cynthia [name] presented seeking CBT to cope with her daughter's ASD diagnosis and navigate next steps in her care.  Symptoms excessive worry, weight gain, sleep irregularities, fatigue History of Problem  Cynthia [name]'s 62-year-old daughter Cynthia [name] was diagnosed with ASD in spring of 2022, and her twin daughter Cynthia [name] was diagnosed the following year. She shared that it has been challenging to coordinate appropriate care and services for them.  Recent Trigger  [name]'s daughter was diagnosed with ASD in spring of 2022.  Marital and Family Information  Present family concerns/problems: None reported, although Cynthia [name] would like more 1:1 time with her husband  Strengths/resources in the family/friends: Cynthia [name] described a great relationship with her husband  Marital/sexual history patterns: Cynthia [name] has been married for 10 years and has been in a relationship with her husband for 23 years  Family of Origin  Problems in family of origin: Cynthia [name] shared that she has very few memories of her child until she was in the end of elementary school. Her family moved to [place]  from [place] when she was 44 years of age. She shared that she was cared for primarily by a nanny in early  childhood, as is typical of [place] culture, and her father traveled frequently for work.  Family background / ethnic factors: Cynthia [name] is from [place]  No needs/concerns related to ethnicity reported when asked: No  Education/Vocation  Interpersonal concerns/problems: Mazelle shared that she feels isolated since having children and social restrictions during the COVID19 pandemic. She moved to [facility] for her husband's job in 2014 and she shared that she does not have a significant support system in the area outside of two close friends who live in [place].  Personal strengths: Cynthia [name] does not hold on to grudges and described herself as easy going.  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  Leisure Activities/Daily Functioning  gave up  Legal Status  No Legal Problems  Substance use/abuse/dependence  unspecified  Comments: Weight gain-Cynthia [name] has participated the [facility] weight management program  Religion/Spirituality  Not Reported  Other  General Behavior: cooperative  Attire: appropriate  Gait: Not observed-telehealth  Motor Activity: normal  Stream of Thought - Productivity: spontaneous  Stream of thought - Progression: normal  Stream of thought - Language: normal  Emotional tone and reactions - Mood: normal  Emotional tone and reactions - Affect: appropriate  Mental trend/Content of thoughts - Perception: normal  Mental trend/Content of thoughts - Orientation: normal  Mental trend/Content of thoughts - Memory: normal  Mental trend/Content of thoughts - General knowledge: consistent with education  Insight: good  Judgment: good  Intelligence: high      Cynthia LITTIE Ponto, PhD      Cynthia LITTIE Ponto, PhD               Cynthia [name], PhDDiagnosis: Q56.76 Time: 1:00 pm-2:00 pm CPT:  09162E-04  Cynthia [name] was seen remotely using secure video conferencing. She was in her home and the therapist was in her office at the time of the appointment. Client is aware of risks of telehealth and consented to a virtual visit. Session focused on recent stressors, which included caring for her mother and mother in law, and a challenging conversation with her husband. Therapist offered validation and support, and suggested communication strategies. Millette is scheduled to be seen again in one month, and therapist will reach out as cancellations happen.  Treatment Plan Client Abilities/Strengths  Graci is seeking CBT to help her cope with her daughter's ASD diagnosis and pursue appropriate treatments. She shared that she is also interested in regaining motivation to achieve previous levels of discipline in her lifestyle.  Client Treatment Preferences  Kahlie prefers remote appointments that occur while her children are in school, between 9:30am-12:00am  Client Statement of Needs  Decie is seeking CBT to help her cope with and navigate her daughter's diagnosis, as well as establishing and maintaining a self-care routine  Treatment Level  Monthly  Symptoms  Anxiety: sleep irregularities, excessive worry (Status: maintained). Depression: frequent headaches, weight gain, fatigue, sleep difficulties (Status: maintained).  Problems Addressed  New Description, New Description  Goals 1. Kennis has struggled to maintain self care in recent years Objective Kammie would like to increase her understanding of her daughters in order to better address their needs Target Date: 2025-09-08 Frequency: Monthly  Progress: 70% one daughter, 25% other daughter Modality: individual  Related Interventions Therapist will incorporate manualized treatments including Parent Training for Disruptive Behaviors in ASD  Therapist will provide referrals for additional resources  as appropriate  Objective Carolynn would like  to establish a consistent self care routine  Target Date: 2025-09-08 Frequency: Monthly  Progress: 30 Modality: individual  Related Interventions Therapist will incorporate CBT based strategies to help Oluwademilade identify and disengage from maladaptive thoughts and behaviors Nichol will be provided opportunities to process experiences in session 2. Josseline would like to increase self-accountability toward accomplishing set tasks Target date: 09/08/2025 Progress: 30%  3. Demitria will process traumatic experiences from her past and consider how these experiences may continue to impact her currently.  Diagnosis Axis none 309.28 (Adjustment disorder with mixed anxiety and depressed mood) - Open - [Signifier: n/a]    Conditions For Discharge Achievement of treatment goals and objectives  ntake Presenting Problem Sherrilyn presented seeking CBT to cope with her daughter's ASD diagnosis and navigate next steps in her care.  Symptoms excessive worry, weight gain, sleep irregularities, fatigue History of Problem  Cynthia [name]'s 61-year-old daughter Cynthia [name] was diagnosed with ASD in spring of 2022, and her twin daughter Cynthia [name] was diagnosed the following year. She shared that it has been challenging to coordinate appropriate care and services for them.  Recent Trigger  Kahdijah's daughter was diagnosed with ASD in spring of 2022.  Marital and Family Information  Present family concerns/problems: None reported, although Kaitlynd would like more 1:1 time with her husband  Strengths/resources in the family/friends: Myeisha described a great relationship with her husband  Marital/sexual history patterns: Naliyah has been married for 10 years and has been in a relationship with her husband for 23 years  Family of Origin  Problems in family of origin: Emiah shared that she has very few memories of her child until she was in the end of elementary school. Her family moved to [place]  from [place] when she was 44 years of age.  She shared that she was cared for primarily by a nanny in early childhood, as is typical of [place] culture, and her father traveled frequently for work.  Family background / ethnic factors: Ragan is from [place]  No needs/concerns related to ethnicity reported when asked: No  Education/Vocation  Interpersonal concerns/problems: Ryenne shared that she feels isolated since having children and social restrictions during the COVID19 pandemic. She moved to [facility] for her husband's job in 2014 and she shared that she does not have a significant support system in the area outside of two close friends who live in [place].  Personal strengths: Nataleah does not hold on to grudges and described herself as easy going.  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  Leisure Activities/Daily Functioning  gave up  Legal Status  No Legal Problems  Substance use/abuse/dependence  unspecified  Comments: Weight gain-Jahanna has participated the [facility] weight management program  Religion/Spirituality  Not Reported  Other  General Behavior: cooperative  Attire: appropriate  Gait: Not observed-telehealth  Motor Activity: normal  Stream of Thought - Productivity: spontaneous  Stream of thought - Progression: normal  Stream of thought - Language: normal  Emotional tone and reactions - Mood: normal  Emotional tone and reactions - Affect: appropriate  Mental trend/Content of thoughts - Perception: normal  Mental trend/Content of thoughts - Orientation: normal  Mental trend/Content of thoughts - Memory: normal  Mental trend/Content of thoughts - General knowledge: consistent with education  Insight: good  Judgment: good  Intelligence: high  Renisha Cockrum L Daanya Lanphier, PhD               Cynthia LITTIE Ponto, PhD

## 2025-01-05 ENCOUNTER — Other Ambulatory Visit: Payer: Self-pay

## 2025-01-07 ENCOUNTER — Other Ambulatory Visit (HOSPITAL_COMMUNITY): Payer: Self-pay

## 2025-01-07 MED ORDER — LO LOESTRIN FE 1 MG-10 MCG / 10 MCG PO TABS
1.0000 | ORAL_TABLET | Freq: Every day | ORAL | 4 refills | Status: AC
  Filled 2025-01-07: qty 84, 84d supply, fill #0

## 2025-01-18 ENCOUNTER — Ambulatory Visit: Admitting: Clinical

## 2025-02-01 ENCOUNTER — Ambulatory Visit: Admitting: Clinical

## 2025-03-01 ENCOUNTER — Ambulatory Visit: Admitting: Clinical
